# Patient Record
Sex: Male | Born: 1965 | Race: White
Health system: Northeastern US, Academic
[De-identification: ages and names within clinical notes are randomized; demographics above are authoritative.]

## PROBLEM LIST (undated history)

## (undated) DIAGNOSIS — J45909 Unspecified asthma, uncomplicated: Secondary | ICD-10-CM

## (undated) DIAGNOSIS — F32A Depression, unspecified: Secondary | ICD-10-CM

## (undated) DIAGNOSIS — Z9989 Dependence on other enabling machines and devices: Secondary | ICD-10-CM

## (undated) DIAGNOSIS — G4730 Sleep apnea, unspecified: Secondary | ICD-10-CM

## (undated) DIAGNOSIS — Z8711 Personal history of peptic ulcer disease: Secondary | ICD-10-CM

## (undated) DIAGNOSIS — F319 Bipolar disorder, unspecified: Secondary | ICD-10-CM

## (undated) DIAGNOSIS — G5600 Carpal tunnel syndrome, unspecified upper limb: Secondary | ICD-10-CM

## (undated) DIAGNOSIS — R519 Headache, unspecified: Secondary | ICD-10-CM

## (undated) DIAGNOSIS — I10 Essential (primary) hypertension: Secondary | ICD-10-CM

## (undated) HISTORY — DX: Depression, unspecified: F32.A

## (undated) HISTORY — DX: Personal history of peptic ulcer disease: Z87.11

## (undated) HISTORY — DX: Headache, unspecified: R51.9

## (undated) HISTORY — DX: Carpal tunnel syndrome, unspecified upper limb: G56.00

---

## 2004-10-27 ENCOUNTER — Encounter: Payer: Self-pay | Admitting: Cardiology

## 2006-12-31 ENCOUNTER — Encounter: Payer: Self-pay | Admitting: Cardiovascular Disease

## 2007-11-28 DIAGNOSIS — J45909 Unspecified asthma, uncomplicated: Secondary | ICD-10-CM | POA: Insufficient documentation

## 2007-11-28 DIAGNOSIS — E039 Hypothyroidism, unspecified: Secondary | ICD-10-CM | POA: Insufficient documentation

## 2007-11-28 DIAGNOSIS — F309 Manic episode, unspecified: Secondary | ICD-10-CM | POA: Insufficient documentation

## 2008-07-11 ENCOUNTER — Encounter: Payer: Self-pay | Admitting: Cardiology

## 2008-12-08 DIAGNOSIS — G8929 Other chronic pain: Secondary | ICD-10-CM | POA: Insufficient documentation

## 2009-02-11 DIAGNOSIS — F17200 Nicotine dependence, unspecified, uncomplicated: Secondary | ICD-10-CM | POA: Insufficient documentation

## 2009-02-11 DIAGNOSIS — K219 Gastro-esophageal reflux disease without esophagitis: Secondary | ICD-10-CM | POA: Insufficient documentation

## 2009-03-13 DIAGNOSIS — M542 Cervicalgia: Secondary | ICD-10-CM | POA: Insufficient documentation

## 2009-03-13 DIAGNOSIS — R0602 Shortness of breath: Secondary | ICD-10-CM | POA: Insufficient documentation

## 2009-04-02 DIAGNOSIS — M25559 Pain in unspecified hip: Secondary | ICD-10-CM | POA: Insufficient documentation

## 2009-08-06 DIAGNOSIS — M549 Dorsalgia, unspecified: Secondary | ICD-10-CM | POA: Insufficient documentation

## 2009-09-03 ENCOUNTER — Ambulatory Visit
Admit: 2009-09-03 | Discharge: 2009-09-03 | Disposition: A | Discharge status: Home / Self Care | Payer: Self-pay | Source: Ambulatory Visit | Attending: Primary Care | Admitting: Primary Care

## 2009-09-18 ENCOUNTER — Ambulatory Visit
Admit: 2009-09-18 | Discharge: 2009-09-18 | Disposition: A | Discharge status: Home / Self Care | Payer: Self-pay | Source: Ambulatory Visit | Attending: Primary Care | Admitting: Primary Care

## 2009-09-22 ENCOUNTER — Ambulatory Visit: Payer: Self-pay | Admitting: Orthopedic Surgery

## 2009-09-22 ENCOUNTER — Encounter: Payer: Self-pay | Admitting: Cardiology

## 2009-10-02 ENCOUNTER — Ambulatory Visit: Payer: Self-pay | Admitting: Primary Care

## 2009-10-09 ENCOUNTER — Ambulatory Visit
Admit: 2009-10-09 | Disposition: A | Discharge status: Home / Self Care | Payer: Self-pay | Source: Ambulatory Visit | Attending: Orthopedic Surgery | Admitting: Orthopedic Surgery

## 2009-10-13 ENCOUNTER — Ambulatory Visit: Payer: Self-pay | Admitting: Primary Care

## 2009-10-13 LAB — SURGICAL PATHOLOGY

## 2009-10-20 ENCOUNTER — Ambulatory Visit: Payer: Self-pay | Admitting: Orthopedic Surgery

## 2009-10-22 NOTE — Progress Notes (Signed)
 HISTORY OF PRESENTING ILLNESS: Mr. Timothy French returns for a planned postop  visit.  He is now 2 weeks status post exploration left iliac crest bone  graft harvest site with excision of heterotopic ossification.  Overall, he  reports it is too early to note any significant improvement.  He has been  having a great deal of pain.  He is using hydrocodone 10/325 up to 8 to 10  tablets daily.  He reports quite a bit of bleeding initially upon discharge  from the hospital requiring frequent dressing changes.  The drainage has  since slowed down over the past 2 days.  He denies any fevers, chills, or  sweats.  The drainage was bloody, and he denies any purulent or foul  smelling  material.    PHYSICAL EXAMINATION: His incision is well approximated.  There is a small  amount of erythema but no drainage and no suggestion of infection or  cellulitis.    PLAN: Given the erythema, I have placed the patient on a short course of  Keflex.  He was given a refill of his hydrocodone 10/325 #240.  He will  follow up with Dr. Isabell Jarvis as planned in 1 month. No additional x-rays will  be required at that visit.                                                                   Dictated by:                                                             Lowella Grip,                                                             NP  Electronically Signed and Finalized by  Lowella Grip, NP 10/22/2009 12:50                                                                                                                       ___________________________________________                                                             Lowella Grip,  NP  DD:   10/20/2009  DT:   10/20/2009  6:11 P  RU/EAV#[id number]  [id number]    cc:   Madolyn Frieze, MD

## 2009-11-04 ENCOUNTER — Ambulatory Visit: Payer: Self-pay | Admitting: Primary Care

## 2009-11-04 ENCOUNTER — Encounter: Payer: Self-pay | Admitting: Gastroenterology

## 2009-11-05 LAB — GRAM STAIN: Gram Stain: 0

## 2009-11-05 NOTE — Progress Notes (Addendum)
Reason For Visit   Timothy French is a 43 year old who presents to the office for follow-up for:   meds.  RN Intake Assessment   --PAIN: Patient  acknowledges pain in the last week.       --If yes, 0-10 pain rating: 7        --If pain rating > 3; location:hips       --Duration of pain: 1 month        --Aggravating factors: stairs, laying on it        --Relieving factors: denies.  HPI   Iliac Crest Pain  --Still having pain over left ilica crest.  Pain present for more than 1   year but new pain present for approx 1 month since surgery  --reports pain located at incisional site  --Incision site prev red with yellowish discharge.  Treated with keflex -   erythema improved but not resolved.   Yellow discharge improved.  Still has   a few keflex left  --No associated fevers  --Pain character -numb.     --Reports numbness in lower back - radiate to iliac crest region  --Overall, pain somewhat improved  --using vicodin - 2-3tabs/day\  --reports that vicodin prescribed by surgery was recently 'lost'---     Medications reviewed  .  Allergies   Bee sting  Latex-asked/denied  No Known Drug Allergy.  Current Meds   Divalproex Sodium 500 MG Tablet Delayed Release;TAKE 2 TABLETS IN AM, 3   TABLETS IN EVENING--pt. gets through [facility]; Rx  Mirtazapine 30 MG Tablet;TAKE 1 TABLET AT BEDTIME--gets through [employer]; Rx  Risperidone 2 MG Tablet;TAKE 1 TABLET DAILY--gets through [facility]; Rx  SEROquel 300 MG Tablet;Take 2 tablets at bedtime, MDD=2 gets through [employer]; Rx  NexIUM 20 MG Capsule Delayed Release;TAKE 1 CAPSULE ONCE DAILY.; Rx  Ventolin HFA 108 (90 Base) MCG/ACT Aerosol Solution;INHALE 2 PUFFS EVERY 4   HOURS AS NEEDED FOR COUGH AND WHEEZE.; Rx  Levothyroxine Sodium 75 MCG Tablet;TAKE 1 TABLET DAILY.; Rx  Lisinopril 10 MG Tablet;TAKE 1 TABLET DAILY.; Rx  Meloxicam 7.5 MG Tablet;take 1 tablet by mouth twice a day if needed DO NOT   TAKE WITH NAPROXEN, IBUPROFEN OR ALEVE.; Rx  Cyclobenzaprine HCl  10 MG Tablet;TAKE 1 TABLET QHS AS NEEDED for muscle   spasms; Rx  Hydrocodone-Acetaminophen 10-325 MG Tablet;TAKE 1 TABLET TWICE DAILY PRN   pain; Rx pain  Hydrocodone-Acetaminophen 10-325 MG Tablet;1 - 2 tablets every 6 hours as   needed for pain MDD 8/ day; Rx  1-Medication Reconciliation;done; Rx.  Active Problems   Asthma (493.90)  Backache (724.5)  Benign Essential Hypertension (401.1)  Bipolar Disorder (296.00)  Cervical Vertebral Fusion  Cervicalgia (723.1)  Chronic Pain (338.29)  Difficulty Breathing (Dyspnea) (786.09)  Esophageal Reflux (530.81)  Hypothyroidism (244.9)  Joint Pain, Localized In The Hip (719.45)  Nicotine Dependence (305.1).  Vital Signs   Recorded by Greer,Megan on 04 Nov 2009 09:54 AM  BP:128/77,   HR: 101 b/min,   Weight: 177 lb.  Physical Exam   Well appearing.  no distress  Illiac crest - Incision clear and intact, healing well.  Small amount of   sebaceous, yellow material expressed from near incision site.  No warmth.  Assessment/Plan   1) Cellulitis/Wound Infection - Improving on abx  --cont abx  --culture from wound sent     2) Iliac Crest pain - hypertrophic bony formation s/p excision.  --Restart NSAIDS, meloxicam  --  f/u with ortho  --wean opioids     3) Pain Meds - Pain meds were in car that was recently towed.  Unable to   get pain meds out of car.  We agreed that we will wean off opioids by   January  --SW consulted to help contact impound lot  --If unable to obtain meds, will need to file a police report     4) Back pain - lumbar muscular strain  --Restart NSAIDs  --PT.  Signature   Electronically signed by: Madolyn Frieze  M.D.; 11/04/2009 4:45 PM EST.

## 2009-11-08 LAB — AEROBIC CULTURE

## 2009-11-12 ENCOUNTER — Ambulatory Visit: Payer: Self-pay | Admitting: Primary Care

## 2009-11-18 ENCOUNTER — Ambulatory Visit: Payer: Self-pay | Admitting: Primary Care

## 2009-11-23 ENCOUNTER — Ambulatory Visit: Payer: Self-pay | Admitting: Orthopedic Surgery

## 2010-03-18 ENCOUNTER — Ambulatory Visit: Payer: Self-pay | Admitting: Primary Care

## 2010-03-18 DIAGNOSIS — G5600 Carpal tunnel syndrome, unspecified upper limb: Secondary | ICD-10-CM | POA: Insufficient documentation

## 2010-03-18 LAB — COMPREHENSIVE METABOLIC PANEL
ALT: 20 U/L (ref 0–50)
AST: 20 U/L (ref 0–50)
Albumin: 4.5 g/dL (ref 3.5–5.2)
Alk Phos: 58 U/L (ref 40–130)
Anion Gap: 12 (ref 7–16)
Bilirubin,Total: 0.2 mg/dL (ref 0.0–1.2)
CO2: 25 mmol/L (ref 20–28)
Calcium: 8.9 mg/dL — ABNORMAL LOW (ref 9.0–10.3)
Chloride: 103 mmol/L (ref 96–108)
Creatinine: 1.28 mg/dL — ABNORMAL HIGH (ref 0.67–1.17)
GFR,Black: >59 *
GFR,Caucasian: >59 *
Glucose: 78 mg/dL (ref 74–106)
Lab: 20 mg/dL (ref 6–20)
Potassium: 4.2 mmol/L (ref 3.3–5.1)
Sodium: 140 mmol/L (ref 133–145)
Total Protein: 7.5 g/dL (ref 6.3–7.7)

## 2010-03-18 LAB — TSH: TSH: 2.59 u[IU]/mL (ref 0.27–4.20)

## 2010-03-18 LAB — T4, FREE: Free T4: 1.5 ng/dL (ref 0.9–1.7)

## 2010-03-18 LAB — VALPROIC ACID LEVEL, TOTAL: Valproic Acid: 84 ug/mL (ref 50–100)

## 2010-03-18 NOTE — Progress Notes (Addendum)
Reason For Visit   Timothy French is a 44 year old who presents to the office with complaints   of: Carpel Tunnel bothering him on both hands has beeen dropping things.  HPI   1) Wrist Pain  --Both hands going numb  --radiates up to elbow  --pain -- described as tingling and numbness  --4th, 5ht, and thumb are going number  --Decreased strength in both hands  --Had carpal tunnel in rt hand -- pt has had 2 surgery /releases  --Wore braces in past without help     2) ETOH  --no ETOH   --No other drugs  --spent past 6 months in jail for DWI     3) Pain near hip  --still has occ pain near incision  --pain with direct pressure     medications reviewed      .  Current Meds   Divalproex Sodium 500 MG Tablet Delayed Release;TAKE 2 TABLETS IN AM, 3   TABLETS IN EVENING--pt. gets through [facility]; Rx  Mirtazapine 30 MG Tablet;TAKE 1 TABLET AT BEDTIME--gets through [employer]; Rx  Risperidone 2 MG Tablet;TAKE 1 TABLET DAILY--gets through [facility]; Rx  NexIUM 20 MG Capsule Delayed Release;TAKE 1 CAPSULE ONCE DAILY.; Rx  Levothyroxine Sodium 75 MCG Tablet;TAKE 1 TABLET DAILY.; Rx  Meloxicam 7.5 MG Tablet;take 1 tablet by mouth twice a day if needed DO NOT   TAKE WITH NAPROXEN, IBUPROFEN OR ALEVE.; Rx  1-Medication Reconciliation;done; Rx  Sulfamethoxazole-TMP DS 800-160 MG Tablet;TAKE 1 TABLET TWICE DAILY.; Rx  Hydrocodone-Acetaminophen 10-325 MG Tablet;1 - 2 tablets every 6 hours as   needed for pain MDD 8/ day; Rx  Cyclobenzaprine HCl 10 MG Tablet;TAKE 1 TABLET QHS AS NEEDED for muscle   spasms; Rx  Hydrocodone-Acetaminophen 10-325 MG Tablet;TAKE 1 TABLET TWICE DAILY PRN   pain; Rx pain  Ventolin HFA 108 (90 Base) MCG/ACT Aerosol Solution;INHALE 2 PUFFS EVERY 4   HOURS AS NEEDED FOR COUGH AND WHEEZE.; Rx  SEROquel 300 MG Tablet;Take 2 tablets at bedtime, MDD=2 gets through [employer]; Rx  Lisinopril 10 MG Tablet;TAKE 1 TABLET DAILY.; Rx.  Active Problems   Asthma (493.90)  Backache (724.5)  Benign  Essential Hypertension (401.1)  Bipolar Disorder (296.00)  Cervical Vertebral Fusion  Cervicalgia (723.1)  Chronic Pain (338.29)  Difficulty Breathing (Dyspnea) (786.09)  Esophageal Reflux (530.81)  Hypothyroidism (244.9)  Joint Pain, Localized In The Hip (719.45)  Nicotine Dependence (305.1).  Vital Signs   Recorded by Mosie Epstein on 18 Mar 2010 08:17 AM  BP:136/72,   HR: 88 b/min,   Weight: 213 lb.  Physical Exam   Well appearing.  No distress  EXT -- no deformities.  5/5 strenght bilaterally in hands.  No wasting of   thenar or hypothenar emminence.  tinnels causes tingling in central palm   but not in fingers.   phalen negative.  nl ROM.  Assessment/Plan   1) Wrist pain -- concerning for possible peripheral neuropathy, although   not entirely expalined by median nerv compression  --check EMG  --Check thyroid studies  --cont levothyroxine  --cont neurontin  --check valproic acid level     2) Hip pain -- improving  --NSAID prn     3) ETOH -- discusses ETOH use.     4) Endo -- check tsh.  cont levothyroxine.  Signature   Electronically signed by: Madolyn Frieze  M.D.; 03/18/2010 8:43 AM EST.  Electronically signed by: Madolyn Frieze  M.D.; 03/18/2010 9:36  AM EST.

## 2010-04-08 ENCOUNTER — Ambulatory Visit: Payer: Self-pay | Admitting: Primary Care

## 2010-04-08 NOTE — Progress Notes (Signed)
Reason For Visit   Timothy French is a 44 year old who presents to the office for follow-up.  HPI   Wrist Pain  --PResent for months  --located in wrists and hands bilaterally  --Pain described as tingling and numbness   --radiates up to elbow along forearm  --Sx associated wtih some desreased strenght in wrists  --no injuries  --pmhx notable for 2 surgeical CT releases  --Wearing wrist braces at night  --no neck pain or radiation of pain from neck      medications reviewed      .  Allergies   Bee sting  Latex-asked/denied  No Known Drug Allergy.  Current Meds   Divalproex Sodium 500 MG Tablet Delayed Release;TAKE 2 TABLETS IN AM, 3   TABLETS IN EVENING--pt. gets through [facility]; Rx  Risperidone 2 MG Tablet;TAKE 1 TABLET DAILY--gets through [facility]; Rx  Levothyroxine Sodium 75 MCG Tablet;TAKE 1 TABLET DAILY.; Rx  Cyclobenzaprine HCl 10 MG Tablet;TAKE 1 TABLET QHS AS NEEDED for muscle   spasms; Rx  Lisinopril 10 MG Tablet;TAKE 1 TABLET DAILY.; Rx  Meloxicam 7.5 MG Tablet;take 1 tablet by mouth twice a day if needed DO NOT   TAKE WITH NAPROXEN, IBUPROFEN OR ALEVE.; Rx  1-Medication Reconciliation;done; Rx  Ventolin HFA 108 (90 Base) MCG/ACT Aerosol Solution;INHALE 2 PUFFS EVERY 4   HOURS AS NEEDED FOR COUGH AND WHEEZE.; Rx  SEROquel 300 MG Tablet;Take 2 tablets at bedtime, MDD=2 gets through [employer]; Rx  Mirtazapine 30 MG Tablet;TAKE 1 TABLET AT BEDTIME--gets through [employer]; Rx.  Active Problems   Asthma (493.90)  Backache (724.5)  Benign Essential Hypertension (401.1)  Bipolar Disorder (296.00)  Carpal Tunnel Syndrome (354.0)  Cervical Vertebral Fusion  Cervicalgia (723.1)  Chronic Pain (338.29)  Difficulty Breathing (Dyspnea) (786.09)  Esophageal Reflux (530.81)  Hypothyroidism (244.9)  Joint Pain, Localized In The Hip (719.45)  Nicotine Dependence (305.1).  Vital Signs   Recorded by Lillard Anes on 08 Apr 2010 01:03 PM  BP:132/81,   HR: 101 b/min,   Weight: 213 lb.  Assessment/Plan      Wrist pain --  Most c/w peripheral neuropathy.    --check nerve conduction studies  --reviewed thyroid studies  --cont levothyroxine  --cont neurontin        .  Signature   Electronically signed by: Madolyn Frieze  M.D.; 04/08/2010 8:16 PM EST.

## 2010-04-12 ENCOUNTER — Encounter: Payer: Self-pay | Admitting: Neurology

## 2010-04-22 ENCOUNTER — Ambulatory Visit: Payer: Self-pay | Admitting: Primary Care

## 2010-04-29 ENCOUNTER — Encounter: Payer: Self-pay | Admitting: Neurology

## 2010-05-06 ENCOUNTER — Ambulatory Visit: Payer: Self-pay | Admitting: Primary Care

## 2010-08-09 ENCOUNTER — Other Ambulatory Visit: Payer: Self-pay | Admitting: Primary Care

## 2010-08-09 ENCOUNTER — Ambulatory Visit: Payer: Self-pay | Admitting: Primary Care

## 2010-08-09 LAB — CBC AND DIFFERENTIAL
Baso # K/uL: 0 THOU/uL (ref 0.0–0.1)
Basophil %: 0.4 % (ref 0.2–1.2)
Eos # K/uL: 0.2 THOU/uL (ref 0.0–0.5)
Eosinophil %: 1.7 % (ref 0.8–7.0)
Hematocrit: 46 % (ref 40–51)
Hemoglobin: 15.5 g/dL (ref 13.7–17.5)
Lymph # K/uL: 3.5 THOU/uL (ref 1.3–3.6)
Lymphocyte %: 37.2 % (ref 21.8–53.1)
MCV: 92 fL (ref 79–92)
Mono # K/uL: 0.9 THOU/uL — ABNORMAL HIGH (ref 0.3–0.8)
Monocyte %: 9.2 % (ref 5.3–12.2)
Neut # K/uL: 4.8 THOU/uL (ref 1.8–5.4)
Platelets: 298 THOU/uL (ref 150–330)
RBC: 5 MIL/uL (ref 4.6–6.1)
RDW: 13.5 % (ref 11.6–14.4)
Seg Neut %: 51.5 % (ref 34.0–67.9)
WBC: 9.4 THOU/uL — ABNORMAL HIGH (ref 4.2–9.1)

## 2010-08-09 LAB — COMPREHENSIVE METABOLIC PANEL
ALT: 18 U/L (ref 0–50)
AST: 26 U/L (ref 0–50)
Albumin: 4.4 g/dL (ref 3.5–5.2)
Alk Phos: 60 U/L (ref 40–130)
Anion Gap: 8 (ref 7–16)
Bilirubin,Total: 0.5 mg/dL (ref 0.0–1.2)
CO2: 28 mmol/L (ref 20–28)
Calcium: 9.7 mg/dL (ref 9.0–10.3)
Chloride: 103 mmol/L (ref 96–108)
Creatinine: 1.08 mg/dL (ref 0.67–1.17)
GFR,Black: >59 *
GFR,Caucasian: >59 *
Glucose: 70 mg/dL — ABNORMAL LOW (ref 74–106)
Lab: 8 mg/dL (ref 6–20)
Potassium: 4.5 mmol/L (ref 3.3–5.1)
Sodium: 139 mmol/L (ref 133–145)
Total Protein: 7.4 g/dL (ref 6.3–7.7)

## 2010-08-09 LAB — LIPASE: Lipase: 24 U/L (ref 13–60)

## 2010-08-09 NOTE — Progress Notes (Signed)
Reason For Visit   Timothy French is a 44 year old who presents to the office with complaints   of: vomiting, Diarrhea x 1 day.  HPI   1. GI- started having severe, nausea, vomiting and watery diarrhea in the   middle of the night. Has thrown up more than 10 times. Denies hematemesis   or bilious vomiting. Diarrhea has been watery but did have a few blood   streaks this morning after having to wipe multiple times. Has also had   associated RUQ pain but no hx of gallbladder disease. No report of pain   radiating to back or shoulder. Has been able to hold down minimal amounts   of clear liquids. Denies sick contacts or travel/camping hx. Has not eaten   at new restaurants and no new medication. Denies drug or alcohol use.     Medications reviewed.  Current Meds   Divalproex Sodium 500 MG Tablet Delayed Release;TAKE 2 TABLETS IN AM, 3   TABLETS IN EVENING--pt. gets through [facility]; Rx  Mirtazapine 30 MG Tablet;TAKE 1 TABLET AT BEDTIME--gets through [employer]; Rx  Risperidone 2 MG Tablet;TAKE 1 TABLET DAILY--gets through [facility]; Rx  Ventolin HFA 108 (90 Base) MCG/ACT Aerosol Solution;INHALE 2 PUFFS EVERY 4   HOURS AS NEEDED FOR COUGH AND WHEEZE.; Rx  Lisinopril 10 MG Tablet;TAKE 1 TABLET DAILY.; Rx  Levothyroxine Sodium 75 MCG Tablet;TAKE 1 TABLET DAILY.; Rx  Gabapentin 300 MG Capsule;TAKE 3 CAPSULEs in AM and 3 at BEDTIME; Rx  1-Medication Reconciliation;done; Rx  Cyclobenzaprine HCl 10 MG Tablet;TAKE 1 TABLET QHS AS NEEDED for muscle   spasms; Rx  Meloxicam 7.5 MG Tablet;take 1 tablet by mouth twice a day if needed DO NOT   TAKE WITH NAPROXEN, IBUPROFEN OR ALEVE.; Rx  SEROquel 300 MG Tablet;Take 2 tablets at bedtime, MDD=2 gets through [employer]; Rx.  Active Problems   Asthma (493.90)  Backache (724.5)  Benign Essential Hypertension (401.1)  Bipolar Disorder (296.00)  Carpal Tunnel Syndrome (354.0)  Cervical Vertebral Fusion  Cervicalgia (723.1)  Chronic Pain (338.29)  Difficulty Breathing  (Dyspnea) (786.09)  Esophageal Reflux (530.81)  Hypothyroidism (244.9)  Joint Pain, Localized In The Hip (719.45)  Nicotine Dependence (305.1).  Vital Signs   Recorded by [facility] on 09 Aug 2010 02:33 PM  BP:101/68,   HR: 104 b/min,   Temp: 98.2 F,   Height: 68 in, Weight: 193 lb, BMI: 29.3 kg/m2.  Physical Exam   GENERAL APPEARANCE: Appears tired but NAD  HEENT: PERRL, anicteric sclera, oropharynx moist  LUNGS: Clear to auscultation  HEART: Normal S1,S2 without murmur  ABDOMEN: NABS, soft, mild RUQ TTP but no G/R  .  Assessment/Plan   1. GI- probable viral gastroenteritis but concern with associated RUQ pain.   Cholelithiasis/cholelithiasis or PUD seems less likely but acute hepatitis   in differential. Will start with checking CBC, CMP and RUQ [place]. Will trial   Zofran for nausea. Pt aware he needs to go to ED if worsening pain or   inability to hold down fluids tonight. F/u with Dr. Jonny Ruiz in 1-2 days.  Signature   Electronically signed by: Marygrace Drought  MD Attend.; 08/09/2010 5:42 PM   EST; Chartered loss adjuster.

## 2010-08-11 ENCOUNTER — Emergency Department
Admit: 2010-08-11 | Disposition: A | Discharge status: Home / Self Care | Payer: Self-pay | Source: Ambulatory Visit | Attending: Emergency Medicine | Admitting: Emergency Medicine

## 2010-08-11 ENCOUNTER — Encounter: Payer: Self-pay | Admitting: Emergency Medicine

## 2010-08-11 HISTORY — DX: Unspecified asthma, uncomplicated: J45.909

## 2010-08-11 HISTORY — DX: Essential (primary) hypertension: I10

## 2010-08-11 HISTORY — DX: Bipolar disorder, unspecified: F31.9

## 2010-08-11 LAB — CBC AND DIFFERENTIAL
Baso # K/uL: 0 THOU/uL (ref 0.0–0.1)
Basophil %: 0.5 % (ref 0.2–1.2)
Eos # K/uL: 0.1 THOU/uL (ref 0.0–0.5)
Eosinophil %: 1.2 % (ref 0.8–7.0)
Hematocrit: 41 % (ref 40–51)
Hemoglobin: 14.2 g/dL (ref 13.7–17.5)
Lymph # K/uL: 2.6 THOU/uL (ref 1.3–3.6)
Lymphocyte %: 40.5 % (ref 21.8–53.1)
MCV: 91 fL (ref 79–92)
Mono # K/uL: 0.6 THOU/uL (ref 0.3–0.8)
Monocyte %: 8.5 % (ref 5.3–12.2)
Neut # K/uL: 3.2 THOU/uL (ref 1.8–5.4)
Platelets: 231 THOU/uL (ref 150–330)
RBC: 4.5 MIL/uL — ABNORMAL LOW (ref 4.6–6.1)
RDW: 13.5 % (ref 11.6–14.4)
Seg Neut %: 49 % (ref 34.0–67.9)
WBC: 6.5 THOU/uL (ref 4.2–9.1)

## 2010-08-11 LAB — COMPREHENSIVE METABOLIC PANEL
ALT: 12 U/L (ref 0–50)
AST: 15 U/L (ref 0–50)
Albumin: 4.1 g/dL (ref 3.5–5.2)
Alk Phos: 57 U/L (ref 40–130)
Anion Gap: 10 (ref 7–16)
Bilirubin,Total: 0.3 mg/dL (ref 0.0–1.2)
CO2: 25 mmol/L (ref 20–28)
Calcium: 9.4 mg/dL (ref 9.0–10.3)
Chloride: 102 mmol/L (ref 96–108)
Creatinine: 0.96 mg/dL (ref 0.67–1.17)
GFR,Black: >59 *
GFR,Caucasian: >59 *
Globulin: 2.7 g/dL (ref 2.7–4.3)
Glucose: 94 mg/dL (ref 74–106)
Lab: 8 mg/dL (ref 6–20)
Potassium: 4 mmol/L (ref 3.3–5.1)
Sodium: 137 mmol/L (ref 133–145)
Total Protein: 6.8 g/dL (ref 6.3–7.7)

## 2010-08-11 LAB — TYPE AND SCREEN
ABO RH Blood Type: O POS
Antibody Screen: NEGATIVE

## 2010-08-11 LAB — LIPASE: Lipase: 24 U/L (ref 13–60)

## 2010-08-11 LAB — AMYLASE: Amylase: 50 U/L (ref 28–100)

## 2010-08-11 MED ORDER — HYDROCODONE-ACETAMINOPHEN 5-500 MG PO TABS
1.0000 | ORAL_TABLET | Freq: Four times a day (QID) | ORAL | Status: AC | PRN
Start: 2010-08-11 — End: 2010-08-18

## 2010-08-11 MED ORDER — SODIUM CHLORIDE 0.9 % IV SOLN WRAPPED *I*
125.0000 mL/h | Status: DC
Start: 2010-08-11 — End: 2010-08-12

## 2010-08-11 MED ORDER — SODIUM CHLORIDE 0.9 % IV BOLUS *I*
1000.0000 mL | Status: DC
Start: 2010-08-11 — End: 2010-08-12
  Administered 2010-08-11: 1000 mL via INTRAVENOUS

## 2010-08-11 MED ORDER — ONDANSETRON 4 MG PO TBDP *I*
4.0000 mg | ORAL_TABLET | Freq: Three times a day (TID) | ORAL | Status: AC | PRN
Start: 2010-08-11 — End: 2010-08-18

## 2010-08-11 NOTE — ED Provider Notes (Signed)
History   Chief Complaint   Patient presents with   . GI Problem       HPI Comments: Vomiting diarrhea since Monday now with blood Bright red blood rectally Has recurrent vomiting When vomiting settled he tried a cheese burger which caused right upper quadrant pain and vomiting    Patient is a 44 y.o. male presenting with abdominal pain. The history is provided by the patient.   Abdominal Pain  The primary symptoms of the illness include abdominal pain, nausea, vomiting and diarrhea. The primary symptoms of the illness do not include fever, fatigue, shortness of breath, hematemesis, hematochezia or dysuria. Episode onset: days. The onset of the illness was gradual. The problem has been gradually worsening.   Onset: days. The pain came on gradually. The abdominal pain has been gradually worsening since its onset. The abdominal pain is located in the RUQ. The abdominal pain does not radiate. The severity of the abdominal pain is 3/10. Nothing relieves the abdominal pain. The abdominal pain is exacerbated by eating.   Associated With: fatty food. Additional symptoms associated with the illness include anorexia. Symptoms associated with the illness do not include chills, diaphoresis, heartburn, constipation, urgency, hematuria, frequency or back pain. Significant associated medical issues do not include PUD, GERD, inflammatory bowel disease, diabetes, sickle cell disease, gallstones, liver disease, substance abuse, diverticulitis, HIV or cardiac disease.       Past Medical History   Diagnosis Date   . Bipolar affective    . Asthma    . Hypertension        Past Surgical History   Procedure Date   . Back surgery      cervical fusion       Family History   Problem Relation Age of Onset   . Other Paternal Grandfather      mental illness   . Hypertension Other    . Mental illness Other    . Other Other      thyroid        reports that he has been smoking.  He does not have any smokeless tobacco history on file.  He reports  that he does not currently drink alcohol.    Review of Systems   Review of Systems   Constitutional: Negative for fever, chills, diaphoresis, activity change, appetite change, fatigue and unexpected weight change.   HENT: Negative for ear pain, nosebleeds, sore throat, facial swelling, rhinorrhea, mouth sores, trouble swallowing, neck pain, neck stiffness, voice change and ear discharge.    Eyes: Negative for discharge and visual disturbance.   Respiratory: Negative for cough, choking, chest tightness, shortness of breath and wheezing.    Cardiovascular: Negative for chest pain, palpitations and leg swelling.   Gastrointestinal: Positive for nausea, vomiting, abdominal pain, diarrhea and anorexia. Negative for heartburn, constipation, hematochezia, abdominal distention and hematemesis.   Genitourinary: Negative for dysuria, urgency, frequency, hematuria, flank pain and difficulty urinating.   Musculoskeletal: Negative for back pain.   Skin: Negative.    Neurological: Negative for dizziness, seizures, syncope, speech difficulty, weakness, light-headedness and headaches.   Hematological: Negative.    Psychiatric/Behavioral: Negative.        Physical Exam   BP 110/61  Pulse 72  Temp(Src) 36.5 C (97.7 F) (Temporal)  Resp 18  Wt 88.451 kg (195 lb)  SpO2 96%    Physical Exam   Nursing note and vitals reviewed.  Constitutional: He is oriented to person, place, and time. He appears well-developed and well-nourished. No  distress.        Seems comfortable but says severe pain with palpation gall bladder area   HENT:   Head: Normocephalic and atraumatic.   Right Ear: External ear and ear canal normal.   Left Ear: External ear and ear canal normal.   Nose: Nose normal.   Mouth/Throat: Oropharynx is clear and moist.   Eyes: Conjunctivae and EOM are normal. Pupils are equal, round, and reactive to light. Right eye exhibits no discharge. Left eye exhibits no discharge. No scleral icterus.   Neck: Normal range of motion.  Neck supple. No tracheal deviation present. No thyromegaly present.   Cardiovascular: Normal rate, regular rhythm, normal heart sounds and intact distal pulses.    No murmur heard.  Pulmonary/Chest: Effort normal and breath sounds normal. No stridor. No respiratory distress. He exhibits no tenderness.   Abdominal: Soft. Bowel sounds are normal. He exhibits no distension. No tenderness. He has no rebound and no guarding.   Genitourinary: Penis normal.   Musculoskeletal: Normal range of motion. He exhibits no edema.   Lymphadenopathy:     He has no cervical adenopathy.   Neurological: He is alert and oriented to person, place, and time.   Skin: Skin is warm and dry. He is not diaphoretic.   Psychiatric: He has a normal mood and affect. His behavior is normal. Judgment and thought content normal.       Medical Decision Making   MDM  Number of Diagnoses or Management Options  Diagnosis management comments: Patient seen by me today, 08/11/2010 at the time of arrival 6:56 PM    Assessment:  44 y.o., male comes to the ED with abdominal painRUQ  Differential Diagnosis includes biliary colic v colitis  Plan: labs imaging stool cultures       Amount and/or Complexity of Data Reviewed  Clinical lab tests: ordered and reviewed  Tests in the radiology section of CPT: ordered and reviewed  Discussion of test results with the performing providers: yes  Decide to obtain previous medical records or to obtain history from someone other than the patient: yes  Obtain history from someone other than the patient: yes  Review and summarize past medical records: yes  Discuss the patient with other providers: yes  Independent visualization of images, tracings, or specimens: yes      Labs Reviewed   CBC AND DIFFERENTIAL - Abnormal; Notable for the following:    . RBC 4.5 (*)     All other components within normal limits    Narrative:     Transfusion done anywhere within the last 3 months: No   COMPREHENSIVE METABOLIC PANEL    Narrative:      Transfusion done anywhere within the last 3 months: No   AMYLASE    Narrative:     Transfusion done anywhere within the last 3 months: No   LIPASE    Narrative:     Transfusion done anywhere within the last 3 months: No   TYPE AND SCREEN    Narrative:     Transfusion done anywhere within the last 3 months: No   HOLD GREEN WITH GEL   HOLD BLUE   AEROBIC CULTURE     no diarrhea or vomiting here Plan OP management with Vicodin and Zofran ODT    Jeremias Broyhill Madilyn Hook, MD      Almetta Lovely, MD  08/11/10 2307

## 2010-08-11 NOTE — ED Notes (Signed)
Patient is resting comfortably. 

## 2010-08-11 NOTE — Discharge Instructions (Signed)
Tylenol 650 mg  Or Vicodin four times daily if needed  no alcohol no driving with this medication  Zofran oral dissolving tablet     Push fluids  Follow with your MD   Return as needed

## 2010-08-11 NOTE — ED Notes (Signed)
Pt has had nausea and vomiting for 3 days, started having blood stool with dark red blood yesterday and vomiting blood today. Seen by pmd on mon and started on zofran for n/v, also being worked up for ruq pain u/s scheduled for fri. Lightheaded and also has a headache

## 2010-08-13 ENCOUNTER — Ambulatory Visit
Admit: 2010-08-13 | Discharge: 2010-08-13 | Disposition: A | Discharge status: Home / Self Care | Payer: Self-pay | Source: Ambulatory Visit | Attending: Primary Care | Admitting: Primary Care

## 2010-08-20 ENCOUNTER — Ambulatory Visit: Payer: Self-pay | Admitting: Primary Care

## 2010-08-20 DIAGNOSIS — I10 Essential (primary) hypertension: Secondary | ICD-10-CM | POA: Insufficient documentation

## 2010-08-20 NOTE — Progress Notes (Signed)
 Reason For Visit   Timothy French is a 44 year old who presents to the office for follow-up for:   Ultrasound review, and presription refills.  HPI   ABD PAIN  --Vomiting blood 2 weeks ago  --ABd [place] recently performed in ED  --Still having RUQ tenderness  --Nauseous originally, but now improved  --RUQ pain -- intensity 7/10  --Abd pain radiate to flank  --Describes as sharp pain, comes and goes, intermittent  --Has not noticed whether foods make it worse  --Has had diarrhea now for 2 weeks  --Some blood in stool  --No vomiting  --No fevers  --Now starting to cough  --scratchy throat  --no urinary symptoms  --significant cough  --no SOB  --cough nonproductive  --pain worse with cough     Medications reviewed.  Allergies   Bee sting  Latex-asked/denied  No Known Drug Allergy.  Current Meds   Divalproex Sodium 500 MG Tablet Delayed Release;TAKE 2 TABLETS IN AM, 3   TABLETS IN EVENING--pt. gets through [facility]; Rx  Mirtazapine 30 MG Tablet;TAKE 1 TABLET AT BEDTIME--gets through [employer]; Rx  Risperidone 2 MG Tablet;TAKE 1 TABLET DAILY--gets through [facility]; Rx  SEROquel 300 MG Tablet;Take 2 tablets at bedtime, MDD=2 gets through [employer]; Rx  Ventolin HFA 108 (90 Base) MCG/ACT Aerosol Solution;INHALE 2 PUFFS EVERY 4   HOURS AS NEEDED FOR COUGH AND WHEEZE.; Rx  Lisinopril 10 MG Tablet;TAKE 1 TABLET DAILY.; Rx  1-Medication Reconciliation;done; Rx  Levothyroxine Sodium 75 MCG Tablet;TAKE 1 TABLET DAILY.; Rx  Gabapentin 300 MG Capsule;TAKE 3 CAPSULEs in AM and 3 at BEDTIME; Rx  Ondansetron HCl 4 MG Tablet;TAKE 1 TABLET EVERY 8 HOURS AS NEEDED FOR   NAUSEA AND VOMITING.; Rx  Daily Vites Tablet;take 1 tablet by mouth once daily; RPT  Omeprazole 20 MG Tablet Delayed Release;take 1 tablet by mouth once daily;   RPT  Oxycodone-Acetaminophen 5-325 MG Tablet;take 1 to 2 tablets by mouth every   4 to 6 hours if needed for pain max; RPT.  Active Problems   Asthma (493.90)  Backache (724.5)  Benign  Essential Hypertension (401.1)  Bipolar Disorder (296.00)  Carpal Tunnel Syndrome (354.0)  Cervical Vertebral Fusion  Cervicalgia (723.1)  Chronic Pain (338.29)  Difficulty Breathing (Dyspnea) (786.09)  Esophageal Reflux (530.81)  Hypothyroidism (244.9)  Joint Pain, Localized In The Hip (719.45)  Nicotine Dependence (305.1).  Vital Signs   Recorded by Artist Pais on 20 Aug 2010 03:15 PM  BP:131/86,   HR: 96 b/min,   Weight: 188 lb.  Physical Exam   Well appeairng  Chest wall -- tender to pressure on left  COR -- RRR, no m  RESP -- CTA  ABD--BS present, soft, NT, ND, no masses  .  Assessment/Plan   1) Chest Wall Pain -- Secodnary to cough and viral illness  --Rec NSAIDs -- meloxicam prescribed  --Short course of vicodin for pain     2) Abd pain -- related to chest wall tenderness.  Review abd [place] and ED labs   with pt.  no evidence of hepatitis or biliary etiology     .  Signature   Electronically signed by: Madolyn Frieze  M.D.; 08/20/2010 5:45 PM EST.

## 2010-09-03 ENCOUNTER — Ambulatory Visit: Payer: Self-pay | Admitting: Primary Care

## 2010-09-29 ENCOUNTER — Ambulatory Visit: Payer: Self-pay | Admitting: Primary Care

## 2010-09-29 NOTE — Progress Notes (Signed)
 Reason For Visit   Timothy French is a 44 year old who presents to the office with complaints   of: sore throat, cough, congestion, denies fevers.  HPI   1) Sore-Throat  --ST present for several days  --felt sweaty last night, feverish for several days  --Rare cough  --some nasal congestion and rhinorrhea -- but not too bad  --reports sig cervical lymphadenopathy     2) mental health  --planning on going into house  --on MH meds through recent inpatient stay  --need refills on medication; will see psych in approc 1 month  --no SI     3) HTN  --no CP, no SOB  --not on lisinopril     medications reviewed.  Current Meds   Ondansetron 4 MG Tablet Dispersible;take 1 tablet by mouth every 8 hours if   needed for nausea PLACE ON TOP OF TONGUE TO DISSOLVE; RPT  Meloxicam 7.5 MG Tablet;take 1 tablet by mouth twice a day if needed DO NOT   TAKE WITH NAPROXEN, IBUPROFEN OR ALEVE.; Rx  Lisinopril 10 MG Tablet;TAKE 1 TABLET DAILY.; Rx  Levothyroxine Sodium 75 MCG Tablet;TAKE 1 TABLET DAILY.; Rx  Omeprazole 20 MG Tablet Delayed Release;take 1 tablet by mouth once daily;   RPT  Daily Vites Tablet;take 1 tablet by mouth once daily; RPT  Ondansetron HCl 4 MG Tablet;TAKE 1 TABLET EVERY 8 HOURS AS NEEDED FOR   NAUSEA AND VOMITING.; Rx  Risperidone 2 MG Tablet;TAKE 1 TABLET DAILY--gets through [facility]; Rx  Divalproex Sodium 500 MG Tablet Delayed Release;TAKE 2 TABLETS IN AM, 3   TABLETS IN EVENING--pt. gets through [facility]ages; Rx  Mirtazapine 30 MG Tablet;TAKE 1 TABLET AT BEDTIME--gets through evelyn   Brandon; Rx  Ventolin HFA 108 (90 Base) MCG/ACT Aerosol Solution;INHALE 2 PUFFS EVERY 4   HOURS AS NEEDED FOR COUGH AND WHEEZE.; Rx  1-Medication Reconciliation;done; Rx  SEROquel 300 MG Tablet;Take 2 tablets at bedtime, MDD=2 gets through [employer]; Rx  Gabapentin 300 MG Capsule;TAKE 3 CAPSULEs in AM and 3 at BEDTIME; Rx.  Active Problems   ACP Staging Stage 1 Hypertension: 140-159 / 90-99 (401.9)  Asthma  (493.90)  Backache (724.5)  Benign Essential Hypertension (401.1)  Bipolar Disorder (296.00)  Carpal Tunnel Syndrome (354.0)  Cervical Vertebral Fusion  Cervicalgia (723.1)  Chronic Pain (338.29)  Difficulty Breathing (Dyspnea) (786.09)  Esophageal Reflux (530.81)  Hypothyroidism (244.9)  Joint Pain, Localized In The Hip (719.45)  Nicotine Dependence (305.1).  POCT   RAPID STREP RESULT:  September 29, 2010   9:38AM  Rapid Antigen: negative. (positive, negative, invalid)  Reference range: Negative.  Vital Signs   Recorded by Lillard Anes on 29 Sep 2010 08:49 AM  BP:112/76,   HR: 101 b/min,   Weight: 184 lb.  Physical Exam   No distress, uncomfortable appearing  HEENT --Neck supple, ant cervical LAD noted.  Post pharynx injected with   small amnt of white exudate.   TM with effusions.  Nasal mucosa boggy with   clear drainage.  COR -- RRR, no m  RESP -- Coarse BS, no focality.     .  Assessment/Plan   1) Sore-Throat  --Will treat with amox x 10 days based on tonsillar exudated, fever, LAD,   and lack of sig cough  --throat lozenges     2) mental health -- stable  --refilled medications   --rec f/u and re-establishing care with psych     3)  HYPERTENSION  --According to JNC 7  guidelines target BP: less than 140/90 patient   currently is : at goal  Plan to reach goal includes:  Lifestyle modifications  ;discussed DASH eating plan      --Medication management: no changes made       --Self-management tool provided  NO  --Referral for Care Management: yes  --seen by emily  --Follow up in 1 months.  Signature   Electronically signed by: Madolyn Frieze  M.D.; 09/29/2010 9:52 AM EST.

## 2010-10-04 ENCOUNTER — Ambulatory Visit
Admit: 2010-10-04 | Discharge: 2010-10-04 | Disposition: A | Discharge status: Home / Self Care | Payer: Self-pay | Source: Ambulatory Visit | Attending: Psychiatry | Admitting: Psychiatry

## 2010-10-04 LAB — CBC AND DIFFERENTIAL
Baso # K/uL: 0 THOU/uL (ref 0.0–0.1)
Basophil %: 0.3 % (ref 0.2–1.2)
Eos # K/uL: 0 THOU/uL (ref 0.0–0.5)
Eosinophil %: 0.5 % — ABNORMAL LOW (ref 0.8–7.0)
Hematocrit: 43 % (ref 40–51)
Hemoglobin: 14.5 g/dL (ref 13.7–17.5)
Lymph # K/uL: 2.7 THOU/uL (ref 1.3–3.6)
Lymphocyte %: 41.1 % (ref 21.8–53.1)
MCV: 93 fL — ABNORMAL HIGH (ref 79–92)
Mono # K/uL: 0.7 THOU/uL (ref 0.3–0.8)
Monocyte %: 10.2 % (ref 5.3–12.2)
Neut # K/uL: 3.2 THOU/uL (ref 1.8–5.4)
Platelets: 247 THOU/uL (ref 150–330)
RBC: 4.6 MIL/uL (ref 4.6–6.1)
RDW: 14.4 % (ref 11.6–14.4)
Seg Neut %: 47.9 % (ref 34.0–67.9)
WBC: 6.7 THOU/uL (ref 4.2–9.1)

## 2010-10-04 LAB — ALT: ALT: 15 U/L (ref 0–50)

## 2010-10-04 LAB — VALPROIC ACID LEVEL, TOTAL: Valproic Acid: 63 ug/mL (ref 50–100)

## 2010-10-04 LAB — ALKALINE PHOSPHATASE: Alk Phos: 63 U/L (ref 40–130)

## 2010-10-04 LAB — AST: AST: 27 U/L (ref 0–50)

## 2010-10-07 ENCOUNTER — Ambulatory Visit: Payer: Self-pay | Admitting: Primary Care

## 2010-10-29 ENCOUNTER — Ambulatory Visit: Payer: Self-pay | Admitting: Primary Care

## 2010-11-25 ENCOUNTER — Ambulatory Visit: Payer: Self-pay | Admitting: Primary Care

## 2010-11-28 NOTE — Miscellaneous (Unsigned)
 Continuity of Care Record  Created: todo  From: [name], ROBERT  From:   From: [employer], EHR v10.2.7.53  To: Timothy French  Purpose: Patient Use;       Problems  Diagnosis: Asthma (493.90)   Diagnosis: Backache (724.5)   Diagnosis: Bipolar Disorder (296.00)   Diagnosis: Carpal Tunnel Syndrome (354.0)   Problem: Cervical Vertebral Fusion  Diagnosis: Cervicalgia (723.1)   Diagnosis: Chronic Pain (338.29)   Diagnosis: Difficulty Breathing (Dyspnea) (786.09)   Diagnosis: Esophageal Reflux (530.81)   Diagnosis: Hypertension (401.9)   Diagnosis: Hypothyroidism (244.9)   Diagnosis: Joint Pain, Localized In The Hip (719.45)   Diagnosis: Nicotine Dependence (305.1)     Social History  Tobacco Use (V15.82)     Alerts  Allergy - Bee sting   Allergy - Latex-asked/denied   Allergy - No Known Drug Allergy     Medications  1-Medication Reconciliation; done ; Rx   Baclofen 20 MG Tablet; take 1 tablet by mouth four times a day ; RPT   Chantix 1 MG Tablet; take 1 tablet by mouth twice a day maximum daily dose   of 2 ; RPT   Cyclobenzaprine HCl 10 MG Tablet; take 1 tablet by mouth at bedtime if   needed ; Rx   Daily Vites Tablet; take 1 tablet by mouth once daily ; RPT   Divalproex Sodium 500 MG Tablet Delayed Release; TAKE 2 TABLETS IN AM, 3   TABLETS IN EVENING--pt. gets through [facility] ; Rx   Hydrocodone-Acetaminophen 5-325 MG Tablet; TAKE 1 TO 2 TABLETS EVERY 4 TO 6   HOURS AS NEEDED FOR PAIN. ; Rx   Levothyroxine Sodium 75 MCG Tablet; TAKE 1 TABLET DAILY. ; Rx   Mirtazapine 30 MG Tablet; TAKE 1 TABLET AT BEDTIME--gets through [employer] ; Rx   Naproxen 500 MG Tablet; TAKE 1 TABLET TWICE DAILY AS NEEDED. ; Rx   Omeprazole 20 MG Tablet Delayed Release; take 1 tablet by mouth once daily   ; RPT   QUEtiapine Fumarate 300 MG Tablet; Take 2 tablets at bedtime, MDD=2 gets   through [employer] ; Rx   Risperidone 2 MG Tablet; TAKE 1 TABLET DAILY--gets through [facility] Mages ;    Rx   Ventolin HFA 108 (90 Base) MCG/ACT Aerosol Solution; INHALE 2 PUFFS EVERY 4   HOURS AS NEEDED FOR COUGH AND WHEEZE. ; Rx     Immunizations  Td   Influenza   Influenza   Influenza   Tdap (Adacel)

## 2010-12-17 ENCOUNTER — Ambulatory Visit: Payer: Self-pay | Admitting: Primary Care

## 2010-12-17 ENCOUNTER — Encounter: Payer: Self-pay | Admitting: Primary Care

## 2010-12-17 NOTE — Progress Notes (Signed)
Reason For Visit   ANTONNIO MCDAVID is a 45 year old who presents to the office with complaints   of: muscle strain.  RN Intake Assessment   --PAIN: Patient  acknowledges pain in the last week.       --If yes, 0-10 pain rating: 8        --If pain rating > 3; location: lower back        --Duration of pain: since yesterday        --Aggravating factors: sitting        --Relieving factors: laying on side,  HPI   Low Back Pain  -Yesterday was pulling a freezer off of a truck and pulled back  -Left lower area  -Today dog pulled him down 3 steps, landed on his tailbone, and worsened it  -Radiates down to mid hamstring on left  -No GI/GU control issues  -Character is 'numb' in the radiation area but the tailbone hurts 'a lot'   8/10  -Hot shower helped some  -Naprosyn hasn't helped     meds reviewed  .  Allergies   Bee sting  Latex-asked/denied  No Known Drug Allergy.  Current Meds   Levothyroxine Sodium 75 MCG Tablet;TAKE 1 TABLET DAILY.; Rx  Baclofen 20 MG Tablet;take 1 tablet by mouth four times a day; RPT  Chantix 1 MG Tablet;take 1 tablet by mouth twice a day maximum daily dose   of 2; RPT  Gabapentin 300 MG Capsule;TAKE 3 CAPSULEs in AM and 3 at BEDTIME; Rx  Mirtazapine 30 MG Tablet;TAKE 1 TABLET AT BEDTIME--gets through [employer]; Rx  SEROquel 300 MG Tablet;Take 2 tablets at bedtime, MDD=2 gets through [employer]; Rx  Risperidone 2 MG Tablet;TAKE 1 TABLET DAILY--gets through [facility]ages; Rx  1-Medication Reconciliation;done; Rx  Ventolin HFA 108 (90 Base) MCG/ACT Aerosol Solution;INHALE 2 PUFFS EVERY 4   HOURS AS NEEDED FOR COUGH AND WHEEZE.; Rx  Divalproex Sodium 500 MG Tablet Delayed Release;TAKE 2 TABLETS IN AM, 3   TABLETS IN EVENING--pt. gets through [facility] Mages; Rx  Ondansetron 4 MG Tablet Dispersible;take 1 tablet by mouth every 8 hours if   needed for nausea PLACE ON TOP OF TONGUE TO DISSOLVE; RPT  Daily Vites Tablet;take 1 tablet by mouth once daily; RPT  Omeprazole 20 MG Tablet Delayed  Release;take 1 tablet by mouth once daily;   RPT.  Active Problems   ACP Staging Stage 1 Hypertension: 140-159 / 90-99 (401.9)  Asthma (493.90)  Backache (724.5)  Bipolar Disorder (296.00)  Carpal Tunnel Syndrome (354.0)  Cervical Vertebral Fusion  Cervicalgia (723.1)  Chronic Pain (338.29)  Difficulty Breathing (Dyspnea) (786.09)  Esophageal Reflux (530.81)  Hypothyroidism (244.9)  Joint Pain, Localized In The Hip (719.45)  Nicotine Dependence (305.1).  Vital Signs   Recorded by Lillard Anes on 17 Dec 2010 02:06 PM  BP:139/83,   HR: 95 b/min,   Weight: 197 lb.  Physical Exam   AA walks slowly and gingerly because of back pain  Back: tender over LS muscles on left, no bruising  SLR pos for back pain bilat  KJ 2+ bilat, normal strength and sensation of both legs  Too painful to do much more exam.  Patient Profile   Health care proxy in chart.  Native language English.  No physical   disability was observed.  No visual impairment.  No hearing loss.  Counseling about end-of-life issues  MOLST  Assessment/Plan   Back Pain   -Muscular strain by history and  exam  -No signs fracture or radiculopathy  -Counseled and gave sheet for low back exercises  -Short course narcotic pain medications, cyclobenzaprine at night time  -Has FU scheduled in 2 weeks  -Note for work.  Signature   Electronically signed by: Hector Brunswick  MD Attend.; 12/17/2010 2:24 PM   EST.

## 2011-01-04 ENCOUNTER — Ambulatory Visit: Payer: Self-pay | Admitting: Primary Care

## 2011-03-15 ENCOUNTER — Ambulatory Visit: Payer: Self-pay | Admitting: Primary Care

## 2011-03-15 ENCOUNTER — Encounter: Payer: Self-pay | Admitting: Primary Care

## 2011-03-15 NOTE — Progress Notes (Signed)
 Reason For Visit   Timothy French is a 45 year old who presents to the office for follow-up for:   medications.  HPI   1) Back Pain  --Injured back while moving couch  --Pani present for approx 1 week  --Located in lumbar region  --radiates to hip where he previously had bone graft taken  --trying to heat pads, helps transiently  --still walking normal, gait OK  --worse with sitting or walking too long     2) mental health  --Missed appt with psych  --need refills on medication; will see psych in approx 1 month -- approx May  --no SI     3) HTN  --no CP, no SOB  --not any HTN, prev on lisinopril     medications reviewed.  Allergies   Bee sting  Latex-asked/denied  No Known Drug Allergy.  Current Meds   1-Medication Reconciliation;done; Rx  Levothyroxine Sodium 75 MCG Tablet;TAKE 1 TABLET DAILY.; Rx  Hydrocodone-Acetaminophen 5-325 MG Tablet;TAKE 1 TO 2 TABLETS EVERY 4 TO 6   HOURS AS NEEDED FOR PAIN.; Rx  Divalproex Sodium 500 MG Tablet Delayed Release;TAKE 2 TABLETS IN AM, 3   TABLETS IN EVENING--pt. gets through Jenelle Mages; Rx  Risperidone 2 MG Tablet;TAKE 1 TABLET DAILY--gets through [facility]ages; Rx  Cyclobenzaprine HCl 10 MG Tablet;take 1 tablet by mouth at bedtime if   needed; Rx  Mirtazapine 30 MG Tablet;TAKE 1 TABLET AT BEDTIME--gets through [employer]; Rx  SEROquel 300 MG Tablet;Take 2 tablets at bedtime, MDD=2 gets through [employer]; Rx  Chantix 1 MG Tablet;take 1 tablet by mouth twice a day maximum daily dose   of 2; RPT  Baclofen 20 MG Tablet;take 1 tablet by mouth four times a day; RPT  Omeprazole 20 MG Tablet Delayed Release;take 1 tablet by mouth once daily;   RPT  Daily Vites Tablet;take 1 tablet by mouth once daily; RPT  Ventolin HFA 108 (90 Base) MCG/ACT Aerosol Solution;INHALE 2 PUFFS EVERY 4   HOURS AS NEEDED FOR COUGH AND WHEEZE.; Rx.  Active Problems   ACP Staging Stage 1 Hypertension: 140-159 / 90-99 (401.9)  Asthma (493.90)  Backache (724.5)  Bipolar Disorder (296.00)  Carpal  Tunnel Syndrome (354.0)  Cervical Vertebral Fusion  Cervicalgia (723.1)  Chronic Pain (338.29)  Difficulty Breathing (Dyspnea) (786.09)  Esophageal Reflux (530.81)  Hypothyroidism (244.9)  Joint Pain, Localized In The Hip (719.45)  Nicotine Dependence (305.1).  POCT   RAPID STREP RESULT:  September 29, 2010   9:38AM  Rapid Antigen: negative. (positive, negative, invalid)  Reference range: Negative.  Vital Signs   Recorded by [facility] on 15 Mar 2011 09:17 AM  BP:149/78,   HR: 85 b/min,   Height: 67 in, Weight: 191 lb, BMI: 29.9 kg/m2.  Physical Exam   No distress, uncomfortable appearing  COR -- RRR, no m  RESP -- CTA  BACK -- TTP along paraspinal musculature bialterally.  no bony tenderness.     2+DTRs     .  Assessment/Plan       1) Back Pain -- Consistent with lumbar muscular strain.  no red flags  --rec NSAID  --short course of vicodin given, discussed not for long-term use     2) mental health -- bipolar, currently untreated   --restart psych meds  --refer back to psyhc     3) HTN -- repeat BP at goal  --cont current life style modifications  .  Signature   Electronically signed by: Molly Maduro  Edrick Kins  M.D.; 03/15/2011 11:01 AM EST.

## 2011-04-20 ENCOUNTER — Ambulatory Visit: Payer: Self-pay | Admitting: Primary Care

## 2011-10-10 ENCOUNTER — Other Ambulatory Visit: Payer: Self-pay | Admitting: Primary Care

## 2012-02-02 ENCOUNTER — Other Ambulatory Visit: Payer: Self-pay | Admitting: Primary Care

## 2012-08-14 ENCOUNTER — Ambulatory Visit: Payer: Self-pay | Admitting: Primary Care

## 2012-08-16 ENCOUNTER — Encounter: Payer: Self-pay | Admitting: Primary Care

## 2012-08-16 ENCOUNTER — Ambulatory Visit: Payer: Self-pay | Admitting: Primary Care

## 2012-08-16 VITALS — BP 148/88 | HR 93 | Ht 66.0 in | Wt 171.0 lb

## 2012-08-16 DIAGNOSIS — F319 Bipolar disorder, unspecified: Secondary | ICD-10-CM

## 2012-08-16 DIAGNOSIS — E039 Hypothyroidism, unspecified: Secondary | ICD-10-CM

## 2012-08-16 DIAGNOSIS — I10 Essential (primary) hypertension: Secondary | ICD-10-CM

## 2012-08-16 DIAGNOSIS — M545 Low back pain, unspecified: Secondary | ICD-10-CM

## 2012-08-16 DIAGNOSIS — J45909 Unspecified asthma, uncomplicated: Secondary | ICD-10-CM

## 2012-08-16 MED ORDER — QUETIAPINE FUMARATE 300 MG PO TABS *I*
ORAL_TABLET | ORAL | Status: DC
Start: 2012-08-16 — End: 2012-10-01

## 2012-08-16 MED ORDER — ALBUTEROL SULFATE HFA 108 (90 BASE) MCG/ACT IN AERS *I*
INHALATION_SPRAY | RESPIRATORY_TRACT | Status: DC
Start: 2012-08-16 — End: 2012-11-06

## 2012-08-16 MED ORDER — MIRTAZAPINE 30 MG PO TABS *I*
ORAL_TABLET | ORAL | Status: DC
Start: 2012-08-16 — End: 2012-12-06

## 2012-08-16 MED ORDER — TRAMADOL HCL 50 MG PO TABS *I*
50.0000 mg | ORAL_TABLET | Freq: Four times a day (QID) | ORAL | Status: DC | PRN
Start: 2012-08-16 — End: 2012-08-16

## 2012-08-16 MED ORDER — RISPERIDONE 2 MG PO TABS *I*
2.0000 mg | ORAL_TABLET | Freq: Every day | ORAL | Status: DC
Start: 2012-08-16 — End: 2012-11-06

## 2012-08-16 MED ORDER — DIVALPROEX SODIUM 500 MG PO TBEC *I*
DELAYED_RELEASE_TABLET | ORAL | Status: DC
Start: 2012-08-16 — End: 2013-01-02

## 2012-08-16 MED ORDER — HYDROCODONE-ACETAMINOPHEN 5-325 MG PO TABS *I*
1.0000 | ORAL_TABLET | Freq: Four times a day (QID) | ORAL | Status: AC | PRN
Start: 2012-08-16 — End: 2012-08-21

## 2012-08-16 MED ORDER — CYCLOBENZAPRINE HCL 10 MG PO TABS *I*
10.0000 mg | ORAL_TABLET | Freq: Three times a day (TID) | ORAL | Status: DC | PRN
Start: 2012-08-16 — End: 2012-10-04

## 2012-08-16 MED ORDER — LEVOTHYROXINE SODIUM 75 MCG PO TABS *I*
ORAL_TABLET | ORAL | Status: DC
Start: 2012-08-16 — End: 2012-12-06

## 2012-08-16 NOTE — Addendum Note (Signed)
Addended by: Madolyn Frieze on: 08/16/2012 04:29 PM     Modules accepted: Orders

## 2012-08-16 NOTE — Progress Notes (Signed)
~  [facility]~    SUBJECTIVE     1)Psych / Bipolar Disorder  --previously on depakote (500 2 in AMl; 3 in PM), Seroquel 600mg  qPM, remeron 30 every day, rispderdal 2 mg  --on same medication for past 10 years  --on treatment for bipolar, no mania, but has mood swings  --needs medications  --out off medications  --reports that mood has been unstable  --trying to get into mental health    2) Thyroid  --loosing weight  --out of thyroid medication    3) Asthma  --needs albuterol inhaler  --still smoking  --occ cough    4) Back  --twisted back recently, carrying TV  --pain located in rt lumbar region  --pain 8/10  --radiates into buttocks  --tried ibuprofen      Pain Intensity:   Pain    08/16/12 1303   PainSc:   8   PainLoc: Back         Medications Reviewed    OBJECTIVE     BP 148/88  Pulse 93  Ht 1.676 m (5\' 6" )  Wt 77.565 kg (171 lb)  BMI 27.61 kg/m2  General Appearance: No distress  COR: RRR, No Murmurs   Resp:  Respirations unlabored. Clear to auscultation bilaterally  Back:   TTP in rt lumbar region.   Gait normal.  ROM slightly decreased.   2+DTRs.      ASSESSMENT / DIAGNOSIS     1. Bipolar affective disorder  risperiDONE (RISPERDAL) 2 MG tablet, quetiapine (SEROQUEL) 300 MG tablet, divalproex (DEPAKOTE) 500 MG delayed release tablet   2. Asthma  albuterol (VENTOLIN HFA) 108 (90 BASE) MCG/ACT inhaler   3. Hypothyroidism  levothyroxine (SYNTHROID, LEVOTHROID) 75 MCG tablet, TSH   4. LBP (low back pain)  cyclobenzaprine (FLEXERIL) 10 MG tablet, HYDROcodone-acetaminophen (NORCO) 5-325 MG per tablet, DISCONTINUED: traMADol (ULTRAM) 50 MG tablet   5. HTN (hypertension)  Lipid panel, Comprehensive metabolic panel       ORDERS AND PLAN     Bipolar affective disorder  - risperiDONE (RISPERDAL) 2 MG tablet; Take 1 tablet (2 mg total) by mouth daily  - quetiapine (SEROQUEL) 300 MG tablet; Take 2 tablets at bedtime  - divalproex (DEPAKOTE) 500 MG delayed release tablet; TAKE 2 TABLETS IN AM, 3 TABLETS IN  EVENING-    Asthma  - albuterol (VENTOLIN HFA) 108 (90 BASE) MCG/ACT inhaler; INHALE 2 PUFFS EVERY 4 HOURS AS NEEDED FOR COUGH AND WHEEZE.    Hypothyroidism  - levothyroxine (SYNTHROID, LEVOTHROID) 75 MCG tablet; TAKE 1 TABLET DAILY.  - TSH; Future    LBP (low back pain)  - cyclobenzaprine (FLEXERIL) 10 MG tablet; Take 1 tablet (10 mg total) by mouth 3 times daily as needed for Muscle spasms  - Discontinue: traMADol (ULTRAM) 50 MG tablet; Take 1 tablet (50 mg total) by mouth every 6 hours as needed for Pain   MDD 200 mg  - HYDROcodone-acetaminophen (NORCO) 5-325 MG per tablet; Take 1 tablet by mouth every 6 hours as needed for Pain   MDD 4 tablets    HTN (hypertension)  - Lipid panel; Future  - Comprehensive metabolic panel; Future        --Patient instructed to call if symptoms are not improving or worsening  --Follow-up arranged    Signed: Madolyn Frieze, MD on 08/16/2012 at 3:44 PM  [facility]

## 2012-09-10 ENCOUNTER — Encounter: Payer: Self-pay | Admitting: Gastroenterology

## 2012-09-13 ENCOUNTER — Ambulatory Visit: Payer: Self-pay | Admitting: Primary Care

## 2012-09-20 ENCOUNTER — Telehealth: Payer: Self-pay | Admitting: Primary Care

## 2012-09-20 ENCOUNTER — Ambulatory Visit: Payer: Self-pay | Admitting: Primary Care

## 2012-09-20 NOTE — Telephone Encounter (Signed)
fortuna pt  Per the pt, he missed his appt today because he over slept. States that he really needs to be seen. Offered the pt an appt today at 3 pm-pt refused. Next avail is not until 12/18. The pt states that he will see anyone
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
## 2012-09-21 ENCOUNTER — Ambulatory Visit: Payer: Self-pay | Admitting: Critical Care Medicine

## 2012-10-01 ENCOUNTER — Other Ambulatory Visit: Payer: Self-pay | Admitting: Primary Care

## 2012-10-04 ENCOUNTER — Emergency Department: Admit: 2012-10-04 | Disposition: A | Discharge status: Home / Self Care | Payer: Self-pay | Source: Ambulatory Visit

## 2012-10-04 ENCOUNTER — Encounter: Payer: Self-pay | Admitting: Emergency Medicine

## 2012-10-04 MED ORDER — KETOROLAC TROMETHAMINE 30 MG/ML IJ SOLN *I*
60.0000 mg | Freq: Once | INTRAMUSCULAR | Status: AC
Start: 2012-10-04 — End: 2012-10-04

## 2012-10-04 MED ORDER — KETOROLAC TROMETHAMINE 30 MG/ML IJ SOLN *I*
INTRAMUSCULAR | Status: AC
Start: 2012-10-04 — End: 2012-10-04
  Administered 2012-10-04: 60 mg via INTRAMUSCULAR
  Filled 2012-10-04: qty 2

## 2012-10-04 MED ORDER — CYCLOBENZAPRINE HCL 10 MG PO TABS *I*
ORAL_TABLET | ORAL | Status: AC
Start: 2012-10-04 — End: 2012-10-04
  Administered 2012-10-04: 10 mg via ORAL
  Filled 2012-10-04: qty 1

## 2012-10-04 MED ORDER — CYCLOBENZAPRINE HCL 10 MG PO TABS *I*
10.0000 mg | ORAL_TABLET | Freq: Three times a day (TID) | ORAL | Status: AC | PRN
Start: 2012-10-04 — End: 2012-10-09

## 2012-10-04 MED ORDER — CYCLOBENZAPRINE HCL 10 MG PO TABS *I*
10.0000 mg | ORAL_TABLET | Freq: Once | ORAL | Status: AC
Start: 2012-10-04 — End: 2012-10-04

## 2012-10-04 MED ORDER — IBUPROFEN 600 MG PO TABS *I*
600.0000 mg | ORAL_TABLET | Freq: Four times a day (QID) | ORAL | Status: AC | PRN
Start: 2012-10-04 — End: 2012-10-09

## 2012-10-04 NOTE — Discharge Instructions (Signed)
Rest, ice to affected area every few hours for next 2 days

## 2012-10-04 NOTE — ED Notes (Addendum)
Pt dressed and used bathroom with no assistance from staff

## 2012-10-04 NOTE — ED Notes (Signed)
Pt walked to rme B from triage . States he had to carry a refrigerator upstairs today and lost his footing, now pain to right lower back. Pt also shares he had a slipped disc approximately 10 years ago from a work related injury. Pt able to stand twist at the waist, and walk. States he would like something for pain. Will continue to monitor
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
## 2012-10-05 ENCOUNTER — Encounter: Payer: Self-pay | Admitting: Primary Care

## 2012-10-05 ENCOUNTER — Ambulatory Visit: Payer: Self-pay | Admitting: Primary Care

## 2012-10-05 ENCOUNTER — Telehealth: Payer: Self-pay | Admitting: Primary Care

## 2012-10-05 VITALS — BP 128/76 | HR 83 | Ht 66.0 in | Wt 172.0 lb

## 2012-10-05 DIAGNOSIS — F319 Bipolar disorder, unspecified: Secondary | ICD-10-CM

## 2012-10-05 DIAGNOSIS — E039 Hypothyroidism, unspecified: Secondary | ICD-10-CM

## 2012-10-05 LAB — COMPREHENSIVE METABOLIC PANEL
ALT: 10 U/L (ref 0–50)
AST: 15 U/L (ref 0–50)
Albumin: 4.5 g/dL (ref 3.5–5.2)
Alk Phos: 56 U/L (ref 40–130)
Anion Gap: 8 (ref 7–16)
Bilirubin,Total: 0.4 mg/dL (ref 0.0–1.2)
CO2: 26 mmol/L (ref 20–28)
Calcium: 9.5 mg/dL (ref 8.6–10.2)
Chloride: 108 mmol/L (ref 96–108)
Creatinine: 1.11 mg/dL (ref 0.67–1.17)
GFR,Black: 91 *
GFR,Caucasian: 79 *
Glucose: 92 mg/dL (ref 60–99)
Lab: 15 mg/dL (ref 6–20)
Potassium: 4.7 mmol/L (ref 3.3–5.1)
Sodium: 142 mmol/L (ref 133–145)
Total Protein: 7.5 g/dL (ref 6.3–7.7)

## 2012-10-05 LAB — LIPID PANEL
Chol/HDL Ratio: 2.5
Cholesterol: 123 mg/dL
HDL: 49 mg/dL
LDL Calculated: 60 mg/dL
Non HDL Cholesterol: 74 mg/dL
Triglycerides: 69 mg/dL

## 2012-10-05 LAB — TSH: TSH: 1.01 u[IU]/mL (ref 0.27–4.20)

## 2012-10-05 MED ORDER — HYDROCODONE-ACETAMINOPHEN 5-325 MG PO TABS *I*
2.0000 | ORAL_TABLET | Freq: Four times a day (QID) | ORAL | Status: DC | PRN
Start: 2012-10-05 — End: 2012-12-06

## 2012-10-05 MED ORDER — QUETIAPINE FUMARATE 300 MG PO TABS *I*
600.0000 mg | ORAL_TABLET | Freq: Every evening | ORAL | Status: DC
Start: 2012-10-05 — End: 2012-11-06

## 2012-10-05 NOTE — Telephone Encounter (Signed)
Per the pharm, hydrocodone-acetaminophen needs a pa. Please call mcd at [phone]. Id# [id number].  Rejection message: 85-N2: claim not process. Review agency message MEVS codes for more information or contact provider services at [phone]. See tp information for commonly received MEVS codes and instructions. Additional message: MEVS: not MA eligible, MEVS: prior approval indicated denied/rejected by [place], DVS reason: DVS process not invoked.  303 000 ND 130 001 call [facility] at [phone].  75UD units per day or days supply criteria failure.  Per idx, the patients insurance is MCD id# [id number].    Pt would like this done today

## 2012-10-05 NOTE — Progress Notes (Addendum)
~  [facility]~    SUBJECTIVE     1) Back / Hip Pain  --carrying refrigerator up stairs  --dropped fridge  --pt feel backwards on stairs, hit back  --Seen at [facility] ED  --Given ibuprofen and flexeril  --Pain located in rt lower back  --no radiation  --pain 9/10  --sharp pain, worse with movement, sitting too long  --no relief with flexeril and NSAIDs  --no xrays done at ED      Pain Intensity:   Pain    10/05/12 1357   PainSc:   9   PainLoc: Back         Medications Reviewed    OBJECTIVE     BP 128/76  Pulse 83  Ht 1.676 m (5\' 6" )  Wt 78.019 kg (172 lb)  BMI 27.77 kg/m2  General Appearance: No distress  Back:   No bony tenderness.  No deformities or bruising.  TTP in rt lumbar paraspinal musculature.   Neuro:    A+O x 3. PEARL. 5/5 strength bilaterally.  2+DTRs.  Nl gait.     ASSESSMENT / DIAGNOSIS     1. LBP (low back pain)  HYDROcodone-acetaminophen (NORCO) 5-325 MG per tablet   2. Hypothyroidism  TSH, TSH, CANCELED: TSH   3. HTN (hypertension)  Comprehensive metabolic panel, Lipid panel, Comprehensive metabolic panel, Lipid panel, CANCELED: Lipid panel, CANCELED: Comprehensive metabolic panel   4. Bipolar 1 disorder     5. Bipolar affective disorder  quetiapine (SEROQUEL) 300 MG tablet       ORDERS AND PLAN     LBP (low back pain)  - HYDROcodone-acetaminophen (NORCO) 5-325 MG per tablet; Take 2 tablets by mouth every 6 hours as needed for Pain   MDD 6 tablets    Hypothyroidism  - TSH; Future  - TSH  - Cancel: TSH    HTN (hypertension)  - Comprehensive metabolic panel; Future  - Lipid panel; Future  - Comprehensive metabolic panel  - Lipid panel  - Cancel: Lipid panel  - Cancel: Comprehensive metabolic panel    Bipolar affective disorder  - quetiapine (SEROQUEL) 300 MG tablet; Take 2 tablets (600 mg total) by mouth nightly    --Patient instructed to call if symptoms are not improving or worsening  --Follow-up arranged    Signed: Madolyn Frieze, MD on 10/05/2012 at 2:39 PM  [facility]        I  reviewed iSTOP -- RJF  Others' Prescriptions  Patient Name: Timothy French Birth Date: Jan 04, 1966   Address: [address] [place], [place] [address] Sex: Male     Rx Written Rx Dispensed Drug Strength Quantity Days Supply Prescriber Name   09/10/2012 09/10/2012 Hydrocodone 5/325mg  15 2 Elane Fritz

## 2012-10-05 NOTE — Telephone Encounter (Signed)
Pt calling stating he is not going to come all the way down here and he would like Dr Jonny Ruiz to call him.  Phone # [phone]

## 2012-10-05 NOTE — Telephone Encounter (Signed)
Patient called to ask that we let the pharmacy know why he needs the script. Tried to explain to him that the insurance need a prior authorization he asked me how long it will take. Told him it could take a few days he got upset and said "fuck this this is fucking bullshit, I need my medicine I can't deal with this I'll go get the fucking script and take it to [facility] fuck this, and walked away from the phone so I hung up the phone.Marland KitchenMarland KitchenMarland Kitchen

## 2012-10-05 NOTE — Telephone Encounter (Signed)
Called patient.  He was able to fill all of his scripts at [facility].  I spoke to him about profanity.  He states that he was not directly swearing at anybody and that he was very upset at the pharmacy.   He reports that he was swearing while talking with his wife at the pharmacy and that his phone was on.    He [employer].  I told him that we would not tolerate any swearing with staff in the future.

## 2012-10-08 ENCOUNTER — Encounter: Payer: Self-pay | Admitting: Primary Care

## 2012-11-06 ENCOUNTER — Ambulatory Visit: Payer: Self-pay | Admitting: Primary Care

## 2012-11-06 ENCOUNTER — Encounter: Payer: Self-pay | Admitting: Primary Care

## 2012-11-06 VITALS — BP 133/76 | HR 88 | Ht 66.0 in | Wt 173.0 lb

## 2012-11-06 DIAGNOSIS — M545 Low back pain, unspecified: Secondary | ICD-10-CM

## 2012-11-06 DIAGNOSIS — J45909 Unspecified asthma, uncomplicated: Secondary | ICD-10-CM

## 2012-11-06 DIAGNOSIS — F319 Bipolar disorder, unspecified: Secondary | ICD-10-CM

## 2012-11-06 MED ORDER — QUETIAPINE FUMARATE 300 MG PO TABS *I*
600.0000 mg | ORAL_TABLET | Freq: Every evening | ORAL | Status: DC
Start: 2012-11-06 — End: 2013-03-07

## 2012-11-06 MED ORDER — RISPERIDONE 2 MG PO TABS *I*
2.0000 mg | ORAL_TABLET | Freq: Every day | ORAL | Status: DC
Start: 2012-11-06 — End: 2013-03-07

## 2012-11-06 MED ORDER — MELOXICAM 15 MG PO TABS *I*
15.0000 mg | ORAL_TABLET | Freq: Every day | ORAL | Status: DC
Start: 2012-11-06 — End: 2013-03-07

## 2012-11-06 MED ORDER — ALBUTEROL SULFATE HFA 108 (90 BASE) MCG/ACT IN AERS *I*
INHALATION_SPRAY | RESPIRATORY_TRACT | Status: DC
Start: 2012-11-06 — End: 2014-05-14

## 2012-11-06 NOTE — Progress Notes (Signed)
~  [facility]~    SUBJECTIVE     1)  HTN  --no CP    2) Asthma  --still smoking 1.5 ppd  --needs refills on albuterol    3)LBP  --continues to have rt sided LBP  --radiates down rt leg    4) Psych  --has appt on 1/15   --needs refills on seroquel and ripseridone      Pain Intensity:   Pain    11/06/12 1110   PainSc:   7   PainLoc: Back         Medications Reviewed    OBJECTIVE     BP 133/76  Pulse 88  Ht 1.676 m (5\' 6" )  Wt 78.472 kg (173 lb)  BMI 27.94 kg/m2  General Appearance: No distress  Neuro/Back:    Tenderness elicited over SI joints.  Mild paraspinal muscular tenderness.  5/5 strength bilaterally.  2+DTRs.  Nl gait.     ASSESSMENT / DIAGNOSIS     1. Bipolar affective disorder  risperiDONE (RISPERDAL) 2 MG tablet, quetiapine (SEROQUEL) 300 MG tablet   2. Asthma  albuterol (VENTOLIN HFA) 108 (90 BASE) MCG/ACT inhaler   3. LBP (low back pain)  meloxicam (MOBIC) 15 MG tablet, SACROILIAC JOINTS MIN 3 VIEWS       ORDERS AND PLAN     Bipolar affective disorder  - risperiDONE (RISPERDAL) 2 MG tablet; Take 1 tablet (2 mg total) by mouth daily  - quetiapine (SEROQUEL) 300 MG tablet; Take 2 tablets (600 mg total) by mouth nightly    Asthma  - albuterol (VENTOLIN HFA) 108 (90 BASE) MCG/ACT inhaler; INHALE 2 PUFFS EVERY 4 HOURS AS NEEDED FOR COUGH AND WHEEZE.    LBP (low back pain)  - meloxicam (MOBIC) 15 MG tablet; Take 1 tablet (15 mg total) by mouth daily  - SACROILIAC JOINTS MIN 3 VIEWS; Future    Other Orders  - AMB REFERRAL TO ORTHOPEDIC SURGERY      y        --Patient instructed to call if symptoms are not improving or worsening  --Follow-up arranged    Signed: Madolyn Frieze, MD on 11/06/2012 at 5:29 PM  [facility]

## 2012-11-07 ENCOUNTER — Ambulatory Visit
Admit: 2012-11-07 | Discharge: 2012-11-07 | Disposition: A | Discharge status: Home / Self Care | Payer: Self-pay | Source: Ambulatory Visit | Attending: Primary Care | Admitting: Primary Care

## 2012-11-08 ENCOUNTER — Encounter: Payer: Self-pay | Admitting: Primary Care

## 2012-11-16 ENCOUNTER — Telehealth: Payer: Self-pay | Admitting: Orthopedic Surgery

## 2012-11-16 NOTE — Telephone Encounter (Signed)
Attempted to contact pt to book appt,. Phone disconnected

## 2012-12-06 ENCOUNTER — Encounter: Payer: Self-pay | Admitting: Primary Care

## 2012-12-06 ENCOUNTER — Ambulatory Visit: Payer: Self-pay | Admitting: Primary Care

## 2012-12-06 VITALS — BP 134/78 | HR 87 | Ht 66.14 in | Wt 175.0 lb

## 2012-12-06 DIAGNOSIS — K219 Gastro-esophageal reflux disease without esophagitis: Secondary | ICD-10-CM

## 2012-12-06 DIAGNOSIS — E039 Hypothyroidism, unspecified: Secondary | ICD-10-CM

## 2012-12-06 DIAGNOSIS — F319 Bipolar disorder, unspecified: Secondary | ICD-10-CM

## 2012-12-06 DIAGNOSIS — M545 Low back pain, unspecified: Secondary | ICD-10-CM

## 2012-12-06 MED ORDER — OMEPRAZOLE 20 MG PO CPDR *I*
20.0000 mg | DELAYED_RELEASE_CAPSULE | Freq: Every day | ORAL | Status: DC
Start: 2012-12-06 — End: 2014-02-17

## 2012-12-06 MED ORDER — CYCLOBENZAPRINE HCL 10 MG PO TABS *I*
10.0000 mg | ORAL_TABLET | Freq: Three times a day (TID) | ORAL | Status: DC | PRN
Start: 2012-12-06 — End: 2013-09-12

## 2012-12-06 MED ORDER — MIRTAZAPINE 30 MG PO TABS *I*
ORAL_TABLET | ORAL | Status: DC
Start: 2012-12-06 — End: 2013-03-04

## 2012-12-06 MED ORDER — HYDROCODONE-ACETAMINOPHEN 5-325 MG PO TABS *I*
2.0000 | ORAL_TABLET | Freq: Four times a day (QID) | ORAL | Status: DC | PRN
Start: 2012-12-06 — End: 2012-12-20

## 2012-12-06 MED ORDER — LEVOTHYROXINE SODIUM 75 MCG PO TABS *I*
ORAL_TABLET | ORAL | Status: DC
Start: 2012-12-06 — End: 2013-03-07

## 2012-12-10 NOTE — Progress Notes (Signed)
~  [facility]~    SUBJECTIVE     1) HTN   --no CP, no SOB  --adherent to medications     3)LBP   --continues to have rt sided LBP   --Present for several months  --worse recently   --radiates down rt leg   --aching in character  --no change in strength in legs  --partially relieved with pain medications, oxycodone    4) Psych   --needs refills on seroquel and risperidone  --seen by therapist      Pain Intensity:   Pain    12/06/12 1438   PainSc:   7   PainLoc: Back         Medications Reviewed    OBJECTIVE     BP 134/78  Pulse 87  Ht 1.68 m (5' 6.14")  Wt 79.379 kg (175 lb)  BMI 28.12 kg/m2  General Appearance: No distress  COR: RRR, No Murmurs   Resp:  Respirations unlabored. Clear to auscultation bilaterally       ASSESSMENT / DIAGNOSIS     1. Bipolar affective disorder  mirtazapine (REMERON) 30 MG tablet, levothyroxine (SYNTHROID, LEVOTHROID) 75 MCG tablet   2. Hypothyroidism  levothyroxine (SYNTHROID, LEVOTHROID) 75 MCG tablet   3. GERD (gastroesophageal reflux disease)  omeprazole (PRILOSEC) 20 MG capsule   4. LBP (low back pain)  HYDROcodone-acetaminophen (NORCO) 5-325 MG per tablet, cyclobenzaprine (FLEXERIL) 10 MG tablet, AMB REFERRAL TO PHYS / OCC THERAPY       ORDERS AND PLAN     Bipolar affective disorder  - mirtazapine (REMERON) 30 MG tablet; TAKE 1 TABLET AT BEDTIME  - levothyroxine (SYNTHROID, LEVOTHROID) 75 MCG tablet; TAKE 1 TABLET DAILY.    Hypothyroidism  - levothyroxine (SYNTHROID, LEVOTHROID) 75 MCG tablet; TAKE 1 TABLET DAILY.    GERD (gastroesophageal reflux disease)  - omeprazole (PRILOSEC) 20 MG capsule; Take 1 capsule (20 mg total) by mouth daily (before breakfast)    LBP (low back pain)  - HYDROcodone-acetaminophen (NORCO) 5-325 MG per tablet; Take 2 tablets by mouth every 6 hours as needed for Pain   MDD 3 tablets  - cyclobenzaprine (FLEXERIL) 10 MG tablet; Take 1 tablet (10 mg total) by mouth 3 times daily as needed for Muscle spasms  - AMB REFERRAL TO PHYS / OCC  THERAPY        --Patient instructed to call if symptoms are not improving or worsening  --Follow-up arranged    Signed: Madolyn Frieze, MD on 12/10/2012 at 9:24 AM  [facility]

## 2012-12-18 ENCOUNTER — Emergency Department: Admit: 2012-12-18 | Discharge status: Against Medical Advice | Payer: Self-pay | Source: Ambulatory Visit

## 2012-12-18 NOTE — ED Notes (Signed)
Not in waiting room

## 2012-12-18 NOTE — ED Notes (Signed)
Called multiple times, not in waiting room.

## 2012-12-18 NOTE — ED Notes (Signed)
drsg placed at triage.

## 2012-12-20 ENCOUNTER — Ambulatory Visit: Payer: Self-pay | Admitting: Primary Care

## 2012-12-20 ENCOUNTER — Encounter: Payer: Self-pay | Admitting: Primary Care

## 2012-12-20 VITALS — BP 140/80 | HR 72 | Ht 66.0 in | Wt 175.0 lb

## 2012-12-20 DIAGNOSIS — M545 Low back pain, unspecified: Secondary | ICD-10-CM

## 2012-12-20 DIAGNOSIS — IMO0002 Reserved for concepts with insufficient information to code with codable children: Secondary | ICD-10-CM

## 2012-12-20 MED ORDER — HYDROCODONE-ACETAMINOPHEN 5-325 MG PO TABS *I*
2.0000 | ORAL_TABLET | ORAL | Status: DC | PRN
Start: 2012-12-20 — End: 2013-03-07

## 2012-12-20 NOTE — Progress Notes (Signed)
~  [facility]~    SUBJECTIVE     1)  Hand Laceration  --Slipped on ice, lacerated left hand  --12/18/12  --Had sig bleeding -present to [facility] ED and then went to [facility] for persistent bleeding  --Seen at [facility]  --Xray done-- no obvious foreign body in the RF. Possible tiny chip fracture of head of proximal phalanx at PIP   --Hand surg consulted and suture placed  --Started on antibiotics x 7 days (Augmentin, Clindamycin, etc).  --Continues to have pain, but improving - 8/10, located on left 4th finger, no radiation, worse with bending  --no fevers  --no drainage  --ROM slightly limited by swelling and pain    Pain Intensity:   Pain    12/20/12 1358   PainSc:   8   PainLoc: Hand       Medications Reviewed    OBJECTIVE     BP 140/80  Pulse 72  Ht 1.676 m (5\' 6" )  Wt 79.379 kg (175 lb)  BMI 28.26 kg/m2  General Appearance: No distress  EXT:  1 cm laceration on lateral 4 left digit.  No drainage. ROM intact, but limited due to pain and swelling.  No extending erythema.  No tenderness over proximal tendon shealth     ASSESSMENT / PLAN     Laceration -- Healing well so far.   No evidence of infection.     --Cont Augmentin and clindamycin  --HYDROcodone-acetaminophen (NORCO) 5-325 MG per tablet; Take 2 tablets by mouth every 4-6 hours as needed for Pain   MDD 8 tablets  --Rec f/u with hand surgery    --Patient instructed to call if symptoms are not improving or worsening  --Follow-up arranged    Signed: Madolyn Frieze, MD on 12/20/2012 at 2:33 PM  [facility]

## 2012-12-26 ENCOUNTER — Ambulatory Visit: Payer: Self-pay | Admitting: Critical Care Medicine

## 2012-12-26 ENCOUNTER — Telehealth: Payer: Self-pay | Admitting: Primary Care

## 2012-12-26 ENCOUNTER — Ambulatory Visit: Payer: Self-pay

## 2012-12-26 VITALS — BP 143/93 | HR 87 | Ht 66.0 in | Wt 172.0 lb

## 2012-12-26 DIAGNOSIS — Z4802 Encounter for removal of sutures: Secondary | ICD-10-CM

## 2012-12-26 NOTE — Telephone Encounter (Signed)
Patient cut hand on 28th was supposed to get the stitches out 7-8 days later. [name]'s next acute is Tuesday - can he wait that long? He said his hand is still swollen so he's not sure if we can get them out either.Timothy KitchenMarland KitchenMarland French

## 2012-12-26 NOTE — Progress Notes (Addendum)
[facility]  [facility]    OFFICE VISIT NOTE  SUBJECTIVE     CC: swelling around suture on finger    - injury on 1/28, fell on stairs at home with subsequent finger lac from metal object  - was seen by hand surgeon at [facility] ED with suture placed to stop bleeding  - may have had arterial bleeding when this occurred, but bleeding stopped in ED after suturing  - saw hand surgeon on 1/31, he reports he told to f/u with primary for stitch removal this week and that he did not need to seen hand surgeon again  - has some swelling and redness around stitches now but no change in pain, no change in function of finger  - is on augmentin and clinda for 10 day course, reports taking as directed  - taking oxycodone about twice a day  - no additional injury to finger  - UTD on tetanus vaccine, last 2012    Medications reviewed at time of the visit    FAMILY HISTORY     Family History   Problem Relation Age of Onset   . Other Paternal Grandfather      mental illness   . Hypertension Other    . Mental illness Other    . Other Other      thyroid       Family history was reviewed at today's visit and marked as reviewed: no    OBJECTIVE     BP 143/93  Pulse 87  Ht 1.676 m (5\' 6" )  Wt 78.019 kg (172 lb)  BMI 27.77 kg/m2  General Appearance: No distress, ambulatory and alert  Resp:  Respirations unlabored  Hand: L ring finger with single visible prolene stitch proximal to PIP, well healing laceration, trace erythema at site of stitch, no fluctuance, mild tenderness over site, no bony tenderness, trace swelling of digit, cap refill normal at fingertip, pulses 2+ radial and ulnar, flexion of finger nearly full, sensation to touch decreased over distal ring finger    ASSESSMENT AND PLAN     1) Suture removal:  - 1 stitch removed  - site appears to be healing well overall, does not appear infected today  - may have some nerve injury in finger but function appears intact, anticipate sensation may  improve  - continue abx as directed  -  f/u as needed      Signed: Esperanza Heir, MD on 12/26/2012 at 2:32 PM    [facility]      Attending Attestation      At the time of the visit, I discussed the patient with the provider above. The patient is here for the following problems.    1. Visit for suture removal        I agree with the resident's findings and plan of care as documented above.  Questions answered.  Follow-up arranged.    Signed: Madolyn Frieze, MD on 01/06/2013 at 6:06 PM    Madolyn Frieze, MD

## 2012-12-26 NOTE — Telephone Encounter (Signed)
PT WAS SEEN IN [facility] ED AND SENT HOME   WENT TO [facility] D/T INCREASED BLEEDING   they sutured him up and he saw hand md last fri   he said sutures not ready to come out    told him they did not need to see him again   told him to have his pcp remove stitches    Area red and swollen  Hard to see the stitch d/t the swelling  no drainage  On antibiotics  Gave appt here 5pm (fortuna on) on illness   also advised he call hand md to make sure they don't want to see him instead  Pt agreed to this plan

## 2012-12-26 NOTE — Patient Instructions (Addendum)
Apply vaseline or antibiotic cream a couple times a day while healing.      Return to clinic if it becomes more red, more swollen, had any bleeding or discharge or has worse pain in the hand.

## 2013-01-02 ENCOUNTER — Other Ambulatory Visit: Payer: Self-pay | Admitting: Primary Care

## 2013-01-02 ENCOUNTER — Telehealth: Payer: Self-pay | Admitting: Primary Care

## 2013-01-02 NOTE — Telephone Encounter (Signed)
Pharmacy requesting a PA for   Risperidone 2 mg tab   30 qty / 30 day   Duplicate therapy     [phone]  [facility]   ID [id number]  GRP [id number]

## 2013-01-02 NOTE — Telephone Encounter (Signed)
Patient had [employer] for part D coverage,   Is Risperidone new ? Do you want to obtain PA?

## 2013-01-03 NOTE — Telephone Encounter (Signed)
He should call his psychiatrist who is managing.

## 2013-01-14 ENCOUNTER — Telehealth: Payer: Self-pay | Admitting: Primary Care

## 2013-01-14 NOTE — Telephone Encounter (Signed)
PER THE PHARM, RISPERIDONE 2 MG NEEDS A PA. PLEASE CALL [facility] AT [phone]. ID# [id number]. REJECTION REASON: DUPLICATE THERAPY PA REQUIRED. PER [employer]X THE PATIENTS INSURANCE IS [facility] ID# [id number]. PHONE NUMBER: [phone]

## 2013-01-15 ENCOUNTER — Ambulatory Visit: Payer: Self-pay | Admitting: Orthopedic Surgery

## 2013-01-15 NOTE — Telephone Encounter (Signed)
Duplicate request this was already address that psychiatrist is managing this medication.  Sent response back to pharmacy.

## 2013-03-04 ENCOUNTER — Other Ambulatory Visit: Payer: Self-pay | Admitting: Primary Care

## 2013-03-07 ENCOUNTER — Telehealth: Payer: Self-pay | Admitting: Primary Care

## 2013-03-07 ENCOUNTER — Encounter: Payer: Self-pay | Admitting: Primary Care

## 2013-03-07 ENCOUNTER — Ambulatory Visit: Payer: Self-pay | Admitting: Primary Care

## 2013-03-07 VITALS — BP 136/69 | HR 80 | Ht 66.0 in | Wt 174.0 lb

## 2013-03-07 DIAGNOSIS — F319 Bipolar disorder, unspecified: Secondary | ICD-10-CM

## 2013-03-07 DIAGNOSIS — M545 Low back pain, unspecified: Secondary | ICD-10-CM

## 2013-03-07 DIAGNOSIS — E039 Hypothyroidism, unspecified: Secondary | ICD-10-CM

## 2013-03-07 MED ORDER — BACLOFEN 10 MG PO TABS *I*
10.0000 mg | ORAL_TABLET | Freq: Three times a day (TID) | ORAL | Status: DC
Start: 2013-03-07 — End: 2014-05-14

## 2013-03-07 MED ORDER — LEVOTHYROXINE SODIUM 75 MCG PO TABS *I*
ORAL_TABLET | ORAL | Status: DC
Start: 2013-03-07 — End: 2013-09-12

## 2013-03-07 MED ORDER — QUETIAPINE FUMARATE 300 MG PO TABS *I*
600.0000 mg | ORAL_TABLET | Freq: Every evening | ORAL | Status: DC
Start: 2013-03-07 — End: 2013-09-12

## 2013-03-07 MED ORDER — MIRTAZAPINE 30 MG PO TABS *I*
30.0000 mg | ORAL_TABLET | Freq: Every evening | ORAL | Status: DC
Start: 2013-03-07 — End: 2013-11-27

## 2013-03-07 MED ORDER — RISPERIDONE 2 MG PO TABS *I*
2.0000 mg | ORAL_TABLET | Freq: Every day | ORAL | Status: DC
Start: 2013-03-07 — End: 2013-09-12

## 2013-03-07 MED ORDER — HYDROCODONE-ACETAMINOPHEN 5-325 MG PO TABS *I*
2.0000 | ORAL_TABLET | ORAL | Status: DC | PRN
Start: 2013-03-07 — End: 2014-05-14

## 2013-03-07 MED ORDER — MELOXICAM 7.5 MG PO TABS *I*
7.5000 mg | ORAL_TABLET | Freq: Every day | ORAL | Status: DC
Start: 2013-03-07 — End: 2014-05-14

## 2013-03-07 MED ORDER — DIVALPROEX SODIUM 500 MG PO TBEC *I*
500.0000 mg | DELAYED_RELEASE_TABLET | Freq: Two times a day (BID) | ORAL | Status: DC
Start: 2013-03-07 — End: 2013-09-12

## 2013-03-07 NOTE — Telephone Encounter (Signed)
Can we please request a PA.  Thank you

## 2013-03-07 NOTE — Telephone Encounter (Signed)
Patient needs a prior authorization for the quetiapine fumarate 300mg  tablets. Qty/Supply - 60/30. Take 2 tablets by mouth every morning.     [employer] - [id number] - [phone]    Duplicate Therapy PA

## 2013-03-07 NOTE — Progress Notes (Signed)
~[facility]~    SUBJECTIVE       1)LBP   --Located in rt sided LBP   --Present for several months  --No change in past several months  --Still radiates down rt leg   --Aching in character, occ sharp  --No weakness  --some numbness  --sometimes changes positions helps  --tries warm soaks  --worse in cold  --out of flexeril  --not in physical therapy yet  --pain 7/10 in intesnity    2) HTN / Astjma  --no CP  --using albuterol occ for SOB    3) Hypothyroidism  --on levothyroxine     4) Psych  --needs refills  -has psych appt/intake at end of month    Current Outpatient Prescriptions on File Prior to Visit   Medication Sig Dispense Refill   . omeprazole (PRILOSEC) 20 MG capsule Take 1 capsule (20 mg total) by mouth daily (before breakfast)  30 capsule  5   . albuterol (VENTOLIN HFA) 108 (90 BASE) MCG/ACT inhaler INHALE 2 PUFFS EVERY 4 HOURS AS NEEDED FOR COUGH AND WHEEZE.  1 Inhaler  5   . cyclobenzaprine (FLEXERIL) 10 MG tablet Take 1 tablet (10 mg total) by mouth 3 times daily as needed for Muscle spasms  60 tablet  3     No current facility-administered medications on file prior to visit.         Pain Intensity:   Pain    03/07/13 1324   PainSc:   7         Medications Reviewed    OBJECTIVE     BP 136/69  Pulse 80  Ht 1.676 m (5\' 6" )  Wt 78.926 kg (174 lb)  BMI 28.1 kg/m2  General Appearance: No distress  COR: RRR, No Murmurs   Resp:  Respirations unlabored. Clear to auscultation bilaterally  Back - Mild tenderness in lumbar paraspinal musculature bilaterally.  Mild SI joint tenderness.  5/5 strength.  Gait nl.        ASSESSMENT / DIAGNOSIS     1. Bipolar affective disorder  mirtazapine (REMERON) 30 MG tablet    mirtazapine (REMERON) 30 MG tablet    divalproex (DEPAKOTE) 500 MG delayed release tablet    risperiDONE (RISPERDAL) 2 MG tablet    levothyroxine (SYNTHROID, LEVOTHROID) 75 MCG tablet    quetiapine (SEROQUEL) 300 MG tablet   2. Hypothyroidism  TSH    TSH    levothyroxine (SYNTHROID, LEVOTHROID)  75 MCG tablet   3. LBP (low back pain)  HYDROcodone-acetaminophen (NORCO) 5-325 MG per tablet    HYDROcodone-acetaminophen (NORCO) 5-325 MG per tablet    baclofen (LIORESAL) 10 MG tablet    meloxicam (MOBIC) 7.5 MG tablet    AMB REFERRAL TO PHYS / OCC THERAPY    AMB REFERRAL TO ORTHOPEDIC SURGERY       ORDERS AND PLAN     Bipolar affective disorder  - mirtazapine (REMERON) 30 MG tablet; Take 1 tablet (30 mg total) by mouth nightly   at bedtime.  - divalproex (DEPAKOTE) 500 MG delayed release tablet; Take 1 tablet (500 mg total) by mouth 2 times daily  - risperiDONE (RISPERDAL) 2 MG tablet; Take 1 tablet (2 mg total) by mouth daily  - levothyroxine (SYNTHROID, LEVOTHROID) 75 MCG tablet; TAKE 1 TABLET DAILY.  - quetiapine (SEROQUEL) 300 MG tablet; Take 2 tablets (600 mg total) by mouth nightly    Hypothyroidism  - TSH; Future  - levothyroxine (SYNTHROID, LEVOTHROID) 75 MCG tablet; TAKE 1 TABLET  DAILY.    LBP (low back pain) -- Rec NSAIDs, PT, ROM exercises.  Very limited short term norco.   - HYDROcodone-acetaminophen (NORCO) 5-325 MG per tablet; Take 2 tablets by mouth every 4-6 hours as needed for Pain   MDD 8 tablets  - baclofen (LIORESAL) 10 MG tablet; Take 1 tablet (10 mg total) by mouth 3 times daily  - meloxicam (MOBIC) 7.5 MG tablet; Take 1 tablet (7.5 mg total) by mouth daily  - AMB REFERRAL TO PHYS / OCC THERAPY  - AMB REFERRAL TO ORTHOPEDIC SURGERY        --Patient instructed to call if symptoms are not improving or worsening  --Follow-up arranged    Signed: Madolyn Frieze, MD on 03/07/2013 at 2:07 PM  [facility]

## 2013-03-08 NOTE — Telephone Encounter (Signed)
PA form requested through [place] health care medicaid.   Form started and put in your box for completion of medical questions.

## 2013-03-27 NOTE — Telephone Encounter (Signed)
Finally got response from [place] health care reg: prior auth for Quetiapine fumarate.  Letter put in your box with explanation of denial.

## 2013-04-11 ENCOUNTER — Ambulatory Visit: Payer: Self-pay | Admitting: Physical Medicine and Rehabilitation

## 2013-05-17 ENCOUNTER — Ambulatory Visit: Payer: Self-pay | Admitting: Physical Medicine and Rehabilitation

## 2013-07-01 ENCOUNTER — Telehealth: Payer: Self-pay | Admitting: Primary Care

## 2013-07-01 NOTE — Telephone Encounter (Signed)
Per pharmacy pt needs PA for Risperidone, pt has [employer], [place] [phone], [place] [id number]

## 2013-07-02 NOTE — Telephone Encounter (Signed)
Called patients ins. This medication is being rejected b/c he just got Seroquel 300mg  filled 06/28/13.  Is patient suppose to be on both Seroquel and risperidone. (risperidone was last filled 04/22/13 pt contacting pharmacy for refill.

## 2013-07-03 NOTE — Telephone Encounter (Signed)
Yes.  He was on both.   He is supposed to be seeing psych.  We prescribed a bridge script until seen by psych.

## 2013-07-12 NOTE — Telephone Encounter (Signed)
According to ins co this medication is covered but not if prescribed with Seroquel.  Faxed over prior auth form explaining pt's need to be on both as well as other meds he is on for bipolar disorder.

## 2013-07-15 NOTE — Telephone Encounter (Signed)
PA DENIED for risperidone because: coverage for the requested medication is not provided for the condition for which the medication was prescribed. Placed denial in your box with appeal information.

## 2013-07-16 NOTE — Telephone Encounter (Signed)
Denial with appeal directions have been placed in your box.

## 2013-07-26 ENCOUNTER — Ambulatory Visit: Payer: Self-pay | Admitting: Primary Care

## 2013-08-02 ENCOUNTER — Ambulatory Visit: Payer: Self-pay | Admitting: Primary Care

## 2013-09-11 ENCOUNTER — Telehealth: Payer: Self-pay | Admitting: Primary Care

## 2013-09-11 DIAGNOSIS — F319 Bipolar disorder, unspecified: Secondary | ICD-10-CM

## 2013-09-11 DIAGNOSIS — E039 Hypothyroidism, unspecified: Secondary | ICD-10-CM

## 2013-09-11 DIAGNOSIS — M545 Low back pain, unspecified: Secondary | ICD-10-CM

## 2013-09-11 NOTE — Telephone Encounter (Signed)
PT CALLING REQUESTING REFILLS FOR levothyroxine (SYNTHROID, LEVOTHROID) 75 MCG tablet, divalproex (DEPAKOTE) 500 MG delayed release tablet,quetiapine (SEROQUEL) 300 MG tablet, risperiDONE (RISPERDAL) 2 MG tablet mirtazapine (REMERON) 30 MG tablet AND cyclobenzaprine (FLEXERIL) 10 MG tablet.

## 2013-09-12 ENCOUNTER — Telehealth: Payer: Self-pay | Admitting: Primary Care

## 2013-09-12 MED ORDER — CYCLOBENZAPRINE HCL 10 MG PO TABS *I*
10.0000 mg | ORAL_TABLET | Freq: Three times a day (TID) | ORAL | Status: DC | PRN
Start: 2013-09-12 — End: 2014-03-18

## 2013-09-12 MED ORDER — DIVALPROEX SODIUM 500 MG PO TBEC *I*
500.0000 mg | DELAYED_RELEASE_TABLET | Freq: Two times a day (BID) | ORAL | Status: DC
Start: 2013-09-12 — End: 2014-02-17

## 2013-09-12 MED ORDER — QUETIAPINE FUMARATE 300 MG PO TABS *I*
600.0000 mg | ORAL_TABLET | Freq: Every evening | ORAL | Status: DC
Start: 2013-09-12 — End: 2014-02-17

## 2013-09-12 MED ORDER — LEVOTHYROXINE SODIUM 75 MCG PO TABS *I*
ORAL_TABLET | ORAL | Status: DC
Start: 2013-09-12 — End: 2014-02-17

## 2013-09-12 MED ORDER — RISPERIDONE 2 MG PO TABS *I*
2.0000 mg | ORAL_TABLET | Freq: Every day | ORAL | Status: DC
Start: 2013-09-12 — End: 2014-02-17

## 2013-09-12 NOTE — Telephone Encounter (Signed)
Rx called in to pharmacy.  RJF

## 2013-09-12 NOTE — Telephone Encounter (Signed)
PA needed for Risperidone 2 mg tab.  [facility] ID [id number]  Pa Required call [phone]

## 2013-09-12 NOTE — Telephone Encounter (Signed)
PT AWARE, THANK YOU.

## 2013-09-13 NOTE — Telephone Encounter (Signed)
This PA was denied back and August. You wrote in message that you had only prescribed a bridge script and that patient was suppose to see psych.   Are you continuing to prescribe? Pt was denied in past b/c he is also on Seroquel.

## 2013-09-14 NOTE — Telephone Encounter (Signed)
We do not need to apply for a PA

## 2013-11-27 ENCOUNTER — Other Ambulatory Visit: Payer: Self-pay | Admitting: Primary Care

## 2013-12-21 ENCOUNTER — Other Ambulatory Visit: Payer: Self-pay | Admitting: Primary Care

## 2013-12-21 ENCOUNTER — Telehealth: Payer: Self-pay | Admitting: Primary Care

## 2013-12-21 NOTE — Telephone Encounter (Signed)
Per pharmacy pt needs PA for Quetiapine Fumarate 300mg  tab, [facility] [employer], ID # [id number]

## 2013-12-24 NOTE — Telephone Encounter (Signed)
Per Dr.[name] this medication was prescribed as a bridge script he needs to find psychiatrist. Pharmacy notified.  See PA Request from Oct 2014.

## 2014-01-01 ENCOUNTER — Telehealth: Payer: Self-pay | Admitting: Primary Care

## 2014-01-01 NOTE — Telephone Encounter (Signed)
Per pharmacy pa is required for quetiapine fumarate 300 mg tab. No reason stated. Id [id number]

## 2014-01-01 NOTE — Telephone Encounter (Signed)
Duplicate request

## 2014-02-17 ENCOUNTER — Telehealth: Payer: Self-pay | Admitting: Primary Care

## 2014-02-17 DIAGNOSIS — F319 Bipolar disorder, unspecified: Secondary | ICD-10-CM

## 2014-02-17 DIAGNOSIS — K219 Gastro-esophageal reflux disease without esophagitis: Secondary | ICD-10-CM

## 2014-02-17 DIAGNOSIS — E039 Hypothyroidism, unspecified: Secondary | ICD-10-CM

## 2014-02-17 MED ORDER — DIVALPROEX SODIUM 500 MG PO TBEC *I*
500.0000 mg | DELAYED_RELEASE_TABLET | Freq: Two times a day (BID) | ORAL | Status: DC
Start: 2014-02-17 — End: 2014-05-14

## 2014-02-17 MED ORDER — LEVOTHYROXINE SODIUM 75 MCG PO TABS *I*
ORAL_TABLET | ORAL | Status: DC
Start: 2014-02-17 — End: 2014-05-14

## 2014-02-17 MED ORDER — QUETIAPINE FUMARATE 300 MG PO TABS *I*
600.0000 mg | ORAL_TABLET | Freq: Every evening | ORAL | Status: DC
Start: 2014-02-17 — End: 2014-05-14

## 2014-02-17 MED ORDER — RISPERIDONE 2 MG PO TABS *I*
2.0000 mg | ORAL_TABLET | Freq: Every day | ORAL | Status: DC
Start: 2014-02-17 — End: 2014-05-14

## 2014-02-17 MED ORDER — MIRTAZAPINE 30 MG PO TABS *I*
30.0000 mg | ORAL_TABLET | Freq: Every evening | ORAL | Status: DC
Start: 2014-02-17 — End: 2014-05-14

## 2014-02-17 MED ORDER — OMEPRAZOLE 20 MG PO CPDR *I*
20.0000 mg | DELAYED_RELEASE_CAPSULE | Freq: Every day | ORAL | Status: DC
Start: 2014-02-17 — End: 2014-12-25

## 2014-02-17 NOTE — Telephone Encounter (Signed)
Pt calling requesting refills for divalproex (DEPAKOTE) 500 MG delayed release tablet, levothyroxine (SYNTHROID, LEVOTHROID) 75 MCG tablet, risperiDONE (RISPERDAL) 2 MG tablet, mirtazapine (REMERON) 30 MG tablet and cyclobenzaprine (FLEXERIL) 10 MG tablet please.

## 2014-02-17 NOTE — Telephone Encounter (Signed)
rx called in to pharmacy  RJF

## 2014-02-18 ENCOUNTER — Telehealth: Payer: Self-pay | Admitting: Primary Care

## 2014-02-18 NOTE — Telephone Encounter (Signed)
refaxed PA from 07/2013 and changed dates.

## 2014-02-18 NOTE — Telephone Encounter (Signed)
Per pharmacy pa is required for risperidone 2mg  tab. Id [id number] [phone]

## 2014-03-10 NOTE — Telephone Encounter (Signed)
Gaspar Bidding from [facility] is calling to advise that the medication was approved through 02/20/2015  PA [id number]

## 2014-03-18 ENCOUNTER — Ambulatory Visit: Payer: Self-pay | Admitting: Primary Care

## 2014-03-18 ENCOUNTER — Other Ambulatory Visit: Payer: Self-pay | Admitting: Primary Care

## 2014-03-18 ENCOUNTER — Telehealth: Payer: Self-pay | Admitting: Primary Care

## 2014-03-18 NOTE — Telephone Encounter (Signed)
There should be enough refills on this last script to cover him.  He should come in for a f/u appt after her returns.  Also need to establish care back with psychiatry.  R

## 2014-03-18 NOTE — Telephone Encounter (Signed)
Has cancelled tomorrow's appointment as he is leaving tonight for a one month vacation.  He still wants his meds before he leaves.  Needs refills for depakote, risperadol, remeron, flexerall.   [facility] [address]

## 2014-03-18 NOTE — Telephone Encounter (Signed)
Gave the patient the message. He will call back if no refills at pharmacy

## 2014-03-19 ENCOUNTER — Ambulatory Visit: Payer: Self-pay | Admitting: Primary Care

## 2014-05-01 ENCOUNTER — Emergency Department
Admission: EM | Admit: 2014-05-01 | Disposition: A | Discharge status: Home / Self Care | Payer: Self-pay | Source: Ambulatory Visit

## 2014-05-01 ENCOUNTER — Encounter: Payer: Self-pay | Admitting: Emergency Medicine

## 2014-05-01 MED ORDER — AMOXICILLIN-POT CLAVULANATE 875-125 MG PO TABS *I*
ORAL_TABLET | ORAL | Status: AC
Start: 2014-05-01 — End: 2014-05-01
  Filled 2014-05-01: qty 1

## 2014-05-01 MED ORDER — AMOXICILLIN-POT CLAVULANATE 875-125 MG PO TABS *I*
1.0000 | ORAL_TABLET | Freq: Once | ORAL | Status: DC
Start: 2014-05-01 — End: 2014-05-01

## 2014-05-01 MED ORDER — AMOXICILLIN-POT CLAVULANATE 875-125 MG PO TABS *I*
1.0000 | ORAL_TABLET | Freq: Two times a day (BID) | ORAL | Status: AC
Start: 2014-05-01 — End: 2014-05-06
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
## 2014-05-01 NOTE — ED Notes (Signed)
Pt discharged back to jail, pt remains in [employer] custody, pt handcuffed and unable to sign discharge instructions, pt given 1 antibiotic prior to discharge along with 1 prescription, pt's abrasion cleaned and dressing applied, pt dressed and remains in handcuffs, ambulates to door with officer, officer given 1 prescription and discharge instructions and paperwork

## 2014-05-01 NOTE — Discharge Instructions (Signed)
Follow up with medical provider if you develop redness, increase swelling or drainage    Take antibiotics as directed    Ibuprofen for pain

## 2014-05-14 ENCOUNTER — Encounter: Payer: Self-pay | Admitting: Primary Care

## 2014-05-14 ENCOUNTER — Ambulatory Visit: Payer: Self-pay | Admitting: Primary Care

## 2014-05-14 VITALS — BP 118/76 | HR 76 | Wt 190.0 lb

## 2014-05-14 DIAGNOSIS — F319 Bipolar disorder, unspecified: Secondary | ICD-10-CM

## 2014-05-14 DIAGNOSIS — J45909 Unspecified asthma, uncomplicated: Secondary | ICD-10-CM

## 2014-05-14 DIAGNOSIS — I10 Essential (primary) hypertension: Secondary | ICD-10-CM

## 2014-05-14 DIAGNOSIS — E039 Hypothyroidism, unspecified: Secondary | ICD-10-CM

## 2014-05-14 LAB — COMPREHENSIVE METABOLIC PANEL
ALT: 29 U/L (ref 0–50)
AST: 67 U/L — ABNORMAL HIGH (ref 0–50)
Albumin: 4.6 g/dL (ref 3.5–5.2)
Alk Phos: 76 U/L (ref 40–130)
Anion Gap: 11 (ref 7–16)
Bilirubin,Total: 0.3 mg/dL (ref 0.0–1.2)
CO2: 29 mmol/L — ABNORMAL HIGH (ref 20–28)
Calcium: 9.3 mg/dL (ref 8.6–10.2)
Chloride: 100 mmol/L (ref 96–108)
Creatinine: 1 mg/dL (ref 0.67–1.17)
GFR,Black: 103 *
GFR,Caucasian: 89 *
Glucose: 87 mg/dL (ref 60–99)
Lab: 15 mg/dL (ref 6–20)
Potassium: 4.8 mmol/L (ref 3.3–5.1)
Sodium: 140 mmol/L (ref 133–145)
Total Protein: 7.9 g/dL — ABNORMAL HIGH (ref 6.3–7.7)

## 2014-05-14 LAB — LIPID PANEL
Chol/HDL Ratio: 3.5
Cholesterol: 188 mg/dL
HDL: 54 mg/dL
LDL Calculated: 108 mg/dL
Non HDL Cholesterol: 134 mg/dL
Triglycerides: 131 mg/dL

## 2014-05-14 LAB — CBC AND DIFFERENTIAL
Baso # K/uL: 0 10*3/uL (ref 0.0–0.1)
Basophil %: 0.5 % (ref 0.2–1.2)
Eos # K/uL: 0.1 10*3/uL (ref 0.0–0.5)
Eosinophil %: 1.8 % (ref 0.8–7.0)
Hematocrit: 44 % (ref 40–51)
Hemoglobin: 14.7 g/dL (ref 13.7–17.5)
Lymph # K/uL: 3.2 10*3/uL (ref 1.3–3.6)
Lymphocyte %: 43.8 % (ref 21.8–53.1)
MCH: 32 pg/cell (ref 26–32)
MCHC: 34 g/dL (ref 32–37)
MCV: 95 fL — ABNORMAL HIGH (ref 79–92)
Mono # K/uL: 0.7 10*3/uL (ref 0.3–0.8)
Monocyte %: 10 % (ref 5.3–12.2)
Neut # K/uL: 3.3 10*3/uL (ref 1.8–5.4)
Platelets: 266 10*3/uL (ref 150–330)
RBC: 4.6 MIL/uL (ref 4.6–6.1)
RDW: 13.6 % (ref 11.6–14.4)
Seg Neut %: 43.9 % (ref 34.0–67.9)
WBC: 7.4 10*3/uL (ref 4.2–9.1)

## 2014-05-14 LAB — TSH: TSH: 1.35 u[IU]/mL (ref 0.27–4.20)

## 2014-05-14 MED ORDER — MIRTAZAPINE 30 MG PO TABS *I*
30.0000 mg | ORAL_TABLET | Freq: Every evening | ORAL | Status: DC
Start: 2014-05-14 — End: 2014-08-19

## 2014-05-14 MED ORDER — LEVOTHYROXINE SODIUM 75 MCG PO TABS *I*
ORAL_TABLET | ORAL | Status: DC
Start: 2014-05-14 — End: 2014-08-19

## 2014-05-14 MED ORDER — QUETIAPINE FUMARATE 300 MG PO TABS *I*
600.0000 mg | ORAL_TABLET | Freq: Every evening | ORAL | Status: DC
Start: 2014-05-14 — End: 2014-08-19

## 2014-05-14 MED ORDER — ALBUTEROL SULFATE HFA 108 (90 BASE) MCG/ACT IN AERS *I*
INHALATION_SPRAY | RESPIRATORY_TRACT | Status: DC
Start: 2014-05-14 — End: 2014-08-19

## 2014-05-14 MED ORDER — DIVALPROEX SODIUM 500 MG PO TBEC *I*
500.0000 mg | DELAYED_RELEASE_TABLET | Freq: Two times a day (BID) | ORAL | Status: DC
Start: 2014-05-14 — End: 2014-08-19

## 2014-05-14 MED ORDER — RISPERIDONE 2 MG PO TABS *I*
2.0000 mg | ORAL_TABLET | Freq: Every day | ORAL | Status: DC
Start: 2014-05-14 — End: 2014-08-19

## 2014-05-14 NOTE — Progress Notes (Signed)
~  [facility]~    SUBJECTIVE     1) Psych  --Taking seroquel, depakote, risperdal, remeron  --on Levothyroxine  --engaging in [facility]  --Therapsits -- Marvene Staffamara Leach -- [facility]    2)  Asthma  --on albuterol    3) Thyroid  --on levothyroxine    4) ETOH  --last drink 2 months ago  --incarcerated for DWI  --going to [facility]    Pain Intensity:   Pain    05/14/14 1500   PainSc:   7   PainLoc: Back         Medications Reviewed    OBJECTIVE     BP 118/76    Pulse 76    Wt 86.183 kg (190 lb)   General Appearance: No distress  Eyes:  PEARL, EOM intact   Conjunctiva not injected  Ears, Nose, Mouth:  Oral mucosa moist, no apparent lesions, Nasal mucosa pink   COR: RRR, No Murmurs   Resp:  Respirations unlabored. Clear to auscultation bilaterally       ASSESSMENT / DIAGNOSIS     1. Hypothyroidism  levothyroxine (SYNTHROID, LEVOTHROID) 75 MCG tablet    Hemoglobin A1c    Lipid add Rfx to Drt LDL if Trig >400    TSH    Hemoglobin A1c    Lipid add Rfx to Drt LDL if Trig >400    TSH   2. Bipolar affective disorder  levothyroxine (SYNTHROID, LEVOTHROID) 75 MCG tablet    divalproex (DEPAKOTE) 500 MG delayed release tablet    mirtazapine (REMERON) 30 MG tablet    quetiapine (SEROQUEL) 300 MG tablet    risperiDONE (RISPERDAL) 2 MG tablet    Comprehensive metabolic panel    CBC and differential    Comprehensive metabolic panel    CBC and differential   3. Asthma  albuterol HFA (VENTOLIN HFA) 108 (90 BASE) MCG/ACT inhaler   4. Essential hypertension     5. Bipolar affective         ORDERS AND PLAN     Hypothyroidism  - levothyroxine (SYNTHROID, LEVOTHROID) 75 MCG tablet; TAKE 1 TABLET DAILY.  - Hemoglobin A1c; Future  - Lipid add Rfx to Drt LDL if Trig >400; Future  - TSH; Future  - Hemoglobin A1c  - Lipid add Rfx to Drt LDL if Trig >400  - TSH    Bipolar affective disorder  - levothyroxine (SYNTHROID, LEVOTHROID) 75 MCG tablet; TAKE 1 TABLET DAILY.  - divalproex (DEPAKOTE) 500 MG  delayed release tablet; Take 1 tablet (500 mg total) by mouth 2 times daily  - mirtazapine (REMERON) 30 MG tablet; Take 1 tablet (30 mg total) by mouth nightly   at bedtime.  - quetiapine (SEROQUEL) 300 MG tablet; Take 2 tablets (600 mg total) by mouth nightly  - risperiDONE (RISPERDAL) 2 MG tablet; Take 1 tablet (2 mg total) by mouth daily  - Comprehensive metabolic panel; Future  - CBC and differential; Future  - Comprehensive metabolic panel  - CBC and differential    Asthma  - albuterol HFA (VENTOLIN HFA) 108 (90 BASE) MCG/ACT inhaler; INHALE 2 PUFFS EVERY 4 HOURS AS NEEDED FOR COUGH AND WHEEZE.    Essential hypertension -- Well controlled.      --Patient instructed to call if symptoms are not improving or worsening  --Follow-up arranged    Signed: Madolyn FriezeOBERT J Khyre Germond, MD on 05/14/2014 at 4:09 PM  [facility]

## 2014-05-15 LAB — HEMOGLOBIN A1C: Hemoglobin A1C: 5.4 % (ref 4.0–6.0)

## 2014-05-16 ENCOUNTER — Encounter: Payer: Self-pay | Admitting: Primary Care

## 2014-05-19 ENCOUNTER — Encounter: Payer: Self-pay | Admitting: Primary Care

## 2014-06-12 ENCOUNTER — Ambulatory Visit: Payer: Self-pay | Admitting: Primary Care

## 2014-06-13 ENCOUNTER — Encounter: Payer: Self-pay | Admitting: Primary Care

## 2014-06-19 ENCOUNTER — Encounter: Payer: Self-pay | Admitting: Primary Care

## 2014-06-19 ENCOUNTER — Ambulatory Visit: Payer: Self-pay | Admitting: Primary Care

## 2014-06-19 VITALS — BP 128/84 | HR 88 | Wt 194.0 lb

## 2014-06-19 DIAGNOSIS — M533 Sacrococcygeal disorders, not elsewhere classified: Secondary | ICD-10-CM

## 2014-06-19 DIAGNOSIS — M545 Low back pain, unspecified: Secondary | ICD-10-CM

## 2014-06-19 MED ORDER — OXYCODONE-ACETAMINOPHEN 5-325 MG PO TABS *I*
1.0000 | ORAL_TABLET | ORAL | Status: DC | PRN
Start: 2014-06-19 — End: 2014-12-19

## 2014-06-19 NOTE — Progress Notes (Signed)
~  [facility]~    SUBJECTIVE     1) Back Injury  --fell off ladder  --hit left flank on paint can  --tried ibuprofen, flexeril  --pain 9/10  --worse with pressure or certain movements  --no blood in urine  --no radiation  --no head injury  --no LOC        Pain Intensity:   Pain    06/19/14 1538   PainSc:   9         Medications Reviewed    OBJECTIVE     BP 128/84 mmHg   Pulse 88   Wt 87.998 kg (194 lb)  General Appearance: No distress  Back:    Mild tenderness over iliac crest and left paraspinal musculature.   5/5 strenght      ASSESSMENT / DIAGNOSIS     1. Sacral pain  SACRUM & COCCYX MIN 2 VIEWS    SPINE LUMBAR 2 OR 3 VIEWS LIMITED    oxyCODONE-acetaminophen (PERCOCET) 5-325 MG per tablet   2. Lumbar back pain  SACRUM & COCCYX MIN 2 VIEWS    SPINE LUMBAR 2 OR 3 VIEWS LIMITED    oxyCODONE-acetaminophen (PERCOCET) 5-325 MG per tablet       ORDERS AND PLAN     Sacral pain  - SACRUM & COCCYX MIN 2 VIEWS; Future  - SPINE LUMBAR 2 OR 3 VIEWS LIMITED; Future  - oxyCODONE-acetaminophen (PERCOCET) 5-325 MG per tablet; Take 1 tablet by mouth every 4-6 hours as needed for Pain   Max daily dose: 4 tablets    Lumbar back pain  - SACRUM & COCCYX MIN 2 VIEWS; Future  - SPINE LUMBAR 2 OR 3 VIEWS LIMITED; Future  - oxyCODONE-acetaminophen (PERCOCET) 5-325 MG per tablet; Take 1 tablet by mouth every 4-6 hours as needed for Pain   Max daily dose: 4 tablets      --Patient instructed to call if symptoms are not improving or worsening  --Follow-up arranged    Signed: Madolyn FriezeOBERT J Emmons Toth, MD on 06/20/2014 at 2:19 PM  [facility]

## 2014-07-14 ENCOUNTER — Telehealth: Payer: Self-pay | Admitting: Primary Care

## 2014-07-14 NOTE — Telephone Encounter (Signed)
Per Dr.[name] this medication was prescribed as a bridge script he needs to find psychiatrist. Pharmacy notified.  See PA Request from Oct 2014.        PA denied in the past. .  Timothy French has this patient not gotten in to see anyone are you continuing to refill for him?  PA In media tab dated 6/6 was last denial.

## 2014-07-14 NOTE — Telephone Encounter (Signed)
[facility] on [address] faxed over a request for PA for quetiapine (SEROQUEL) 300 MG tablet  Rejection message reads: PLAN LIMIT EXCEEDED. REDUCE QUANTITY TO ALLOWED QUANTITY AND DAYS SUPPLY    Primary insurance [employer]  CIN # [id number]    Pharmacist comments: "insurance is requiring prior auth for use of both risperidone and quetiapine. Please contact prior auth dept at [phone]

## 2014-07-15 ENCOUNTER — Ambulatory Visit
Admit: 2014-07-15 | Discharge: 2014-07-15 | Disposition: A | Discharge status: Home / Self Care | Payer: Self-pay | Source: Ambulatory Visit | Attending: Primary Care | Admitting: Primary Care

## 2014-07-18 ENCOUNTER — Encounter: Payer: Self-pay | Admitting: Primary Care

## 2014-07-23 NOTE — Telephone Encounter (Signed)
Rec stopping risperidone and cont seroquel.   Rec f/u appt.

## 2014-07-23 NOTE — Telephone Encounter (Signed)
Please advise 

## 2014-07-24 NOTE — Telephone Encounter (Signed)
Seroquel is the one that needs PA do we want to move forward with the PA?

## 2014-07-24 NOTE — Telephone Encounter (Signed)
Unable to let the patient know phone is disconnected. Faxed denial to pharmacy.

## 2014-07-24 NOTE — Telephone Encounter (Signed)
He can just cont the rispirdone and stop the seroquel.   Please have him followup so that we can determine what is going on.   R

## 2014-08-08 ENCOUNTER — Emergency Department: Admit: 2014-08-08 | Disposition: A | Discharge status: Home / Self Care | Payer: Self-pay | Source: Ambulatory Visit

## 2014-08-08 ENCOUNTER — Encounter: Payer: Self-pay | Admitting: Emergency Medicine

## 2014-08-08 LAB — HM HIV SCREENING OFFERED

## 2014-08-08 MED ORDER — CEPHALEXIN 250 MG PO CAPS *I*
500.0000 mg | ORAL_CAPSULE | Freq: Once | ORAL | Status: AC
Start: 2014-08-08 — End: 2014-08-08
  Administered 2014-08-08: 500 mg via ORAL
  Filled 2014-08-08: qty 2

## 2014-08-08 MED ORDER — CEPHALEXIN 500 MG PO CAPS *I*
500.0000 mg | ORAL_CAPSULE | Freq: Four times a day (QID) | ORAL | Status: AC
Start: 2014-08-08 — End: 2014-08-13

## 2014-08-08 NOTE — ED Provider Notes (Signed)
History     Chief Complaint   Patient presents with    Laceration     Fell into knife yesterday while working on a roof, laceration to left upper arm. Has t-shirt tied around wound, states it has not stopped bleeding. Last tetanus unknown.        HPI Comments: 48 year old male presents with laceration to left upper arm, occurred last night while at work, was on a roof when he slipped was holding utility knife in right hand and accidentally cut left upper arm, occurred around 7:30 pm. Area has been bleeding since, was going to come to ER this morning but had to go to family court prior to coming. No weakness or numbness in arm. Tetanus is up to date. R hand dominant.       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  He has been smoking about 1.00 pack per day. He does not have any smokeless tobacco history on file. He reports that he does not drink alcohol. His drug and sexual activity histories are not on file.    Living Situation     Questions Responses    Patient lives with Alone    Homeless     Caregiver for other family member     External Services     Employment Employed    Domestic Violence Risk           Problem List     Patient Active Problem List   Diagnosis Code    Asthma 493.90    Hypothyroidism 244.9    Bipolar Disorder 296.00    Cervical Vertebral Fusion     Chronic Pain 338.29    Esophageal Reflux 530.81    Nicotine Dependence 305.1    Cervicalgia 723.1     Difficulty Breathing (Dyspnea) 786.05    Joint Pain, Localized In The Hip 719.45    Backache 724.5    Carpal Tunnel Syndrome 354.0    ACP Staging Stage 1 Hypertension: 140-159 / 90-99 401.9    Hypertension 401.9    Asthma 493.90    Bipolar affective 296.80       Review of Systems   Review of Systems   Constitutional: Negative for fever and fatigue.   Musculoskeletal: Positive for myalgias. Negative for joint swelling and arthralgias.   Skin: Positive for wound.   Neurological: Negative for weakness and numbness.       Physical Exam     ED Triage Vitals   BP Heart Rate Heart Rate(via Pulse Ox) Resp Temp Temp Source SpO2 O2 Device O2 Flow Rate  08/08/14 1156 08/08/14 1156 -- 08/08/14 1156 08/08/14 1156 08/08/14 1156 08/08/14 1156 08/08/14 1156 --   150/90 mmHg 104  18 37.2 C (99 F) TEMPORAL 97 % None (Room air)       Weight           08/08/14 1156           90.719 kg (200 lb)               Physical Exam   Constitutional: He is oriented to person, place, and time. He appears well-developed and well-nourished. No distress.   Musculoskeletal:        Left shoulder: Normal.        Left elbow: Normal.        Left wrist: Normal.        Left hand: Normal.   Left arm with FROM with strong radial pulse in wrist and intact sensation throughout arm   Neurological: He is alert and oriented to person, place, and time.   Skin: He is not diaphoretic.        Psychiatric: He has a normal mood and affect.       Medical Decision Making        Initial Evaluation:  ED First Provider Contact     Date/Time Event User Comments    08/08/14 1158 ED Provider First Contact Golden Hurter Initial Face to Face Provider Contact          Patient seen by me as above    Assessment:  48 y.o., male comes to the ED with laceration of left arm    Differential Diagnosis includes Laceration of left arm, due to size of wound and continued bleeding does require closure, due to delay in presentation higher risk of infection will require antibiotic  prophylaxis                  Plan: Wound sutured, Discharge home with wound care instructions, keflex 500 mg QID for 5 days, follow up for suture removal in 10-12 days, return precautions discussed    Golden Hurter, PA    Supervising physician Johny Drilling was immediately available          Golden Hurter, [place]  08/08/14 1220

## 2014-08-08 NOTE — ED Notes (Signed)
Pt a&o, ambulatory at time of d/c. Discharge instructions given to pt who verbalizes understanding.

## 2014-08-08 NOTE — ED Notes (Signed)
Fell into knife yesterday while working on a roof, laceration to left upper arm. Has t-shirt tied around wound, states it has not stopped bleeding. Last tetanus unknown.

## 2014-08-08 NOTE — ED Procedure Documentation (Signed)
Procedures   Laceration repair  Date/Time: 08/08/2014 12:20 PM  Performed by: Golden Hurter  Authorized by: Golden Hurter  Consent: Verbal consent obtained.  Risks and benefits: risks, benefits and alternatives were discussed  Consent given by: patient  Patient understanding: patient states understanding of the procedure being performed  Patient consent: the patient's understanding of the procedure matches consent given  Procedure consent: procedure consent matches procedure scheduled  Patient identity confirmed: verbally with patient and arm band  Body area: upper extremity  Location details: left upper arm  Laceration length: 3 cm  Foreign bodies: no foreign bodies  Tendon involvement: none  Nerve involvement: none  Vascular damage: no  Anesthesia: local infiltration  Local anesthetic: lidocaine 1% without epinephrine  Anesthetic total: 2 ml  Patient sedated: no  Preparation: Patient was prepped and draped in the usual sterile fashion.  Irrigation solution: saline  Irrigation method: syringe  Amount of cleaning: standard  Debridement: none  Degree of undermining: none  Skin closure: 5-0 Prolene  Number of sutures: 4  Technique: simple  Dressing: 4x4 sterile gauze and antibiotic ointment  Patient tolerance: Patient tolerated the procedure well with no immediate complications        Golden Hurter, PA

## 2014-08-08 NOTE — ED Notes (Signed)
Pt was seen, evaluated and treated by ed provider.

## 2014-08-08 NOTE — Discharge Instructions (Signed)
Keep area bandaged, change twice daily, apply topical antibiotic ointment and new bandage  If you have signs of infection, pus, redness, swelling or fever return to the ER

## 2014-08-19 ENCOUNTER — Other Ambulatory Visit: Payer: Self-pay | Admitting: Primary Care

## 2014-08-19 ENCOUNTER — Telehealth: Payer: Self-pay | Admitting: Primary Care

## 2014-08-19 ENCOUNTER — Ambulatory Visit: Payer: Self-pay | Admitting: Primary Care

## 2014-08-19 DIAGNOSIS — J45909 Unspecified asthma, uncomplicated: Secondary | ICD-10-CM

## 2014-08-19 DIAGNOSIS — E039 Hypothyroidism, unspecified: Secondary | ICD-10-CM

## 2014-08-19 DIAGNOSIS — F319 Bipolar disorder, unspecified: Secondary | ICD-10-CM

## 2014-08-19 MED ORDER — DIVALPROEX SODIUM 500 MG PO TBEC *I*
500.0000 mg | DELAYED_RELEASE_TABLET | Freq: Two times a day (BID) | ORAL | Status: DC
Start: 2014-08-19 — End: 2014-11-27

## 2014-08-19 MED ORDER — ALBUTEROL SULFATE HFA 108 (90 BASE) MCG/ACT IN AERS *I*
INHALATION_SPRAY | RESPIRATORY_TRACT | Status: DC
Start: 2014-08-19 — End: 2014-12-19

## 2014-08-19 MED ORDER — QUETIAPINE FUMARATE 300 MG PO TABS *I*
600.0000 mg | ORAL_TABLET | Freq: Every evening | ORAL | Status: DC
Start: 2014-08-19 — End: 2014-08-25

## 2014-08-19 MED ORDER — RISPERIDONE 2 MG PO TABS *I*
2.0000 mg | ORAL_TABLET | Freq: Every day | ORAL | Status: DC
Start: 2014-08-19 — End: 2014-11-28

## 2014-08-19 MED ORDER — LEVOTHYROXINE SODIUM 75 MCG PO TABS *I*
ORAL_TABLET | ORAL | Status: DC
Start: 2014-08-19 — End: 2014-11-27

## 2014-08-19 MED ORDER — MIRTAZAPINE 30 MG PO TABS *I*
30.0000 mg | ORAL_TABLET | Freq: Every evening | ORAL | Status: DC
Start: 2014-08-19 — End: 2014-11-27

## 2014-08-19 NOTE — Telephone Encounter (Signed)
Pharm faxes requested PA for     Quetiapine Fumarate 300 mg tab  Qty: 60 / 30 day supply    Reject messages: Prescription solutions (WUJ#[id number](BIN#[id number])  Pa required, Call [phone] NF-5 DS Submit PA # [id number] PA Type 8    Request in folder

## 2014-08-19 NOTE — Telephone Encounter (Signed)
Pt's truck broke down in [place] and could not make his appt today  He asks for a few month refills  Writer told him he needs to r/s appt

## 2014-08-22 NOTE — Telephone Encounter (Signed)
Ok I will let pharmacy know. Is there a way to put in chart or med list so that we don't refill it again?

## 2014-08-22 NOTE — Telephone Encounter (Signed)
No need to get approved.  He needs to go through psychiatry

## 2014-08-22 NOTE — Telephone Encounter (Signed)
We have not been able to get this approved in the past - due to patient also being on risperidone and this was initially a bridge script.   But looks like you prescribed again.Timothy French. Please advise. [employer] has denied multiple times.

## 2014-11-26 ENCOUNTER — Other Ambulatory Visit: Payer: Self-pay | Admitting: Primary Care

## 2014-11-26 DIAGNOSIS — E039 Hypothyroidism, unspecified: Secondary | ICD-10-CM

## 2014-11-26 DIAGNOSIS — F319 Bipolar disorder, unspecified: Secondary | ICD-10-CM

## 2014-11-26 NOTE — Telephone Encounter (Signed)
Pt requesting refills of   Depakote  Seroquel  Remeron  Levothyroxine  Risperdal    Pt is scheduled to see PCP 12/19/14 @ 2:10pm      This report was requested by: Zonia Kief  Reference #: [id number]   Others' Prescriptions  Patient Name: Timothy French Birth Date: [birth year]   Address: [address] [place], [place] [address] Sex: Male     Rx Written Rx Dispensed Drug Quantity Days Supply Prescriber Name     06/19/2014 06/19/2014 oxycodone-acetaminophen 5-325 3 12 Renaldo Reel MD     * - Drugs marked with an asterisk are compound drugs. If the compound drug is made up of more than one controlled substance, then each controlled substance will be a separate row in the table.

## 2014-11-27 MED ORDER — LEVOTHYROXINE SODIUM 75 MCG PO TABS *I*
ORAL_TABLET | ORAL | Status: DC
Start: 2014-11-27 — End: 2015-10-29

## 2014-11-27 MED ORDER — DIVALPROEX SODIUM 500 MG PO TBEC *I*
500.0000 mg | DELAYED_RELEASE_TABLET | Freq: Two times a day (BID) | ORAL | Status: DC
Start: 2014-11-27 — End: 2015-12-29

## 2014-11-27 MED ORDER — MIRTAZAPINE 30 MG PO TABS *I*
30.0000 mg | ORAL_TABLET | Freq: Every evening | ORAL | Status: DC
Start: 2014-11-27 — End: 2014-12-19

## 2014-11-27 NOTE — Telephone Encounter (Signed)
I put in refills on the ones that are prescribed by Rob, looks like there were some issues with the antipsychotics risperdal and seroquel, so these should be addressed by psych.

## 2014-11-28 MED ORDER — RISPERIDONE 2 MG PO TABS *I*
2.0000 mg | ORAL_TABLET | Freq: Every day | ORAL | Status: DC
Start: 2014-11-28 — End: 2016-06-08

## 2014-11-28 NOTE — Telephone Encounter (Signed)
Left msg. When pt calls back please ask if he has a psychiatrist. If not ask if Dr Gilford Rile has always prescribed his psych meds.

## 2014-11-28 NOTE — Telephone Encounter (Signed)
Left msg stating that he didn't need to arrive to the 1.20.16 appt and to just keep the original appt on the 29th.     Stated that the script was called into the pharmacy and if he had any questions to contact the office

## 2014-11-28 NOTE — Telephone Encounter (Signed)
Please arrange f/u  r

## 2014-11-28 NOTE — Telephone Encounter (Signed)
Called Timothy French to schedule an appt per Dr. Jonny RuizFortuna  - scheduled an appt on 1.20.16 @ 7:50 am in an acute slot.     Has an appt scheduled also for 1.29.16 with Dr.[name]  For FUV meds     He would like to know if you need to see him before prescribing him the requested medications - tried to offer the 3:20 pm slot today [place] and would be her after the 3:20 time closer to 4 pm      Please advise.

## 2014-11-28 NOTE — Telephone Encounter (Signed)
I called in the rx.  He should keep the reg appt this month.  i would cancel the acute

## 2014-11-28 NOTE — Telephone Encounter (Deleted)
Left msg. When pt calls back please ask if he has a psychiatrist. If not ask if Dr Gilford Rile has always prescribed his psych meds.

## 2014-11-28 NOTE — Telephone Encounter (Signed)
Calling back and he has stated since he only has medicaid insurance at this time - the psych facility will not accept that type of insurance. He will be looking for a psychatrist once his insurance kicks in "per pt by next month - Feburary"    Calling because he hasn't received risperiDONE (RISPERDAL) 2 MG tablet  & seroquel at the pharmacy listed on file?

## 2014-12-10 ENCOUNTER — Ambulatory Visit: Payer: Self-pay | Admitting: Primary Care

## 2014-12-19 ENCOUNTER — Ambulatory Visit: Payer: Self-pay | Admitting: Primary Care

## 2014-12-19 ENCOUNTER — Encounter: Payer: Self-pay | Admitting: Primary Care

## 2014-12-19 VITALS — BP 130/76 | HR 100 | Ht 66.0 in | Wt 206.0 lb

## 2014-12-19 DIAGNOSIS — I10 Essential (primary) hypertension: Secondary | ICD-10-CM

## 2014-12-19 DIAGNOSIS — R635 Abnormal weight gain: Secondary | ICD-10-CM

## 2014-12-19 DIAGNOSIS — R7303 Prediabetes: Secondary | ICD-10-CM

## 2014-12-19 DIAGNOSIS — J45909 Unspecified asthma, uncomplicated: Secondary | ICD-10-CM

## 2014-12-19 DIAGNOSIS — E039 Hypothyroidism, unspecified: Secondary | ICD-10-CM

## 2014-12-19 DIAGNOSIS — F319 Bipolar disorder, unspecified: Secondary | ICD-10-CM

## 2014-12-19 DIAGNOSIS — M545 Low back pain, unspecified: Secondary | ICD-10-CM

## 2014-12-19 MED ORDER — ALBUTEROL SULFATE HFA 108 (90 BASE) MCG/ACT IN AERS *I*
INHALATION_SPRAY | RESPIRATORY_TRACT | Status: DC
Start: 2014-12-19 — End: 2016-04-11

## 2014-12-19 MED ORDER — MIRTAZAPINE 15 MG PO TABS *I*
15.0000 mg | ORAL_TABLET | Freq: Every evening | ORAL | Status: DC
Start: 2014-12-19 — End: 2016-05-11

## 2014-12-19 MED ORDER — VENLAFAXINE HCL 37.5 MG PO CP24 *I*
37.5000 mg | ORAL_CAPSULE | Freq: Every day | ORAL | Status: DC
Start: 2014-12-19 — End: 2016-06-28

## 2014-12-19 NOTE — Progress Notes (Signed)
~[facility]~    SUBJECTIVE     1) Back Injury  --Continues to have back pain  --Still located in lower back region  --no radiation in general, but occ radiation to buttocks and left leg  --no change in strength in legs  --no fevers  --had xrays done in the past  --treated with flexeril in past    2)  Asthma  --needs refills on albuterol  --reports that he is a bit more wheezy than baseline    3) Thyroid  --on levothyroxine  --reports adherences    4) ETOH  --None -- last drink Mar 24 2014    5) Mental health  --on remeron, depakote, seroquel,   --reports that he is doing better      Pain Intensity:   Pain    12/19/14 1414   PainSc:   8   PainLoc: Back         Medications Reviewed    OBJECTIVE     BP 130/76 mmHg   Pulse 100   Ht 1.676 m (5\' 6" )   Wt 93.441 kg (206 lb)   BMI 33.27 kg/m2  General Appearance: No distress  RRR, no m  CTA  Back:  Mild tenderness in the right paraspinal musculature.  5 out of 5 strength bilaterally.  No bony tenderness       ASSESSMENT / DIAGNOSIS     1. Hypothyroidism  TSH   2. Bipolar affective disorder  mirtazapine (REMERON) 15 MG tablet    venlafaxine (EFFEXOR-XR) 37.5 MG 24 hr capsule   3. Essential hypertension  Comprehensive metabolic panel   4. Asthma, unspecified asthma severity, uncomplicated  albuterol HFA (VENTOLIN HFA) 108 (90 BASE) MCG/ACT inhaler   5. Weight gain  Comprehensive metabolic panel    Hemoglobin A1c    Lipid add Rfx to Drt LDL if Trig >400    TSH   6. Prediabetes  Hemoglobin A1c   7. Lumbar back pain  AMB REFERRAL TO SPINE SURGERY    AMB REFERRAL TO PHYS / OCC THERAPY       ORDERS AND PLAN     Hypothyroidism  - TSH; Future    Bipolar affective disorder  - mirtazapine (REMERON) 15 MG tablet; Take 1 tablet (15 mg total) by mouth nightly   at bedtime.  - venlafaxine (EFFEXOR-XR) 37.5 MG 24 hr capsule; Take 1 capsule (37.5 mg total) by mouth daily   Swallow whole. Do not crush or chew.    Essential hypertension  - Comprehensive metabolic panel;  Future    Asthma, unspecified asthma severity, uncomplicated  - albuterol HFA (VENTOLIN HFA) 108 (90 BASE) MCG/ACT inhaler; INHALE 2 PUFFS EVERY 4 HOURS AS NEEDED FOR COUGH AND WHEEZE.    Weight gain  - Comprehensive metabolic panel; Future  - Hemoglobin A1c; Future  - Lipid add Rfx to Drt LDL if Trig >400; Future  - TSH; Future    Prediabetes -- discussed walking.  Discussed lifestyle modifications; recommend checking labs.  - Hemoglobin A1c; Future    Lumbar back pain -- Remains most consistent with muscular axial back pain.    There is perhaps an intermittent radicular element.   At this point, I continue to recommend conservative management with physical therapy, NSAIDs, and Flexeril.   Will refer to spine Center   - AMB REFERRAL TO SPINE SURGERY  - AMB REFERRAL TO PHYS / OCC THERAPY    Other Orders  - quetiapine (SEROQUEL) 300 MG tablet; Take 600 mg  by mouth every evening      --Patient instructed to call if symptoms are not improving or worsening  --Follow-up arranged    Signed: Madolyn Frieze, MD on 12/19/2014 at 2:46 PM  [facility]

## 2014-12-23 ENCOUNTER — Telehealth: Payer: Self-pay | Admitting: Primary Care

## 2014-12-23 NOTE — Telephone Encounter (Signed)
Please redo referral for spine surgery, unable to send to correct dept because it was also listed as pain treatment. Leave dept blank and I will route to the correct dept. Thank you.

## 2014-12-24 NOTE — Telephone Encounter (Signed)
The order is for to spine center for non-operative care.   I followed the erecord process.

## 2014-12-25 ENCOUNTER — Telehealth: Payer: Self-pay | Admitting: Primary Care

## 2014-12-25 DIAGNOSIS — K219 Gastro-esophageal reflux disease without esophagitis: Secondary | ICD-10-CM

## 2014-12-25 MED ORDER — OMEPRAZOLE 20 MG PO CPDR *I*
20.0000 mg | DELAYED_RELEASE_CAPSULE | Freq: Every day | ORAL | Status: DC
Start: 2014-12-25 — End: 2015-12-29

## 2014-12-25 MED ORDER — CYCLOBENZAPRINE HCL 10 MG PO TABS *I*
10.0000 mg | ORAL_TABLET | Freq: Three times a day (TID) | ORAL | Status: DC | PRN
Start: 2014-12-25 — End: 2018-03-01

## 2014-12-25 NOTE — Telephone Encounter (Signed)
Called and made aware that scripts are at his pharmacy

## 2014-12-25 NOTE — Telephone Encounter (Signed)
Confidential Drug Utilization Report  Search Terms: Timothy French, Nov 19, 1966 Search Date: 12/25/2014 09:45:36 AM Searching on behalf of: [id number] - [name]   The Drug Utilization Report below displays all of the controlled substance prescriptions, if any, that your patient has filled in the last six months. The information displayed on this report is compiled from pharmacy submissions to the Department, and accurately reflects the information as submitted by the pharmacies.  This report was requested by: Georgena SpurlingEvelyn Novellin   Reference #: [id number]   There are no results for the search terms that you entered.

## 2014-12-25 NOTE — Telephone Encounter (Signed)
Prescriptions sent

## 2014-12-25 NOTE — Telephone Encounter (Signed)
Would like to know if you would be able to send in the scripts for flexeril and heart burn medication to the pharmacy listed on file?

## 2014-12-30 ENCOUNTER — Telehealth: Payer: Self-pay | Admitting: Pain Medicine

## 2014-12-30 NOTE — Telephone Encounter (Signed)
Ok to schedule.

## 2015-01-05 ENCOUNTER — Ambulatory Visit: Payer: Self-pay | Admitting: Pain Medicine

## 2015-01-05 NOTE — Progress Notes (Signed)
NO show

## 2015-01-06 ENCOUNTER — Ambulatory Visit: Payer: Self-pay | Admitting: Primary Care

## 2015-10-20 ENCOUNTER — Encounter: Payer: Self-pay | Admitting: Primary Care

## 2015-10-26 ENCOUNTER — Encounter: Payer: Self-pay | Admitting: Primary Care

## 2015-10-29 ENCOUNTER — Other Ambulatory Visit: Payer: Self-pay | Admitting: Primary Care

## 2015-10-29 DIAGNOSIS — E039 Hypothyroidism, unspecified: Secondary | ICD-10-CM

## 2015-10-29 DIAGNOSIS — F319 Bipolar disorder, unspecified: Secondary | ICD-10-CM

## 2015-10-29 MED ORDER — LEVOTHYROXINE SODIUM 75 MCG PO TABS *I*
ORAL_TABLET | ORAL | 1 refills | Status: DC
Start: 2015-10-29 — End: 2015-11-03

## 2015-10-29 NOTE — Telephone Encounter (Signed)
Pt calling requesting refill,  Per pt, he scheduled an appt with PCP over a month ago for a med check  i was unable to verify this.  No appt was made  I was able to schedule the pt for an appt on 12/13 for a med check

## 2015-11-03 ENCOUNTER — Encounter: Payer: Self-pay | Admitting: Primary Care

## 2015-11-03 ENCOUNTER — Ambulatory Visit: Payer: Self-pay | Admitting: Primary Care

## 2015-11-03 VITALS — BP 136/84 | HR 102 | Ht 66.0 in | Wt 245.0 lb

## 2015-11-03 DIAGNOSIS — R0681 Apnea, not elsewhere classified: Secondary | ICD-10-CM

## 2015-11-03 DIAGNOSIS — I10 Essential (primary) hypertension: Secondary | ICD-10-CM

## 2015-11-03 DIAGNOSIS — F319 Bipolar disorder, unspecified: Secondary | ICD-10-CM

## 2015-11-03 DIAGNOSIS — R635 Abnormal weight gain: Secondary | ICD-10-CM

## 2015-11-03 DIAGNOSIS — Z23 Encounter for immunization: Secondary | ICD-10-CM

## 2015-11-03 DIAGNOSIS — R0683 Snoring: Secondary | ICD-10-CM

## 2015-11-03 DIAGNOSIS — R7303 Prediabetes: Secondary | ICD-10-CM

## 2015-11-03 DIAGNOSIS — E039 Hypothyroidism, unspecified: Secondary | ICD-10-CM

## 2015-11-03 LAB — COMPREHENSIVE METABOLIC PANEL
ALT: 52 U/L — ABNORMAL HIGH (ref 0–50)
AST: 34 U/L (ref 0–50)
Albumin: 4.4 g/dL (ref 3.5–5.2)
Alk Phos: 77 U/L (ref 40–130)
Anion Gap: 17 — ABNORMAL HIGH (ref 7–16)
Bilirubin,Total: <0.2 mg/dL (ref 0.0–1.2)
CO2: 25 mmol/L (ref 20–28)
Calcium: 10.2 mg/dL (ref 8.6–10.2)
Chloride: 100 mmol/L (ref 96–108)
Creatinine: 1.1 mg/dL (ref 0.67–1.17)
GFR,Black: 90 *
GFR,Caucasian: 78 *
Glucose: 76 mg/dL (ref 60–99)
Lab: 13 mg/dL (ref 6–20)
Potassium: 4.9 mmol/L (ref 3.3–5.1)
Sodium: 142 mmol/L (ref 133–145)
Total Protein: 7.8 g/dL — ABNORMAL HIGH (ref 6.3–7.7)

## 2015-11-03 LAB — LIPID PANEL
Chol/HDL Ratio: 4.9
Cholesterol: 192 mg/dL
HDL: 39 mg/dL
LDL Calculated: 106 mg/dL
Non HDL Cholesterol: 153 mg/dL
Triglycerides: 234 mg/dL — AB

## 2015-11-03 LAB — CBC AND DIFFERENTIAL
Baso # K/uL: 0.1 10*3/uL (ref 0.0–0.1)
Basophil %: 1 %
Eos # K/uL: 0.1 10*3/uL (ref 0.0–0.5)
Eosinophil %: 1.3 %
Hematocrit: 45 % (ref 40–51)
Hemoglobin: 15 g/dL (ref 13.7–17.5)
IMM Granulocytes #: 0.1 10*3/uL (ref 0.0–0.1)
IMM Granulocytes: 1 %
Lymph # K/uL: 2.7 10*3/uL (ref 1.3–3.6)
Lymphocyte %: 34.4 %
MCH: 31 pg/cell (ref 26–32)
MCHC: 34 g/dL (ref 32–37)
MCV: 93 fL — ABNORMAL HIGH (ref 79–92)
Mono # K/uL: 0.9 10*3/uL — ABNORMAL HIGH (ref 0.3–0.8)
Monocyte %: 11.9 %
Neut # K/uL: 4 10*3/uL (ref 1.8–5.4)
Nucl RBC # K/uL: 0 10*3/uL (ref 0.0–0.0)
Nucl RBC %: 0 /100 WBC (ref 0.0–0.2)
Platelets: 234 10*3/uL (ref 150–330)
RBC: 4.8 MIL/uL (ref 4.6–6.1)
RDW: 13.2 % (ref 11.6–14.4)
Seg Neut %: 50.4 %
WBC: 7.9 10*3/uL (ref 4.2–9.1)

## 2015-11-03 LAB — TSH: TSH: 2.59 u[IU]/mL (ref 0.27–4.20)

## 2015-11-03 MED ORDER — LISINOPRIL 10 MG PO TABS *I*
10.0000 mg | ORAL_TABLET | Freq: Every day | ORAL | 5 refills | Status: DC
Start: 2015-11-03 — End: 2015-12-29

## 2015-11-03 MED ORDER — LEVOTHYROXINE SODIUM 75 MCG PO TABS *I*
ORAL_TABLET | ORAL | 1 refills | Status: DC
Start: 2015-11-03 — End: 2015-12-29

## 2015-11-03 NOTE — Progress Notes (Signed)
~  [facility]~    SUBJECTIVE     Snoring  --Snoring loudly  --reports apneic episodes at night -- documented by fiance  --reports daytime fatigue    Thyroid  --on levothyroxine; off of thyroid medication for 2 months, just restarted    ETOH  --None -- last drink Mar 24 2014 - incarcerated for DWI    Asthma  --"not so good"  --needs inhalers  --smoked 1/2 ppd    HTN  --no CP, no SOB  --of of antihypertensives    Mental health  --on remeron, depakote, Seroquel  --[facility]      Pain Intensity:   Pain    11/03/15 1251   PainSc:   0 - No pain         Medications Reviewed    OBJECTIVE     Visit Vitals    BP 136/84    Pulse 102    Ht 1.676 m (5\' 6" )    Wt 111.1 kg (245 lb)    BMI 39.54 kg/m2     General Appearance: No distress  RRR, no m  CTA       ASSESSMENT / DIAGNOSIS      Blood pressure not adequately controlled based on outpatient readings.  Recommend initiation of lisinopril 10 mg.  Recommend rechecking labs, however thyroid is not likely at steady state at this point.  Given snoring and witnessed apneas, recommend sleep study.  He also has excessive daytime sleepiness with the sleepiness scale of 14 and morning headaches.    ORDERS AND PLAN     HTN (hypertension)  -     lisinopril (PRINIVIL,ZESTRIL) 10 MG tablet; Take 1 tablet (10 mg total) by mouth daily  -     Comprehensive metabolic panel; Future  -     CBC and differential; Future  -     Hemoglobin A1c; Future  -     Lipid add Rfx to Drt LDL if Trig >400; Future    Need for influenza vaccination  -     Flu Vaccine Quadrivalent greater than or equal to 3yo preservative free IM (AMBULATORY USE ONLY)    Hypothyroidism, unspecified type  -     levothyroxine (SYNTHROID, LEVOTHROID) 75 MCG tablet; TAKE 1 TABLET DAILY.  -     TSH; Future    Bipolar affective disorder, remission status unspecified  -     levothyroxine (SYNTHROID, LEVOTHROID) 75 MCG tablet; TAKE 1 TABLET DAILY.    Weight gain  -     Hemoglobin A1c; Future  -     TSH;  Future    Prediabetes  -     CBC and differential; Future  -     Hemoglobin A1c; Future  -     Lipid add Rfx to Drt LDL if Trig >400; Future  -     TSH; Future    Snoring  -     Sleep Study; Future    Apnea  -     Sleep Study; Future          --Patient instructed to call if symptoms are not improving or worsening  --Follow-up arranged    Signed: Madolyn FriezeOBERT J Charlaine Utsey, MD on 11/03/2015 at 1:17 PM  [facility]

## 2015-11-04 LAB — HEMOGLOBIN A1C: Hemoglobin A1C: 5.5 % (ref 4.0–6.0)

## 2015-11-12 ENCOUNTER — Telehealth: Payer: Self-pay | Admitting: Primary Care

## 2015-11-12 NOTE — Telephone Encounter (Signed)
Pt calling in regards to the home sleep study  I see that there's an order for the study, but it reads cancelled  Please advise

## 2015-11-18 NOTE — Telephone Encounter (Signed)
He should have an active home sleep study.  We also completed the paperwork and sent this up front for the sleep lab.

## 2015-12-01 ENCOUNTER — Ambulatory Visit: Payer: Self-pay | Admitting: Primary Care

## 2015-12-29 ENCOUNTER — Ambulatory Visit: Payer: Self-pay | Admitting: Primary Care

## 2015-12-29 ENCOUNTER — Encounter: Payer: Self-pay | Admitting: Primary Care

## 2015-12-29 VITALS — BP 126/76 | HR 108 | Ht 66.14 in | Wt 245.0 lb

## 2015-12-29 DIAGNOSIS — F319 Bipolar disorder, unspecified: Secondary | ICD-10-CM

## 2015-12-29 DIAGNOSIS — I10 Essential (primary) hypertension: Secondary | ICD-10-CM

## 2015-12-29 DIAGNOSIS — E039 Hypothyroidism, unspecified: Secondary | ICD-10-CM

## 2015-12-29 DIAGNOSIS — R0683 Snoring: Secondary | ICD-10-CM

## 2015-12-29 DIAGNOSIS — K219 Gastro-esophageal reflux disease without esophagitis: Secondary | ICD-10-CM

## 2015-12-29 MED ORDER — LISINOPRIL 10 MG PO TABS *I*
10.0000 mg | ORAL_TABLET | Freq: Every day | ORAL | 5 refills | Status: DC
Start: 2015-12-29 — End: 2016-08-10

## 2015-12-29 MED ORDER — DIVALPROEX SODIUM 500 MG PO TBEC *I*
500.0000 mg | DELAYED_RELEASE_TABLET | Freq: Two times a day (BID) | ORAL | 1 refills | Status: DC
Start: 2015-12-29 — End: 2016-06-28

## 2015-12-29 MED ORDER — LEVOTHYROXINE SODIUM 75 MCG PO TABS *I*
ORAL_TABLET | ORAL | 1 refills | Status: DC
Start: 2015-12-29 — End: 2016-08-10

## 2015-12-29 MED ORDER — OMEPRAZOLE 20 MG PO CPDR *I*
20.0000 mg | DELAYED_RELEASE_CAPSULE | Freq: Every day | ORAL | 5 refills | Status: DC
Start: 2015-12-29 — End: 2016-08-10

## 2015-12-29 NOTE — Progress Notes (Signed)
~  [facility]~    SUBJECTIVE     Snoring  --Snoring loudly  --reports apneic episodes at night -- documented by fiance  --reports daytime fatigue    Thyroid  --on levothyroxine; off of thyroid medication for 2 months, just restarted    ETOH  --None -- last drink Mar 24 2014 - incarcerated for DWI    Asthma  --"not so good"  --needs inhalers  --smoked 1/2 ppd    HTN  --no CP, no SOB  --of of antihypertensives    Mental health  --on remeron, depakote, Seroquel  --[facility]      Pain Intensity:   Pain    12/29/15 1246   PainSc:   0 - No pain         Medications Reviewed    OBJECTIVE     Visit Vitals    BP 126/76    Pulse 108    Ht 1.68 m (5' 6.14")    Wt 111.1 kg (245 lb)    BMI 39.37 kg/m2     General Appearance: No distress  RRR, no m  CTA       ASSESSMENT / DIAGNOSIS      Blood pressure not adequately controlled based on outpatient readings.  Recommend initiation of lisinopril 10 mg.  Recommend rechecking labs, however thyroid is not likely at steady state at this point.  Given snoring and witnessed apneas, recommend sleep study.  He also has excessive daytime sleepiness with the sleepiness scale of 14 and morning headaches.    ORDERS AND PLAN     There are no diagnoses linked to this encounter.      --Patient instructed to call if symptoms are not improving or worsening  --Follow-up arranged    Signed: Madolyn Frieze, MD on 12/29/2015 at 12:57 PM  [facility]

## 2015-12-29 NOTE — Progress Notes (Signed)
~  [facility]~    SUBJECTIVE     Snoring  --Still snoring loudly and having apneic episodes at night  --has not had sleep study yet    Thyroid  --prev off of levothyroxine;   --back on medication for about 3 months    HTN  --no CP, no SOB  --started on lisinopril at last appt    Mental health  --on remeron, depakote, Seroquel  --[facility]      Pain Intensity:   Pain    12/29/15 1246   PainSc:   0 - No pain         Medications Reviewed    OBJECTIVE     Visit Vitals    BP 126/76    Pulse 108    Ht 1.68 m (5' 6.14")    Wt 111.1 kg (245 lb)    BMI 39.37 kg/m2     General Appearance: No distress  RRR, no m  CTA       ASSESSMENT / DIAGNOSIS      Recommend sleep study based upon morning headaches, daytime fatigue, loud snoring, and observed apneic events at night.    Hypertension better controlled.  Recommend continuing lisinopril as below.    Hypothyroidism well controlled.  Labs reviewed      ORDERS AND PLAN     Snoring  -     Sleep Study; Future    Gastroesophageal reflux disease without esophagitis  -     omeprazole (PRILOSEC) 20 MG capsule; Take 1 capsule (20 mg total) by mouth daily (before breakfast)    HTN (hypertension)  -     lisinopril (PRINIVIL,ZESTRIL) 10 MG tablet; Take 1 tablet (10 mg total) by mouth daily    Hypothyroidism, unspecified type  -     levothyroxine (SYNTHROID, LEVOTHROID) 75 MCG tablet; TAKE 1 TABLET DAILY.    Bipolar affective disorder, remission status unspecified  -     levothyroxine (SYNTHROID, LEVOTHROID) 75 MCG tablet; TAKE 1 TABLET DAILY.  -     divalproex (DEPAKOTE) 500 MG delayed release tablet; Take 1 tablet (500 mg total) by mouth 2 times daily          --Patient instructed to call if symptoms are not improving or worsening  --Follow-up arranged    Signed: Madolyn Frieze, MD on 12/29/2015 at [facility] PM  [facility]

## 2016-01-07 ENCOUNTER — Telehealth: Payer: Self-pay | Admitting: Primary Care

## 2016-01-07 ENCOUNTER — Encounter: Payer: Self-pay | Admitting: Primary Care

## 2016-01-07 NOTE — Telephone Encounter (Signed)
Faxed note, and Epworth scale

## 2016-01-19 ENCOUNTER — Telehealth: Payer: Self-pay

## 2016-01-19 NOTE — Telephone Encounter (Signed)
[facility] SLEEP DISORDERS [facility]      DIRECT REFERRAL SLEEP INTAKE FORM    Patient Name:  Timothy French  MRN #:  [id number]  D.O.B.:  1966/02/15  Sex:    male  Height:   5 ft  6 in  Weight:  245 lbs    Date of Study:   01/22/2016  Study Type: HST-3pm    Referring MD: Jonny Ruiz        PCP:   Madolyn Frieze, MD     [phone]    Req & Orders Info:  completed     Problem List:   Patient Active Problem List   Diagnosis Code    Asthma J45.909    Hypothyroidism E03.9    Bipolar Disorder F30.9    Cervical Vertebral Fusion     Chronic Pain G89.29    Esophageal Reflux K21.9    Nicotine Dependence F17.200    Cervicalgia M54.2    Difficulty Breathing (Dyspnea) R06.02    Joint Pain, Localized In The Hip M25.559    Backache M54.9    Carpal Tunnel Syndrome G56.00    ACP Staging Stage 1 Hypertension: 140-159 / 90-99 I10    Hypertension I10    Asthma J45.909    Bipolar affective F31.9       Medications:   Current Outpatient Prescriptions on File Prior to Visit   Medication Sig Dispense Refill    omeprazole (PRILOSEC) 20 MG capsule Take 1 capsule (20 mg total) by mouth daily (before breakfast) 30 capsule 5    lisinopril (PRINIVIL,ZESTRIL) 10 MG tablet Take 1 tablet (10 mg total) by mouth daily 30 tablet 5    levothyroxine (SYNTHROID, LEVOTHROID) 75 MCG tablet TAKE 1 TABLET DAILY. 30 tablet 1    divalproex (DEPAKOTE) 500 MG delayed release tablet Take 1 tablet (500 mg total) by mouth 2 times daily 60 tablet 1    cyclobenzaprine (FLEXERIL) 10 MG tablet Take 1 tablet (10 mg total) by mouth 3 times daily as needed   for muscle spasm 60 tablet 0    quetiapine (SEROQUEL) 300 MG tablet Take 600 mg by mouth every evening  0    mirtazapine (REMERON) 15 MG tablet Take 1 tablet (15 mg total) by mouth nightly   at bedtime. 10 tablet 1    venlafaxine (EFFEXOR-XR) 37.5 MG 24 hr capsule Take 1 capsule (37.5 mg total) by mouth daily   Swallow whole. Do not crush or chew. 30 capsule 5    albuterol HFA (VENTOLIN HFA) 108 (90  BASE) MCG/ACT inhaler INHALE 2 PUFFS EVERY 4 HOURS AS NEEDED FOR COUGH AND WHEEZE. 1 Inhaler 5    risperiDONE (RISPERDAL) 2 MG tablet Take 1 tablet (2 mg total) by mouth daily 30 tablet 1     No current facility-administered medications on file prior to visit.        Allergies:   Allergies   Allergen Reactions    Bee Venom      Created by Conversion - 0;     Tramadol Rash    No Known Drug Allergy      Created by Conversion - 0;     No Known Latex Allergy      Created by Conversion - 0;          Patient Employer:     Employment Status:       Usual Sleep/RiseTimes:  10pm to 7-8:30am  Usual Work Hours:  Sleep Problem/Reason for Referral:  Mack Guise, observed apneas/snoring      Tech Comments / Special Needs:         History Call Date = arrived 3/3    By:  Fredrik Rigger III

## 2016-01-22 ENCOUNTER — Ambulatory Visit: Payer: Self-pay

## 2016-01-22 NOTE — Progress Notes (Signed)
[facility]       HOME SLEEP STUDY POST TEST QUESTIONNAIRE      Patient Name:        Timmothy Eulerdward [place] Kalish    MRN #  [id number]      1. What was your approximate bedtime? _________________________      2.  How long did it take you to fall asleep? _________________________      3. How many times did you wake up? _________________________      a. Approximatley what time? / How long were you awake?    _____________/_______________    _____________/_______________    _____________/_______________    _____________/_______________    4. At what time did you get up for the day? __________________________    5. Any problems or comments?  __________________________               Patient signature:______________________   Date:_______________            Please Complete this page  &  Return with Sleep Study Case.[name].[name].Thank You!                                                                             [facility]    HOME SLEEP TESTING EQUIPMENT AGREEMENT FORM      PATIENT NAME:  Timmothy Eulerdward [place] Kalish     MRN #:    [id number]           PHONE NUMBER:   [phone]     ADDRESS:   [address]   [place] [id number]    I have received a Home Sleep Monitor (MediByte Jr.) from the [facility] and have been instructed how to use it.    Serial Number: [id number]    I have been informed that I am to wear this monitor for one night.    I have agreed to return this monitor to [address] on  ______________ between 6:00 AM and 4:30 PM     If you have any questions during the evening or night, please call  our office at [phone].    The monitor is to be dropped off at the front desk at [facility].  Please be sure that it is packaged in the case that was given to you.    This monitor is the property of [facility].  The value of this equipment is $2,495.  If you fail to return it by the date stated above, I understand that the Hospitals legal authorities will be  notified.      Failure to return this monitor is considered theft of hospital property,      Signed:  ________________________   Date: _________       Witness:  _______________________   Date: _________

## 2016-01-22 NOTE — Progress Notes (Signed)
[facility]           [facility] HOME SLEEP TEST FORM    Patient name: Timothy French    MRN # :  [id number]    ESS:   14   NECK:  17 "      What is your usual bedtime?   10:00 p.m.  What is your usual risetime?  7-8:30 a.m.  What time did you wake up today? 8:00 a.m.  How many hours of sleep last night/day? 7 hours    Plan on taking any naps later today?   Yes HOUR NAP IN EVENING    Plan on any alcohol or caffeine intake after dinner time? No    Will you be wearing an Oral Appliance during the study?  No    Current Outpatient Prescriptions on File Prior to Visit   Medication Sig Dispense Refill    omeprazole (PRILOSEC) 20 MG capsule Take 1 capsule (20 mg total) by mouth daily (before breakfast) 30 capsule 5    lisinopril (PRINIVIL,ZESTRIL) 10 MG tablet Take 1 tablet (10 mg total) by mouth daily 30 tablet 5    levothyroxine (SYNTHROID, LEVOTHROID) 75 MCG tablet TAKE 1 TABLET DAILY. 30 tablet 1    divalproex (DEPAKOTE) 500 MG delayed release tablet Take 1 tablet (500 mg total) by mouth 2 times daily 60 tablet 1    cyclobenzaprine (FLEXERIL) 10 MG tablet Take 1 tablet (10 mg total) by mouth 3 times daily as needed   for muscle spasm 60 tablet 0    quetiapine (SEROQUEL) 300 MG tablet Take 600 mg by mouth every evening  0    mirtazapine (REMERON) 15 MG tablet Take 1 tablet (15 mg total) by mouth nightly   at bedtime. 10 tablet 1    venlafaxine (EFFEXOR-XR) 37.5 MG 24 hr capsule Take 1 capsule (37.5 mg total) by mouth daily   Swallow whole. Do not crush or chew. 30 capsule 5    albuterol HFA (VENTOLIN HFA) 108 (90 BASE) MCG/ACT inhaler INHALE 2 PUFFS EVERY 4 HOURS AS NEEDED FOR COUGH AND WHEEZE. 1 Inhaler 5    risperiDONE (RISPERDAL) 2 MG tablet Take 1 tablet (2 mg total) by mouth daily 30 tablet 1     No current facility-administered medications on file prior to visit.        Patient Active Problem List   Diagnosis Code    Asthma J45.909    Hypothyroidism E03.9    Bipolar Disorder F30.9     Cervical Vertebral Fusion     Chronic Pain G89.29    Esophageal Reflux K21.9    Nicotine Dependence F17.200    Cervicalgia M54.2    Difficulty Breathing (Dyspnea) R06.02    Joint Pain, Localized In The Hip M25.559    Backache M54.9    Carpal Tunnel Syndrome G56.00    ACP Staging Stage 1 Hypertension: 140-159 / 90-99 I10    Hypertension I10    Asthma J45.909    Bipolar affective F31.9       Patient Statement    I have received a Home Sleep Monitor (Medi[employer] Jr.) from the [facility] and have been instructed how to use it.    Timothy French    Sleep Diary Entries    1)   What was your approximate bedtime? 9:30 p.m.    2)   How long did it take you to fall asleep? 15-20 minutes  3)  How many times  do you remember waking up? none     A) Approx. Time ? / How long were you awake?        4) What time did you finally wake up for the day?  7-8:00 a.m.    5)  Any problems or Comments?    no        Timothy French

## 2016-01-27 NOTE — Procedures (Addendum)
This is an unattended home sleep study.  The flow meter malfunctioned intermittently during the recording, but the overall study quality was adequate.  The study is consistent with severe obstructive sleep apnea.  The degree of sleep disordered breathing may be underrepresented, as the patient did not apparently sleep in the supine position during the recording.        This study was electronically signed by Fredrik CoveMICHAEL E Dinora Hemm, MD on 01/28/2016 at 12:12 PM.

## 2016-01-28 ENCOUNTER — Telehealth: Payer: Self-pay | Admitting: Primary Care

## 2016-01-28 NOTE — Telephone Encounter (Signed)
Please make sure the patient has follow-up with me to address his sleep apnea.

## 2016-01-28 NOTE — Telephone Encounter (Signed)
Pt has one scheduled for 03/21/20107.    Thank you!  Timothy CampanileSandy

## 2016-01-28 NOTE — Procedures (Signed)
Dear Timothy French:    Timothy EulerEdward Lyman French had a home apnea screen on March 3.    The full results of this study are detailed elsewhere.    IMPRESSION:  1.  This study is consistent with severe obstructive sleep apnea (unattended AHI 55, minimum oxygen saturation 76%, 16% of the recording at an oxygen saturation less than 90%).  The degree of sleep disordered breathing may be underrepresented, as the patient did not apparently sleep in the supine position during the recording.  2.  The overall study quality was adequate, although the nasal flow meter malfunction during points of the recording.      Thank you for allowing us to participate in your patient's evaluation.  Please do not hesitate to contact me with questions or concerns.

## 2016-02-09 ENCOUNTER — Ambulatory Visit: Payer: Self-pay | Admitting: Primary Care

## 2016-03-08 ENCOUNTER — Ambulatory Visit: Payer: Self-pay | Admitting: Otolaryngology

## 2016-03-22 ENCOUNTER — Ambulatory Visit: Payer: Medicaid Other | Admitting: Primary Care

## 2016-04-11 ENCOUNTER — Other Ambulatory Visit: Payer: Self-pay | Admitting: Primary Care

## 2016-04-11 DIAGNOSIS — J45909 Unspecified asthma, uncomplicated: Secondary | ICD-10-CM

## 2016-04-19 ENCOUNTER — Ambulatory Visit: Payer: Self-pay | Admitting: Otolaryngology

## 2016-05-10 ENCOUNTER — Ambulatory Visit: Payer: Self-pay | Admitting: Primary Care

## 2016-05-11 ENCOUNTER — Telehealth: Payer: Self-pay | Admitting: Primary Care

## 2016-05-11 ENCOUNTER — Other Ambulatory Visit
Admission: RE | Admit: 2016-05-11 | Discharge: 2016-05-11 | Disposition: A | Discharge status: Home / Self Care | Payer: Self-pay | Source: Ambulatory Visit | Attending: Primary Care | Admitting: Primary Care

## 2016-05-11 ENCOUNTER — Ambulatory Visit: Payer: Medicaid Other | Attending: Primary Care | Admitting: Primary Care

## 2016-05-11 ENCOUNTER — Encounter: Payer: Self-pay | Admitting: Primary Care

## 2016-05-11 VITALS — BP 120/84 | HR 64 | Ht 66.0 in | Wt 237.0 lb

## 2016-05-11 DIAGNOSIS — R635 Abnormal weight gain: Secondary | ICD-10-CM

## 2016-05-11 DIAGNOSIS — E039 Hypothyroidism, unspecified: Secondary | ICD-10-CM

## 2016-05-11 DIAGNOSIS — K429 Umbilical hernia without obstruction or gangrene: Secondary | ICD-10-CM

## 2016-05-11 DIAGNOSIS — M545 Low back pain, unspecified: Secondary | ICD-10-CM

## 2016-05-11 DIAGNOSIS — N649 Disorder of breast, unspecified: Secondary | ICD-10-CM

## 2016-05-11 DIAGNOSIS — G4733 Obstructive sleep apnea (adult) (pediatric): Secondary | ICD-10-CM

## 2016-05-11 DIAGNOSIS — M542 Cervicalgia: Secondary | ICD-10-CM

## 2016-05-11 LAB — CBC AND DIFFERENTIAL
Baso # K/uL: 0.1 10*3/uL (ref 0.0–0.1)
Basophil %: 0.8 %
Eos # K/uL: 0.2 10*3/uL (ref 0.0–0.5)
Eosinophil %: 2.4 %
Hematocrit: 45 % (ref 40–51)
Hemoglobin: 14.6 g/dL (ref 13.7–17.5)
IMM Granulocytes #: 0.1 10*3/uL (ref 0.0–0.1)
IMM Granulocytes: 1 %
Lymph # K/uL: 3.2 10*3/uL (ref 1.3–3.6)
Lymphocyte %: 44.1 %
MCH: 31 pg/cell (ref 26–32)
MCHC: 32 g/dL (ref 32–37)
MCV: 96 fL — ABNORMAL HIGH (ref 79–92)
Mono # K/uL: 0.9 10*3/uL — ABNORMAL HIGH (ref 0.3–0.8)
Monocyte %: 12.2 %
Neut # K/uL: 2.9 10*3/uL (ref 1.8–5.4)
Nucl RBC # K/uL: 0 10*3/uL (ref 0.0–0.0)
Nucl RBC %: 0.3 /100 WBC — ABNORMAL HIGH (ref 0.0–0.2)
Platelets: 219 10*3/uL (ref 150–330)
RBC: 4.7 MIL/uL (ref 4.6–6.1)
RDW: 13.9 % (ref 11.6–14.4)
Seg Neut %: 39.5 %
WBC: 7.4 10*3/uL (ref 4.2–9.1)

## 2016-05-11 LAB — LIPID PANEL
Chol/HDL Ratio: 5.6
Cholesterol: 211 mg/dL — AB
HDL: 38 mg/dL
LDL Calculated: 133 mg/dL
Non HDL Cholesterol: 173 mg/dL
Triglycerides: 201 mg/dL — AB

## 2016-05-11 LAB — COMPREHENSIVE METABOLIC PANEL
ALT: 52 U/L — ABNORMAL HIGH (ref 0–50)
AST: 39 U/L (ref 0–50)
Albumin: 4.4 g/dL (ref 3.5–5.2)
Alk Phos: 65 U/L (ref 40–130)
Anion Gap: 15 (ref 7–16)
Bilirubin,Total: 0.3 mg/dL (ref 0.0–1.2)
CO2: 25 mmol/L (ref 20–28)
Calcium: 9.8 mg/dL (ref 8.6–10.2)
Chloride: 100 mmol/L (ref 96–108)
Creatinine: 1.05 mg/dL (ref 0.67–1.17)
GFR,Black: 95 *
GFR,Caucasian: 82 *
Glucose: 72 mg/dL (ref 60–99)
Lab: 10 mg/dL (ref 6–20)
Potassium: 5.1 mmol/L (ref 3.3–5.1)
Sodium: 140 mmol/L (ref 133–145)
Total Protein: 7.8 g/dL — ABNORMAL HIGH (ref 6.3–7.7)

## 2016-05-11 LAB — TSH: TSH: 2.15 u[IU]/mL (ref 0.27–4.20)

## 2016-05-11 MED ORDER — AUTO-TITRATING CPAP MACHINE *A*
0 refills | Status: DC
Start: 2016-05-11 — End: 2016-06-08

## 2016-05-11 NOTE — Telephone Encounter (Signed)
Called pt about mammogram. The male that answered stated that the pt was not at home and will call back.

## 2016-05-11 NOTE — Telephone Encounter (Signed)
Physical therapy list and order given to the pt at check out. The pt is required to call and make an appt.

## 2016-05-11 NOTE — Progress Notes (Signed)
~[facility]~    SUBJECTIVE     Snoring  --Still  snoring loudly at night  --Continues to reports apneic episodes at night  -- per fiance  --continues to have fatigue    Thyroid  --on levothyroxine; off of thyroid medication for 2 months, just restarted    ETOH  --None   --Remains sober  --last drink Mar 24 2014     HTN  --no CP, no SOB  --of of antihypertensives    Mental health  -- depakote, Seroquel  --serqouel recently increased.   --noticed sig weight gain  --[facility]    Breast pain  --Reports nodule and some tenderness in his right breast  --No Galactorrhea    Back pain  --Pain present for several years.  Worse over the past several months.  Located in his lumbar region.  No change in strength in legs.  No fevers.  No bowel or bladder incontinence or retention.  --no sign radiation    Pain Intensity:   Pain    05/11/16 1101   PainSc:   9   PainLoc: Back         Medications Reviewed    OBJECTIVE     Visit Vitals    BP 120/84    Pulse 64    Ht 1.676 m ( )    Wt 107.5 kg (237 lb)    BMI 38.25 kg/m2     General Appearance: No distress  RRR, no m  CTA       ASSESSMENT / DIAGNOSIS     Recommend initiation of CPAP.    Stressed importance reviewed risks of untreated obstructive sleep apnea.      Discussed weight gain and expiratory concerns about his significant weight gain and developing gynecomastia setting of his increased dose of Seroquel.  Rec contacting psychiastrist to review    Breast lesion -- recommended mammogram and ultrasound.  Recommend reducing dose of Seroquel     ORDERS AND PLAN     OSA (obstructive sleep apnea)  -     auto-titrating CPAP (AUTOSET) machine; Use as directed.  Pressure 5-15 mmH2O.  Include tubing and mask.   Dx -- G47.33    Neck pain  -     AMB REFERRAL TO PHYS / OCC THERAPY  -     AMB REFERRAL TO PHYS / OCC THERAPY  -     SPINE CERVICAL 3 VIEWS; Future  -     SPINE LUMBAR MIN 4 VIEWS COMPLETE; Future    Lumbar back pain  -     AMB REFERRAL TO  PHYS / OCC THERAPY  -     AMB REFERRAL TO PHYS / OCC THERAPY  -     SPINE CERVICAL 3 VIEWS; Future  -     SPINE LUMBAR MIN 4 VIEWS COMPLETE; Future    Hypothyroidism, unspecified type  -     Comprehensive metabolic panel; Future  -     CBC and differential; Future  -     Hemoglobin A1c; Future  -     TSH; Future  -     Lipid add Rfx to Drt LDL if Trig >400; Future    Weight gain  -     Comprehensive metabolic panel; Future  -     CBC and differential; Future  -     Hemoglobin A1c; Future  -     TSH; Future  -     Lipid add  Rfx to Drt LDL if Trig >400; Future    Breast lesion  -     Mammography screening bilateral; Future  -     [facility] MAMMOGRAPHY  -     Mammography ultrasound breast RIGHT; Future    Umbilical hernia  -     AMB REFERRAL TO SURGERY        --Patient instructed to call if symptoms are not improving or worsening  --Follow-up arranged    Signed: Madolyn FriezeOBERT J Min Tunnell, MD on 05/13/2016 at 1:30 PM  [facility]

## 2016-05-12 LAB — HEMOGLOBIN A1C: Hemoglobin A1C: 5.6 % (ref 4.0–6.0)

## 2016-05-13 NOTE — Telephone Encounter (Signed)
Pt called back and Clinical research associatewriter gave [facility].

## 2016-05-13 NOTE — Telephone Encounter (Signed)
[facility] states that they need new referrals for pt for mammogram and ultrasound. It needs to state bilateral diagnostic mammogram and also ultrasound needs to state bilateral too. Please clarify per [facility], paperwork states both R side lump and then paperwork states Left so they just want to clarify which side or if both sides. Please advise.

## 2016-05-17 NOTE — Telephone Encounter (Signed)
Pt called in stating that [facility] called requesting a new referral for the patient. They need clarification on what procedure should be done on the patient. Should it be a bilateral diagnostic mammogram? Please clarify with [facility] and the patient on which procedural aspects of the case

## 2016-05-19 NOTE — Telephone Encounter (Signed)
Orders placed.

## 2016-05-23 ENCOUNTER — Ambulatory Visit: Payer: Self-pay

## 2016-05-25 ENCOUNTER — Ambulatory Visit
Admission: RE | Admit: 2016-05-25 | Discharge: 2016-05-25 | Disposition: A | Discharge status: Home / Self Care | Payer: Medicaid Other | Source: Ambulatory Visit | Attending: Primary Care | Admitting: Primary Care

## 2016-05-25 ENCOUNTER — Encounter: Payer: Self-pay | Admitting: Surgery

## 2016-05-25 ENCOUNTER — Ambulatory Visit: Payer: Medicaid Other | Admitting: Surgery

## 2016-05-25 VITALS — BP 136/71 | HR 100 | Temp 96.1°F | Resp 18 | Ht 66.0 in | Wt 232.0 lb

## 2016-05-25 DIAGNOSIS — M545 Low back pain: Secondary | ICD-10-CM | POA: Insufficient documentation

## 2016-05-25 DIAGNOSIS — K429 Umbilical hernia without obstruction or gangrene: Secondary | ICD-10-CM

## 2016-05-26 NOTE — Preop H&P (Signed)
Subjective:     Timothy French is a 50 y.o. male who presents at the request of Jonny RuizFortuna, Marveen Reeksobert J, MD  for evaluation of umbilical hernia. Symptoms were first noted 3 months ago.  Pain is intermittent. Lump is not reducible. Symptoms did not start at work. Patient has no symptoms of  chronic constipation, chronic cough, difficulty urinating. Patient does not have previous hx of hernia surgery.  Hernia has been present "forever".     Patient Active Problem List    Diagnosis Date Noted    Umbilical hernia 05/25/2016    Hypertension 05/14/2014    Asthma 05/14/2014    Bipolar affective 05/14/2014    ACP Staging Stage 1 Hypertension: 140-159 / 90-99 08/20/2010     Created by Conversion        Carpal Tunnel Syndrome 03/18/2010     Created by Conversion        Backache 08/06/2009     Created by Conversion        Joint Pain, Localized In The Hip 04/02/2009     Created by Conversion        Cervicalgia 03/13/2009     Created by Conversion        Difficulty Breathing (Dyspnea) 03/13/2009     Created by Conversion        Esophageal Reflux 02/11/2009     Created by Conversion        Nicotine Dependence 02/11/2009     Created by Conversion        Chronic Pain 12/08/2008     Created by Conversion        Cervical Vertebral Fusion 11/03/2008     Created by Conversion        Asthma 11/28/2007     Created by Conversion        Hypothyroidism 11/28/2007     Created by Conversion        Bipolar Disorder 11/28/2007     Created by Conversion         Past Medical History:   Diagnosis Date    Asthma     Bipolar affective     Carpal tunnel syndrome     Depression     Headache     History of stomach ulcers     Hypertension       Past Surgical History:   Procedure Laterality Date    BACK SURGERY      cervical fusion    NECK SURGERY  2012        (Not in a hospital admission)  Allergies   Allergen Reactions    Bee Venom      Created by Conversion - 0;     Tramadol Rash    No Known Drug Allergy      Created by  Conversion - 0;     No Known Latex Allergy      Created by Conversion - 0;       Social History   Substance Use Topics    Smoking status: Current Every Day Smoker     Packs/day: 1.00     Types: Cigarettes    Smokeless tobacco: Current User    Alcohol use No      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    Objective:     Visit Vitals    BP 136/71    Pulse 100    Temp 35.6 C (96.1 F)    Resp 18    Ht 1.676 m (5\' 6" )    Wt 105.2 kg (232 lb)    SpO2 94%    BMI 37.45 kg/m2       Gen: Alert and oriented. No acute distress.  Cardiac: Regular rate and rhythm  Abdomen: Soft. Non-distended. Non-tender.  Hernia is palpable without Valsalva.  Hernia is notreducible. Defect size approximately 1 cm.   Extremities: No peripheral edema noted bilaterally      Data Review:     Lab Results   Component Value Date    WBC 7.4 05/11/2016    HGB 14.6 05/11/2016    HCT 45 05/11/2016    MCV 96 (H) 05/11/2016    PLT 219 05/11/2016         Lab results: 05/11/16  1149   Sodium 140   Potassium 5.1   Chloride 100   CO2 25   UN 10   Creatinine 1.05   GFR,Caucasian 82   GFR,Black 95   Glucose 72   Calcium 9.8       No results found for: INR, PTI    Imaging:      Spine Cervical 3 Views    Result Date: 05/25/2016  Exam Site: [facility] 05/25/2016 1:08 PM SPINE CERVICAL 3 VIEWS  ORDERING CLINICAL INFORMATION: ERECORD: neck /back pain ADDITIONAL CLINICAL INFORMATION: None.  COMPARISON: X-rays of the cervical spine 08/24/2009  PROCEDURE: AP, lateral, lateral swimmer's, and  odontoid views of the cervical spine.  FINDINGS: There is no evidence of acute fracture or malalignment. Vertebral height is within normal limits. There is postoperative sequela of C5-C7 anterior spinal fusion without significant change in alignment. There is evidence of interval progress of diffusion. There is moderate arthritic change involving the remainder of the cervical spine with osteophyte formation and facet arthropathy which appears progressed.[name].      IMPRESSION:  Arthritic and postoperative change, as described.  END OF REPORT    Spine Lumbar Min 4 Views Complete    Result Date: 05/25/2016  Exam Site: [facility] 05/25/2016 1:08 PM  SPINE LUMBAR MIN 4 VIEWS  ORDERING CLINICAL INFORMATION:  ERECORD: neck /back pain ADDITIONAL CLINICAL INFORMATION:  None.  COMPARISON: Lumbar x-rays March 15, 2014  PROCEDURE: AP, lateral, right oblique, and left oblique views of the lumbosacral spine.  FINDINGS: There is no evidence of acute fracture or malalignment. Vertebral height is within normal limits. There is moderate degenerative change involving L5-S1 with disc space narrowing, osteophyte formation, and facet arthropathy. There are more mild degenerative changes involving the remainder of the lumbar spine. These findings are similar in appearance to the prior study.      IMPRESSION:  Arthritic change, as described.  END OF REPORT          Assessment:     1. Umbilical hernia  Plan:      1. Discussed possibility of incarceration, strangulation, enlargement in size over time, and the risk of emergency surgery in the face of strangulation.  Also discussed the risk of surgery including recurrence, use of prosthetic materials (mesh) and the risk of infection, and the possible need for re-operation and removal of mesh if used, along with possibility of post-op SBO or ileus.  Risks of neuralgia discussed.  The patient understands the risks, any and all questions were answered to the patient's  satisfaction.  2. Patient has elected to proceed with surgical treatment. Procedure will be scheduled.  Written consent was obtained.[name]

## 2016-06-07 ENCOUNTER — Ambulatory Visit: Payer: Self-pay | Admitting: Rehabilitative and Restorative Service Providers"

## 2016-06-07 DIAGNOSIS — M542 Cervicalgia: Secondary | ICD-10-CM

## 2016-06-07 DIAGNOSIS — S39012A Strain of muscle, fascia and tendon of lower back, initial encounter: Secondary | ICD-10-CM

## 2016-06-07 NOTE — Progress Notes (Deleted)
Department of Physical Medicine & Rehabilitation   Physical Therapy Initial Assessment     History     Diagnosis: back pain     Referring practitioner:     Next appointment: PRN     Onset date on symptoms/Date of Surgery:      Previous Treatments: none    Previous Functional Level: Independent     Home Living: Independent     Work Status: Not working     Sports/Activities: none     PMH:    Past Medical History:   Diagnosis Date    Asthma     Bipolar affective     Carpal tunnel syndrome     Depression     Headache     History of stomach ulcers     Hypertension      Past Surgical History:   Procedure Laterality Date    BACK SURGERY      cervical fusion    NECK SURGERY  2012       * Bold indicates co morbidities effecting treatment and recovery    Personal factors affecting treatment/recovery:   {desc; AMB personal factors affecting [phone]}    Co morbidities affecting treatment/recovery:   {desc; co morbidities affecting [phone]}    Clinical presentation:   {desc;clinical [phone]}     Patient complexity:   {Desc; low/moderate/high:110033} level as indicated by above stability of condition, personal factors, environmental factors and comorbidities in addition to their impairments found on physical exam.         Subjective     Pain:     0-10 Scale:   Pain Location: Back     Relevant symptoms: pain in LS:     Mechanism of injury/history of symptoms:    Symptoms worsen with/ difficulty with the following:: Static standing;Walking, Stairs    Symptoms improve with: Rest, Ice, Heat, Activity, Medication:     Able to do the following:: Static sitting    Objective     Observation: Posture-poor with a forward and rounded shoulders. Gait- Normal.     Palpation: Tender to palpation at LS    Sensation: No deficits noted     Coordination: Good     ROM:     ROM     Lumbar Spine: Impaired AROM Lumbar Spine   Extension: 100% decrease in motion   Right Lower Extremity: Full AROM RLE    Left Lower Extremity: Full AROM LLE     Strength:     STRENGTH     Right Lower Extremity: RLE Strength WFLs & able to perform ADL Tasks   Left Lower Extremity: LLE Strength WFLs & able to perform ADL tasks     Reflexes: intact BLEs     Special test:     Special Tests   Pearlean BrownieFaber: Negative   L3: Negative   Quadriceps: Negative   SLR: Negative   Thomas: Negative     Balance: Good     Functional assessment:     Functional Activities   Deviations: within normal limits     Functional Outcome Measure:  Patient Specific Functional Scale    ACTIVITY 0-10 SCALE   (0=unable to perform activity; 10= Able to perform activity at same level as before injury/problem)   Standing  5   walking 5             Endurance: good     Assessment     LS Strain.  Patient complexity:  {Desc; low/moderate/high:110033} level as indicated  by above personal factors, environmental factors and comorbidities in addition to their impairments found on physical exam.    Rehab potential/prognosis: good     Patient's understanding: good     Plan     Plan of care: Appropriate for PT     PT interventions: AROM/PROM/Therapeutic exercise, Cold, Heat, Home exercise program instruction, Patient/Family Education, Postural training/body Curator education, Strengthening     PT frequency: Once a week, Twice a week     PT duration: 4 weeks     Short-term goals ( two weeks)     1. Demonstrate an exercise program   2. Patient education about postural mechanics   3. Patient to sit erect 1 min.     Long-term goals ( four weeks)     1. Independent with home exercise program   2. Independent patient education about postural mechanics   3. Patient to stand 30 min. without difficulty   4. Patient ambulate 30 min. without difficulty     Patient Goals for Therapy: Home exercise program     Thank you for the referral. If you have any questions and/or concerns, please feel free to contact me at [phone].     Lovett Calender PT, DPT

## 2016-06-07 NOTE — Progress Notes (Signed)
Physical Therapy Daily Flowsheet:  *Please see Physical Therapy Exercise Flowsheet for details regarding exercises completed this session.*     06/07/16 0700   Overview   Diagnosis back / neck pain   Insurance Blue Choice Option   Script Date 06/07/16   Visit # 1   Precautions CS fusion   Pain Assessment   0-10 Scale 7   Pain Location Back;Neck   Pain Orientation Lower   Modalities   Time CS / LS 10 minutes   Patient Education   Educated in disease process Yes   Educated in home exercise program Yes   Time Calculation   PT Untimed Codes 25   PT Total Treatment 25   Plan and Onset date   Plan of Care Date 06/07/16   Onset Date 10/22/15   Treatment Start Date 06/07/16   Charges   CC Charges PT Eval Low complexity - code 7161       Lovett CalenderJoe Nimra Puccinelli PT, DPT

## 2016-06-07 NOTE — Progress Notes (Signed)
Physical Therapy Exercise Flowsheet:  *Please refer to Physical Therapy Daily Flowsheet for further details of this session.*     06/07/16 1500   Cervical Exercises   Turtle Exercise Comment 1 set, 10x, tol well   Upper Trapezius Stretch, Sitting Comment 1 set, 10x, tol well   Shoulder Exercises   Scapular Retraction, Standing Comment 1 set, 10x, tol well   Shoulder Circles Comment 1 set, 10x, tol well   Lumbar/Abdominal Exercises   Bridges w/ Pillow Squeeze Comment 1 set, 10x, tol well   Pelvic Rotation Comment 1 set, 10x, tol well   Single Knee To Chest w/ Straight Leg Raise Comment 1 set, 10x, tol well       Lovett CalenderJoe Brinleigh Tew PT, DPT

## 2016-06-07 NOTE — Progress Notes (Signed)
Department of Physical Medicine & Rehabilitation   Physical Therapy Initial Assessment     History     Diagnosis: neck / back pain     Referring practitioner:  Madolyn FriezeFortuna, Robert J, MD    Next appointment: PRN     Onset date on symptoms/Date of Surgery:  10/22/2015    Previous Treatments: none    Previous Functional Level: Independent     Home Living: Independent     Work Status: Disability    Sports/Activities: none     Past Medical History:   Diagnosis Date    Asthma     Bipolar affective     Carpal tunnel syndrome     Depression     Headache     History of stomach ulcers     Hypertension      Past Surgical History:   Procedure Laterality Date    BACK SURGERY      cervical fusion    NECK SURGERY  2012     * Bold indicates co morbidities affecting treatment and recovery    Clinical presentation:   stable    Patient complexity:     low level as indicated by above stability of condition, personal factors, environmental factors and comorbidities in addition to their impairments found on physical exam.      Subjective     Pain:     0-10 Scale: 7  Pain Location: Neck / Back     Relevant symptoms: pain in CS / LS:     Mechanism of injury/history of symptoms:   Progressive onset    Symptoms worsen with/ difficulty with the following: Static standing; Walking, turning head, Overhead/ Reaching ADLs    Symptoms improve with: Rest, Ice, Heat, Activity, and Medication    Able to do the following: Static sitting     Objective     Observation: Posture-poor with a forward and rounded shoulder. Gait- Normal.     Palpation: Tender to palpation at CS / LS     Sensation: No deficits noted     Coordination: Good     ROM:     ROM     Lumbar Spine: Impaired AROM Lumbar Spine   Extension: 100% decrease in motion   Right Lower Extremity: Full AROM RLE   Left Lower Extremity: Full AROM LLE   Cervical Spine: Impaired AROM Cervical Spine   Extension: 100% decrease in motion   Right Upper Extremity: Full AROM RUE   Left Upper  Extremity: Full AROM LUE     Strength:     STRENGTH     Right Lower Extremity: RLE Strength WFLs & able to perform ADL Tasks   Left Lower Extremity: LLE Strength WFLs & able to perform ADL tasks   Right Upper Extremity: RUE Strength WFLs & able to perform ADL Tasks   Left Upper Extremity: LUE Strength WFLs & able to perform ADL tasks       Reflexes: intact BLEs / BUEs    Special test:     Special Tests   Faber: Negative   L3: Negative   Quadriceps: Negative   SLR: Negative   Thomas: Negative     Foraminal closure Negative    Balance: Good     Functional assessment:     Functional Activities   Deviations: within normal limits     Endurance: good     Assessment     Timothy French is a 50 y.o. male who presents to physical therapy  with pain, decreased ROM, and postural gait deviations secondary to signs and symptoms consistent with Diagnosis: back / neck stenosis.  Skilled physical therapy services indicated to increase function and to address goals below.      Rehab potential/prognosis: good     Patient understands: good     Plan     Plan of care: Appropriate for PT     PT interventions: AROM/PROM/Therapeutic exercise, Cold, Heat, Home exercise program instruction, Patient/Family Education, Postural training/body Curator education, Strengthening     PT frequency: Once a week, twice a week     PT duration: 4 weeks     Short-term goals (two weeks)     1. Demonstrate an exercise program   2. Patient education about postural mechanics   3. Patient to sit erect 1 min.     Long-term goals (four weeks)     1. Independent with home exercise program   2. Independent patient education about postural mechanics   3. Patient to stand 30 min. without difficulty   4. Patient ambulate 30 min. without difficulty   5. Patient to turn head without difficulty  6. Resume reaching/overhead ADLs    Patient Goals for Therapy: Home exercise program     Thank you for the referral. If you have any questions and/or concerns, please feel  free to contact me at [phone].     Lovett Calender PT, DPT

## 2016-06-07 NOTE — Patient Instructions (Signed)
Patient was instructed to perform their home exercise program two to three times per day to tolerance. Each exercise should be performed  10 times each or to tolerance. You should utilize moist heat at home for approximately 15 min. if you have pain. If you have any questions please feel free to contact me at [phone] between the hours of 12 and 1 PM. Please arrive to all physical therapy appointments approximately 15 min. early. Thank you Joe Wells Mabe PT

## 2016-06-08 ENCOUNTER — Encounter: Payer: Self-pay | Admitting: Primary Care

## 2016-06-08 ENCOUNTER — Ambulatory Visit: Payer: Medicaid Other | Attending: Primary Care | Admitting: Primary Care

## 2016-06-08 VITALS — BP 142/80 | HR 98 | Ht 66.0 in | Wt 235.0 lb

## 2016-06-08 DIAGNOSIS — G4733 Obstructive sleep apnea (adult) (pediatric): Secondary | ICD-10-CM

## 2016-06-08 DIAGNOSIS — F319 Bipolar disorder, unspecified: Secondary | ICD-10-CM

## 2016-06-08 DIAGNOSIS — M545 Low back pain, unspecified: Secondary | ICD-10-CM

## 2016-06-08 MED ORDER — AUTO-TITRATING CPAP MACHINE *A*
0 refills | Status: DC
Start: 2016-06-08 — End: 2016-06-08

## 2016-06-08 MED ORDER — GABAPENTIN 300 MG PO CAPSULE *I*
300.0000 mg | ORAL_CAPSULE | Freq: Three times a day (TID) | ORAL | 5 refills | Status: DC
Start: 2016-06-08 — End: 2016-08-10

## 2016-06-08 MED ORDER — AUTO-TITRATING CPAP MACHINE *A*
0 refills | Status: DC
Start: 2016-06-08 — End: 2019-08-13

## 2016-06-08 NOTE — Progress Notes (Signed)
Patient states that he has a titanium earring in his left ear that does not come out.  He has tried in the past to have this cut out without success.

## 2016-06-08 NOTE — Plan of Care (Signed)
Problem: Knowledge deficit related to pre or post-op regimens  Goal: Patient verbalizes understanding of PACU teaching  Outcome: Completed or Resolved Date Met:  06/08/16

## 2016-06-08 NOTE — Telephone (Signed)
Pre-Operative Instructions                            PRIOR TO SURGERY-8-3    Five days before surgery, please STOP taking if surgeon does not specify:     Anti-inflammatory medications (Ibuprofen, Motrin, Advil, Mobic, Meloxicam, Aleve, Naproxen, Voltaren, etc.)   Vitamins and herbal supplements, including herbal teas    YOU MAY TAKE ACETAMINOPHEN (TYLENOL) as needed   Directions regarding your prescribed blood thinners, including aspirin, MUST come from your surgeon or prescribing doctor.  Please contact them for instructions.    FOLLOW YOUR SURGEON'S INSTRUCTIONS IF DIFFERENT THAN ABOVE.    DAY BEFORE SURGERY-8-7     Call [phone] between 2PM and 4PM to receive your arrival/surgery time.  Call Friday afternoon if your surgery is on a Monday.   Please arrange for transportation to and from the hospital. You must have a responsible adult to stay with you after your surgery.   Do not eat anything (including candy or gum) after midnight the night before your surgery.    If you have been instructed, please perform your skin prep as directed.                                                                                         DAY OF SURGERY-8-8     You are encouraged to drink clear liquids up to 2 hours prior to your scheduled surgery time.  Examples include: Gatorade, clear apple or cranberry juice, water, soda , black coffee or tea.   If you are male, please be prepared to provide a urine sample on arrival to the preoperative area unless you are one year post menopause or have had a hysterectomy.    DO NOT WEAR: JEWELRY, MAKEUP, DARK NAIL POLISH, HAIR PINS, BODY LOTION OR SCENTS.     Please understand that rings and body piercings must be removed prior to surgery.   If they are not removed they are at risk of being cut off prior to surgery for safety reasons, and this may delay your surgery.     If wearing eyeglasses, please bring a case.  DO NOT WEAR CONTACT LENSES.   You may shower, brush  your teeth, and use deodorant.   If you have been instructed, please perform your skin prep as directed.    PLEASE BRING YOUR CPAP MACHINE INTO THE PREOPERATIVE AREA TO BE CHECKED.   Please bring any medical supplies or equipment requested by your surgeon (Example: Back/neck brace).    MEDICATIONS: DAY OF SURGERY     Take your medications with a sip of water as directed on the medication list. Hold Lisinopril DOS.   You may bring any prescribed inhalers and use as directed.   Anxiety medications may be taken at any time.      AT THE HOSPITAL ON THE DAY OF SURGERY     Park in the Main Ramp garage.  Enter the building through the [facility].  Please stop at the Information Desk so that they may direct you to [facility] [address].  Leave your belongings in the car (except for your CPAP) and your visitors Rockman bring them to your room after surgery.    [place]    If you would like to have your prescription filled prior to leaving the hospital, your family members are welcome to pick up your prescription for you.  If you are having a late surgery, please ask your doctor or nurse about writing the prescription prior to surgery so that it Loyd be obtained before the pharmacy closes if that is your preference.  The pharmacy is open until 5:30pm.     If you will be alone on the day of surgery and want to leave with your prescribed medication, you Wiemann:     Bring a check made out to [facility]   Use a credit card to pay for your prescription   Have your family call the pharmacy with a credit card number   Only bring cash to the hospital as a last resort. Prescriptions cannot be filled without payment. Thank you for your consideration.    QUESTIONS?     Question about these instructions? Call [phone], select option 2, and leave a message for a nurse to return your call.   Any questions regarding specifics about your surgery or recovery? Please call your surgeons  office.

## 2016-06-08 NOTE — Progress Notes (Signed)
~  [facility]~    SUBJECTIVE     Snoring  --Still  snoring loudly at night  --Continues to reports apneic episodes at night     Thyroid  --on levothyroxine;   --No change in hair or skin.  No thyroid tenderness.  No neck pain.      ETOH  --None     HTN  --no CP, no SOB  --of of antihypertensives    Back pain  --Pain present for several years.  Worse over the past several months.  Located in his lumbar region.  No change in strength in legs.  No fevers.  No bowel or bladder incontinence or retention.  --no sign radiation  --Also reports chronic knee pain    Pain Intensity:   Pain    06/08/16 1128   PainSc:   8   PainLoc: Back         Medications Reviewed    OBJECTIVE     Visit Vitals    BP 142/80    Pulse 98    Ht 1.676 m (5\' 6" )    Wt 106.6 kg (235 lb)    BMI 37.93 kg/m2     General Appearance: No distress  RRR, no m  CTA       ASSESSMENT / DIAGNOSIS     Recommend initiation of CPAP.  Stressed importance reviewed risks of untreated obstructive sleep apnea.   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  There may be a component of neuropathic etiology.  Recommend weight loss.  Will trial treatment with gabapentin, which may also have some benefit to his bipolar mood disorder.       ORDERS AND PLAN     Lumbar back pain  -     gabapentin (NEURONTIN) 300 MG capsule; Take 1 capsule (300 mg total) by mouth 3 times daily    OSA (obstructive sleep apnea)  - auto-titrating CPAP (AUTOSET) machine; Use as directed.  Pressure 5-15 mmH2O.  Include tubing and mask.   Dx -- E45.40G47.33  -     auto-titrating CPAP (AUTOSET) machine; Use as directed.  Pressure 5-15 mmH2O.  Include tubing and mask.   Dx -- J81.19G47.33    Bipolar 1 disorder  -     gabapentin (NEURONTIN) 300 MG capsule; Take 1 capsule (300 mg total) by mouth 3 times daily        --Patient instructed to call if symptoms are not improving or worsening  --Follow-up arranged    Signed: Madolyn FriezeOBERT J  Jamerion Cabello, MD on 06/09/2016 at 8:25 AM  [facility]

## 2016-06-09 ENCOUNTER — Other Ambulatory Visit: Payer: Self-pay | Admitting: Primary Care

## 2016-06-09 DIAGNOSIS — E039 Hypothyroidism, unspecified: Secondary | ICD-10-CM

## 2016-06-09 DIAGNOSIS — F319 Bipolar disorder, unspecified: Secondary | ICD-10-CM

## 2016-06-09 NOTE — Telephone Encounter (Signed)
Last Seen:  06/08/16  Last Rx:  9 years ago        Lab results: 05/11/16  1149   TSH 2.15

## 2016-06-14 ENCOUNTER — Ambulatory Visit
Admission: RE | Admit: 2016-06-14 | Discharge: 2016-06-14 | Disposition: A | Discharge status: Home / Self Care | Payer: Self-pay | Source: Ambulatory Visit | Attending: Primary Care | Admitting: Primary Care

## 2016-06-20 ENCOUNTER — Ambulatory Visit: Payer: Self-pay | Admitting: Rehabilitative and Restorative Service Providers"

## 2016-06-20 DIAGNOSIS — M542 Cervicalgia: Secondary | ICD-10-CM

## 2016-06-20 DIAGNOSIS — S39012A Strain of muscle, fascia and tendon of lower back, initial encounter: Secondary | ICD-10-CM

## 2016-06-20 NOTE — Progress Notes (Signed)
Physical Therapy Exercise Flowsheet:  *Please refer to Physical Therapy Daily Flowsheet for further details of this session.*     06/20/16 1500   Cervical Exercises   Turtle Exercise Comment 1 set, 10x, tol well   Upper Trapezius Stretch, Sitting Comment 1 set, 10x, tol well   Shoulder Exercises   Scapular Retraction, Standing Comment 1 set, 10x, tol well   Shoulder Circles Comment 1 set, 10x, tol well   Lumbar/Abdominal Exercises   Bridges w/ Pillow Squeeze Comment 1 set, 10x, tol well   Pelvic Rotation Comment 1 set, 10x, tol well   Pelvic Tilt w/ Marching, Supine Comment 1 set, 10x, tol well   Single Knee To Chest w/ Straight Leg Raise Comment 1 set, 10x, tol well       Lovett Calender PT, DPT

## 2016-06-20 NOTE — Progress Notes (Signed)
06/20/16 0700   Overview   Diagnosis back / neck pain   Insurance Blue Choice Option   Script Date 06/07/16   Missed Visit Canceled   Precautions CS fusion       Lovett Calender PT, DPT

## 2016-06-20 NOTE — Progress Notes (Signed)
Physical Therapy Daily Flowsheet:  *Please see Physical Therapy Exercise Flowsheet for details regarding exercises completed this session.*     06/20/16 1500   Overview   Diagnosis back / neck pain   Insurance Blue Choice Option   Script Date 06/07/16   Visit # 2   Precautions CS fusion   Additional Comments 06/28/2016 going for hernia surgery   Pain Assessment   0-10 Scale 7   Pain Location Back;Neck   Pain Orientation Lower   Modalities   Time CS / LS 10 minutes   Patient Education   Educated in disease process Yes   Educated in home exercise program Yes   Time Calculation   PT Timed Codes 25   PT Untimed Codes 10   PT Total Treatment 35   Charges   CC Charges Therapeutic Exercises - code 0204 (15 minutes x2);Hot/cold packs - code 2633       Lovett Calender PT, DPT

## 2016-06-22 ENCOUNTER — Ambulatory Visit: Payer: Self-pay | Admitting: Rehabilitative and Restorative Service Providers"

## 2016-06-22 DIAGNOSIS — M542 Cervicalgia: Secondary | ICD-10-CM

## 2016-06-22 DIAGNOSIS — S39012A Strain of muscle, fascia and tendon of lower back, initial encounter: Secondary | ICD-10-CM

## 2016-06-22 NOTE — Progress Notes (Signed)
Physical Therapy Daily Flowsheet:  *Please see Physical Therapy Exercise Flowsheet for details regarding exercises completed this session.*     06/22/16 0700   Overview   Diagnosis back / neck pain   Insurance Blue Choice Option   Script Date 06/07/16   Visit # 3   Precautions CS fusion   Additional Comments 06/28/2016 going for hernia surgery   Pain Assessment   0-10 Scale 7   Pain Location Back;Neck   Pain Orientation Lower   Modalities   Time CS / LS 10 minutes   Patient Education   Educated in disease process Yes   Educated in home exercise program Yes   Time Calculation   PT Timed Codes 25   PT Untimed Codes 10   PT Total Treatment 35   Charges   CC Charges Therapeutic Exercises - code 0204 (15 minutes x2);Hot/cold packs - code 1443     Lovett Calender PT, DPT

## 2016-06-22 NOTE — Progress Notes (Signed)
Physical Therapy Exercise Flowsheet:  *Please refer to Physical Therapy Daily Flowsheet for further details of this session.*     06/22/16 1400   Cervical Exercises   Turtle Exercise Comment 1 set, 10x, tol well   Upper Trapezius Stretch, Sitting Comment 1 set, 10x, tol well   Shoulder Exercises   Scapular Retraction, Standing Comment 1 set, 10x, tol well   Shoulder Circles Comment 1 set, 10x, tol well   Lumbar/Abdominal Exercises   Bridges w/ Pillow Squeeze Comment 1 set, 10x, tol well   Pelvic Rotation Comment 1 set, 10x, tol well   Pelvic Tilt w/ Marching, Supine Comment 1 set, 10x, tol well   Single Knee To Chest w/ Straight Leg Raise Comment 1 set, 10x, tol well       Lovett Calender PT, DPT

## 2016-06-27 ENCOUNTER — Telehealth: Payer: Self-pay | Admitting: Primary Care

## 2016-06-27 ENCOUNTER — Ambulatory Visit: Payer: Self-pay | Admitting: Rehabilitative and Restorative Service Providers"

## 2016-06-27 DIAGNOSIS — S39012A Strain of muscle, fascia and tendon of lower back, initial encounter: Secondary | ICD-10-CM

## 2016-06-27 DIAGNOSIS — M542 Cervicalgia: Secondary | ICD-10-CM

## 2016-06-27 NOTE — Telephone Encounter (Signed)
Pt is calling in he is stating that he has not heard anything regarding his CPAP machine yet  Pt would like to find out what to do next?    Please advise

## 2016-06-27 NOTE — Progress Notes (Signed)
Physical Therapy Exercise Flowsheet:  *Please refer to Physical Therapy Daily Flowsheet for further details of this session.*     06/27/16 1400   Cervical Exercises   Turtle Exercise Comment 1 set, 10x, tol well   Upper Trapezius Stretch, Sitting Comment 1 set, 10x, tol well   Shoulder Exercises   Scapular Retraction, Standing Comment 1 set, 10x, tol well   Shoulder Circles Comment 1 set, 10x, tol well   Lumbar/Abdominal Exercises   Bridges w/ Pillow Squeeze Comment 1 set, 10x, tol well   Pelvic Rotation Comment 1 set, 10x, tol well   Pelvic Tilt w/ Marching, Supine Comment 1 set, 10x, tol well   Single Knee To Chest w/ Straight Leg Raise Comment 1 set, 10x, tol well       Lovett CalenderJoe Jamyla Ard PT, DPT

## 2016-06-27 NOTE — Progress Notes (Signed)
Physical Therapy Daily Flowsheet:  *Please see Physical Therapy Exercise Flowsheet for details regarding exercises completed this session.*     06/27/16 0700   Overview   Diagnosis back / neck pain   Insurance Blue Choice Option   Script Date 06/07/16   Visit # 4   Precautions CS fusion   Additional Comments 06/28/2016 going for hernia surgery   Pain Assessment   0-10 Scale 7   Pain Location Back;Neck   Pain Orientation Lower   Modalities   Time CS / LS 10 minutes   Patient Education   Educated in disease process Yes   Educated in home exercise program Yes   Time Calculation   PT Timed Codes 25   PT Untimed Codes 10   PT Total Treatment 35   Charges   CC Charges Therapeutic Exercises - code 0204 (15 minutes x2);Hot/cold packs - code 16100119       Lovett CalenderJoe Rannie Craney PT, DPT

## 2016-06-27 NOTE — Patient Instructions (Signed)
Patient was instructed to perform their home exercise program two to three times per day to tolerance. Each exercise should be performed  10 times each or to tolerance. You should utilize moist heat at home for approximately 15 min. if you have pain. If you have any questions please feel free to contact me at [phone] between the hours of 12 and 1 PM. Thank you Joe Shaniece Bussa PT

## 2016-06-27 NOTE — Progress Notes (Signed)
Physical Therapy Progress Note    HISTORY:    Diagnosis: back / neck pain     Frequency: twice a week    Attendance: Consistent    Treatment: AROM/PROM/Therapeutic exercise, Cold pack, Flexibility, Heat, Home exercise program instructions/Patient education, Patient/Family Education, Postural training/body Curatormechanic education, Strengthening    Additional Interventions:  Follow up with MD prn    SUBJECTIVE:    Pain: Unchanged     0-10 Scale: 6,7     Pain Location: Neck / Back       OBJECTIVE:    ROM: ROM    Lumbar Spine: Impaired AROM Lumbar Spine  Right Lower Extremity: Full AROM RLE  Left Lower Extremity: Full AROM LLE  Cervical Spine: Impaired AROM Cervical Spine  Right Upper Extremity: Full AROM RUE  Left Upper Extremity: Full AROM LUE    Strength: STRENGTH    Right Lower Extremity: RLE Strength WFLs & able to perform ADL Tasks  Left Lower Extremity: LLE Strength WFLs & able to perform ADL tasks  Right Upper Extremity: RUE Strength WFLs & able to perform ADL Tasks  Left Upper Extremity: LUE Strength WFLs & able to perform ADL tasks      Function:      Improved     Functional Tasks:       A.  Improving with/ Able to do the following: Static sitting                           B.  Difficulties with /  Static standing, Walking, Squatting, turning head, reaching/Overhead ADLs              Special Test: Special Tests    Pearlean BrownieFaber: Negative  L3: Negative  Quadriceps: Negative  SLR: Negative  Thomas: Negative    Foraminal closure test negative     Patient Education    Educated in disease process: Yes  Educated in home exercise program: Yes     ASSESSMENT:    Persistent CS / LS pain. ? HNP vs muscular origin. Patient going for hernia surgery tomorrow.    Progress toward previous goals: fair   '  Patients compliance with therapy and home exercise program:  good      PLAN:    Short Term Goals:  Achieved short term goals    Long Term Goals:  Partially achieved LTGs    Plan of Care:  Discharge PT, all goals partially achieved and  patient independent with a home exercise program    Thank you for the referral.  If you have any questions and/or concerns, please feel free to contact me at [phone]. Reviewed patients home exercise program. Patient to follow up with MD regarding condition. If patients pain is persistent may need additional interventions.    Lovett CalenderJoe Markesia Crilly PT, DPT

## 2016-06-27 NOTE — Telephone Encounter (Signed)
Please call [employer] and help facilitate.   thanks

## 2016-06-28 ENCOUNTER — Ambulatory Visit
Admission: RE | Admit: 2016-06-28 | Discharge: 2016-06-28 | Disposition: A | Discharge status: Home / Self Care | Payer: Medicaid Other | Source: Ambulatory Visit | Attending: Surgery | Admitting: Surgery

## 2016-06-28 ENCOUNTER — Encounter: Payer: Self-pay | Admitting: Anesthesiology

## 2016-06-28 ENCOUNTER — Encounter
Admission: RE | Disposition: A | Discharge status: Home / Self Care | Payer: Self-pay | Source: Ambulatory Visit | Attending: Surgery

## 2016-06-28 ENCOUNTER — Ambulatory Visit: Payer: Medicaid Other

## 2016-06-28 ENCOUNTER — Ambulatory Visit: Payer: Medicaid Other | Admitting: Anesthesiology

## 2016-06-28 DIAGNOSIS — K429 Umbilical hernia without obstruction or gangrene: Secondary | ICD-10-CM | POA: Insufficient documentation

## 2016-06-28 SURGERY — REPAIR, HERNIA, UMBILICAL
Anesthesia: General | Site: Abdomen | Wound class: Clean

## 2016-06-28 MED ORDER — ALBUTEROL SULFATE HFA 108 (90 BASE) MCG/ACT IN AERS *I*
INHALATION_SPRAY | RESPIRATORY_TRACT | Status: AC
Start: 2016-06-28 — End: 2016-06-28
  Filled 2016-06-28: qty 8

## 2016-06-28 MED ORDER — LIDOCAINE HCL 2 % (PF) IJ SOLN *I*
INTRAMUSCULAR | Status: AC
Start: 2016-06-28 — End: 2016-06-28
  Filled 2016-06-28: qty 5

## 2016-06-28 MED ORDER — LACTATED RINGERS IV SOLN *I*
100.0000 mL/h | INTRAVENOUS | Status: DC
Start: 2016-06-28 — End: 2016-06-29

## 2016-06-28 MED ORDER — HYDROCODONE-ACETAMINOPHEN 5-325 MG PO TABS *I*
1.0000 | ORAL_TABLET | Freq: Four times a day (QID) | ORAL | 0 refills | Status: AC | PRN
Start: 2016-06-28 — End: 2016-07-03

## 2016-06-28 MED ORDER — ALBUTEROL SULFATE HFA 108 (90 BASE) MCG/ACT IN AERS *I*
INHALATION_SPRAY | RESPIRATORY_TRACT | Status: DC | PRN
Start: 2016-06-28 — End: 2016-06-28
  Administered 2016-06-28: 10 via RESPIRATORY_TRACT

## 2016-06-28 MED ORDER — MEPERIDINE HCL 25 MG/ML IJ SOLN *I*
12.5000 mg | INTRAMUSCULAR | Status: DC | PRN
Start: 2016-06-28 — End: 2016-06-28

## 2016-06-28 MED ORDER — ALBUTEROL SULFATE (2.5 MG/3ML) 0.083% IN NEBU *I*
2.5000 mg | INHALATION_SOLUTION | Freq: Once | RESPIRATORY_TRACT | Status: AC
Start: 2016-06-28 — End: 2016-06-28

## 2016-06-28 MED ORDER — HYDROCODONE-ACETAMINOPHEN 5-325 MG PO TABS *I*
2.0000 | ORAL_TABLET | Freq: Four times a day (QID) | ORAL | Status: DC | PRN
Start: 2016-06-28 — End: 2016-06-29

## 2016-06-28 MED ORDER — ALBUTEROL SULFATE (2.5 MG/3ML) 0.083% IN NEBU *I*
INHALATION_SOLUTION | RESPIRATORY_TRACT | Status: AC
Start: 2016-06-28 — End: 2016-06-28
  Administered 2016-06-28: 2.5 mg via RESPIRATORY_TRACT
  Filled 2016-06-28: qty 3

## 2016-06-28 MED ORDER — MIDAZOLAM HCL 5 MG/5ML IJ SOLN *I*
INTRAMUSCULAR | Status: AC
Start: 2016-06-28 — End: 2016-06-28
  Filled 2016-06-28: qty 5

## 2016-06-28 MED ORDER — ONDANSETRON HCL 2 MG/ML IV SOLN *I*
INTRAMUSCULAR | Status: DC | PRN
Start: 2016-06-28 — End: 2016-06-28
  Administered 2016-06-28: 4 mg via INTRAMUSCULAR

## 2016-06-28 MED ORDER — HALOPERIDOL LACTATE 5 MG/ML IJ SOLN *I*
0.5000 mg | Freq: Once | INTRAMUSCULAR | Status: DC | PRN
Start: 2016-06-28 — End: 2016-06-28

## 2016-06-28 MED ORDER — FENTANYL CITRATE 50 MCG/ML IJ SOLN *WRAPPED*
INTRAMUSCULAR | Status: DC | PRN
Start: 2016-06-28 — End: 2016-06-28
  Administered 2016-06-28: 100 ug via INTRAVENOUS
  Administered 2016-06-28: 150 ug via INTRAVENOUS

## 2016-06-28 MED ORDER — MIDAZOLAM HCL 1 MG/ML IJ SOLN *I* WRAPPED
INTRAMUSCULAR | Status: DC | PRN
Start: 2016-06-28 — End: 2016-06-28
  Administered 2016-06-28: 3 mg via INTRAVENOUS
  Administered 2016-06-28: 2 mg via INTRAVENOUS

## 2016-06-28 MED ORDER — CEFAZOLIN 2000 MG IN 100 ML D5W *I*
2000.0000 mg | INTRAVENOUS | Status: AC
Start: 2016-06-28 — End: 2016-06-28
  Administered 2016-06-28: 2000 mg via INTRAVENOUS
  Filled 2016-06-28: qty 100

## 2016-06-28 MED ORDER — LIDOCAINE HCL 2 % IJ SOLN *I*
INTRAMUSCULAR | Status: DC | PRN
Start: 2016-06-28 — End: 2016-06-28
  Administered 2016-06-28: 100 mg via INTRAVENOUS

## 2016-06-28 MED ORDER — LIDOCAINE HCL 1 % IJ SOLN *I*
0.1000 mL | INTRAMUSCULAR | Status: DC | PRN
Start: 2016-06-28 — End: 2016-06-28
  Filled 2016-06-28: qty 2

## 2016-06-28 MED ORDER — HEPARIN SODIUM 5000 UNIT/ML SQ *I*
5000.0000 [IU] | Freq: Once | SUBCUTANEOUS | Status: AC
Start: 2016-06-28 — End: 2016-06-28
  Administered 2016-06-28: 5000 [IU] via SUBCUTANEOUS
  Filled 2016-06-28: qty 1

## 2016-06-28 MED ORDER — ROCURONIUM BROMIDE 10 MG/ML IV SOLN *WRAPPED*
Status: DC | PRN
Start: 2016-06-28 — End: 2016-06-28
  Administered 2016-06-28: 2 mg via INTRAVENOUS
  Administered 2016-06-28: 28 mg via INTRAVENOUS

## 2016-06-28 MED ORDER — NEOSTIGMINE METHYLSULFATE 10 MG/10ML IV SOLN *I*
INTRAVENOUS | Status: AC
Start: 2016-06-28 — End: 2016-06-28
  Filled 2016-06-28: qty 3

## 2016-06-28 MED ORDER — DEXAMETHASONE SODIUM PHOSPHATE 4 MG/ML INJ SOLN *WRAPPED*
INTRAMUSCULAR | Status: DC | PRN
Start: 2016-06-28 — End: 2016-06-28
  Administered 2016-06-28: 4 mg via INTRAVENOUS

## 2016-06-28 MED ORDER — PROPOFOL 10 MG/ML IV EMUL (INTERMITTENT DOSING) WRAPPED *I*
INTRAVENOUS | Status: AC
Start: 2016-06-28 — End: 2016-06-28
  Filled 2016-06-28: qty 20

## 2016-06-28 MED ORDER — GLYCOPYRROLATE 0.2 MG/ML IJ SOLN *I*
INTRAMUSCULAR | Status: DC | PRN
Start: 2016-06-28 — End: 2016-06-28
  Administered 2016-06-28: 0.4 mg via INTRAVENOUS

## 2016-06-28 MED ORDER — NEOSTIGMINE METHYLSULFATE 10 MG/10ML IV SOLN *I*
INTRAVENOUS | Status: DC | PRN
Start: 2016-06-28 — End: 2016-06-28
  Administered 2016-06-28: 3 mg via INTRAVENOUS

## 2016-06-28 MED ORDER — PROPOFOL 10 MG/ML IV EMUL (INTERMITTENT DOSING) WRAPPED *I*
INTRAVENOUS | Status: DC | PRN
Start: 2016-06-28 — End: 2016-06-28
  Administered 2016-06-28: 200 mg via INTRAVENOUS

## 2016-06-28 MED ORDER — PROMETHAZINE HCL 25 MG/ML IJ SOLN *I*
6.2500 mg | Freq: Once | INTRAMUSCULAR | Status: DC | PRN
Start: 2016-06-28 — End: 2016-06-28

## 2016-06-28 MED ORDER — ROCURONIUM BROMIDE 10 MG/ML IV SOLN *WRAPPED*
Status: AC
Start: 2016-06-28 — End: 2016-06-28
  Filled 2016-06-28: qty 5

## 2016-06-28 MED ORDER — LACTATED RINGERS IV SOLN *I*
125.0000 mL/h | INTRAVENOUS | Status: DC
Start: 2016-06-28 — End: 2016-06-28

## 2016-06-28 MED ORDER — GLYCOPYRROLATE 0.2 MG/ML IJ SOLN *WRAPPED*
INTRAMUSCULAR | Status: AC
Start: 2016-06-28 — End: 2016-06-28
  Filled 2016-06-28: qty 2

## 2016-06-28 MED ORDER — HYDROCODONE-ACETAMINOPHEN 5-325 MG PO TABS *I*
1.0000 | ORAL_TABLET | Freq: Four times a day (QID) | ORAL | Status: DC | PRN
Start: 2016-06-28 — End: 2016-06-29

## 2016-06-28 MED ORDER — FENTANYL CITRATE 50 MCG/ML IJ SOLN *WRAPPED*
INTRAMUSCULAR | Status: AC
Start: 2016-06-28 — End: 2016-06-28
  Filled 2016-06-28: qty 5

## 2016-06-28 MED ORDER — LACTATED RINGERS IV SOLN *I*
20.0000 mL/h | INTRAVENOUS | Status: DC
Start: 2016-06-28 — End: 2016-06-28
  Administered 2016-06-28: 20 mL/h via INTRAVENOUS

## 2016-06-28 MED ORDER — BUPIVACAINE-EPINEPHRINE 0.25 % IJ SOLUTION *WRAPPED*
INTRAMUSCULAR | Status: AC
Start: 2016-06-28 — End: 2016-06-28
  Filled 2016-06-28: qty 30

## 2016-06-28 MED ORDER — SODIUM CHLORIDE 0.9 % IV SOLN WRAPPED *I*
20.0000 mL/h | Status: DC
Start: 2016-06-28 — End: 2016-06-28

## 2016-06-28 MED ORDER — SUCCINYLCHOLINE CHLORIDE 20 MG/ML IV/IJ SOLN *WRAPPED*
Status: DC | PRN
Start: 2016-06-28 — End: 2016-06-28
  Administered 2016-06-28: 150 mg via INTRAVENOUS

## 2016-06-28 MED ORDER — SUCCINYLCHOLINE CHLORIDE 20 MG/ML IV/IJ SOLN *WRAPPED*
Status: AC
Start: 2016-06-28 — End: 2016-06-28
  Filled 2016-06-28: qty 10

## 2016-06-28 MED ORDER — ONDANSETRON HCL 2 MG/ML IV SOLN *I*
INTRAMUSCULAR | Status: AC
Start: 2016-06-28 — End: 2016-06-28
  Filled 2016-06-28: qty 2

## 2016-06-28 MED ORDER — HYDROMORPHONE HCL PF 1 MG/ML IJ SOLN *WRAPPED*
0.4000 mg | INTRAMUSCULAR | Status: DC | PRN
Start: 2016-06-28 — End: 2016-06-28

## 2016-06-28 MED ORDER — BUPIVACAINE-EPINEPHRINE 0.25 % IJ SOLUTION *WRAPPED*
INTRAMUSCULAR | Status: DC | PRN
Start: 2016-06-28 — End: 2016-06-28
  Administered 2016-06-28: 30 mL via SUBCUTANEOUS

## 2016-06-28 SURGICAL SUPPLY — 35 items
ADHESIVE DERMABOND GLUE HI VISCOSITY (Dressing)
ADHESIVE SKIN CLOSURE 0.7ML DERMABOND ADVANCED (Dressing) ×2 IMPLANT
APPLICATOR CHLORAPREP 26ML ORANGE LARGE (Solution) ×3 IMPLANT
BINDER ABD 9IN 3 PANEL 30IN- 45IN (Dressing) IMPLANT
BINDER ABD 9IN 3 PANEL 46IN- 62IN (Dressing) ×2 IMPLANT
BLADE CLIPPER SURG (Supply) ×1
BLADE SUR CLIPPER W37.2MM CUT H0.23MM GEN PURP EXISTING CLP HNDL DISP (Supply) ×1 IMPLANT
BLADE SUR W37.2MM CUT H0.23MM GEN PURP EXISTING CLP HNDL DISP (Supply) ×1 IMPLANT
BLANKET LOWER BODY TEMP THERAPY (Drape) IMPLANT
BLANKET UPPER BODY TEMP THERAPY (Drape) ×2 IMPLANT
DRAIN PENROSE 18 X .25IN STER LTX (Supply) ×1 IMPLANT
GLOVE SURG DERMAPRENE ULTRA SZ7.5 PF LF (Glove) ×3 IMPLANT
GLOVE SURG PROTEXIS PI 7.5 PF SYN (Glove) ×3 IMPLANT
NEEDLE HYPO BVL LF 25G X 1.5IN (Needle) ×3 IMPLANT
PACK CUSTOM GENERAL PACK (Pack) ×3 IMPLANT
PATCH HERNIAL VENTRALEX ST SM 1.7X1.7IN (Implant) ×2 IMPLANT
SLEEVE COMP KNEE HI MED (Supply) ×3 IMPLANT
SOL H2O IRRIG STER 1000ML BTL (Solution) ×1
SOL SOD CHL IRRIG 1000ML BTL (Solution)
SOL SODIUM CHLORIDE IRRIG 1000ML BTL (Solution) IMPLANT
SOL WATER IRRIG STERILE 1000ML BTL (Solution) ×2 IMPLANT
SPONGE CURITY GAUZE 8PLY 2X2IN STER (Dressing) ×3 IMPLANT
SPONGE K DISSECTOR (Sponge)
SPONGE SUR W0.25XL9/16IN WHT COT RND KTNR DISECT RADPQ DISP (Sponge) IMPLANT
SUTR MONOCRYL PLUS 4-0 18PS2 (Suture) ×3 IMPLANT
SUTR VICRYL ANITIB 3-0 SH 27 VIOLET (Suture) IMPLANT
SUTR VICRYL ANTIB 0 UR-6 27 VIOLET (Suture) ×5 IMPLANT
SUTR VICRYL ANTIB 2-0 SH 27 UNDY (Suture) IMPLANT
SUTR VICRYL ANTIB 2-0 SH 27 VIOLET (Suture) IMPLANT
SUTR VICRYL ANTIB 3-0 SH 18 VIOLET (Suture) ×3 IMPLANT
SUTR VICRYL ANTIB 3-0 SH 27 UNDY (Suture) IMPLANT
SUTR VICRYL CTD 1 CT-1 VIOLET (Suture) IMPLANT
SUTURE ETHBND EXCEL SZ 2 L27IN NONABSORBABLE GRN WHT MO-7 L22MM 1/2 CIR TAPERPOINT NDL (Suture) IMPLANT
SYRINGE LUERLOCK CNTL 10CC (Supply) IMPLANT
TAPE UMBILICAL COTTON 1/8 X 30IN STER (Supply) ×2 IMPLANT

## 2016-06-28 NOTE — Discharge Instructions (Signed)
Division II Surgery Discharge Instructions    Date: 06/28/2016  Procedure: Umbilical hernia repair  Physician: Roe Rutherford, MD    You have received sedative medication and/or general anesthesia which may make you drowsy for as long as 24 hours:  A) DO NOT drive or operate any machinery for 24 hours  B) DO NOT drink alcoholic beverages for 24 hours  C) DO NOT make major decisions, sign contracts, etc. for 24 hours    DIET - Resume your previous diet    PAIN MANAGEMENT - Per medication list below. Norco/Percocet contains tylenol (acetaminophen). Please do not take tylenol or tylenol containing products while on these medications to avoid tylenol overdose. Many over the counter medications contain acetaminophen. Do not exceed 4000 mg of tylenol (acetaminophen) per day.     CARE OF THE INCISION - After the surgery, the incisions will be covered with a Topical Skin Adhesive. The film will usually remain in place for 5 - 10 days then naturally fall off your skin.  Do not scratch, rub, or pick at the adhesive skin film.  This may loosen the film before your wound is healed.  If you are required to place a dressing over the incision, do not place tape over the skin adhesive since this may remove the skin adhesive. The stitches are underneath the skin and will dissolve.  You may shower tonight. Do not scrub the incision.  Just let the water run over it and then gently pat it dry. Do not submerge the incisions for 1 week (no baths, hot tubs, or swimming pools) or as directed by your physician.      COMFORT MEASURES - For the first few days, it is common for the area around the incision to be swollen, discolored (black & blue), and sore.  To help reduce swelling, apply an ice pack or bag of frozen peas to the swollen area for 15 to 20 minutes every hour for 3 days or more as needed.  Wear loose, comfortable clothing.  We recommend the use of over-the-counter anti-inflammatory medication such as ibuprofen (Advil, Motrin,  Aleve) to minimize swelling, inflammation, and pain.  Take medication every 4 - 6 hours with food.  If over-the-counter medications are ineffective, a narcotic pain medication prescribed by your doctor may be used in addition.  Be aware that some prescription pain medication can cause constipation, nausea, or vomiting.    ACTIVITY- Lifting should be restricted to 10 pounds or less for 2 weeks and 20 pounds or less for an additional 4 weeks following your surgery or as instructed by the physician.  Avoid pushing, pulling, straining, or any strenuous activity.   You may climb stairs.  You may drive if not using narcotic pain medication and if you are comfortable enough behind the wheel and can safely operate the vehicle.    DRIVING - You may drive when you are not using any prescription pain medication and when you are able to react normally.    RETURN TO WORK - You may return to work when you feel you are ready (usually 1 - 2 weeks.)  If your job involves heavy lifting, straining, or manual labor you will be out for 6 weeks.  If your job involves the above, you may be able to return to work sooner with restrictions as indicated by your doctor.      DRIVING - You may drive when you are not using any prescription pain medication and when you are able  to react normally.    FOLLOW-UP OFFICE VISIT - You will be seen in the office 2-3 weeks following your surgery.  Please follow the instructions below to schedule a follow up appointment.    WHEN TO CALL THE OFFICE - Do not hesitate to call the office if you develop a fever (temperature greater than 101), shaking chills, a large amount of swelling or bruising (some scrotal swelling and bruising is common with inguinal hernia repairs), bleeding, increasing redness or drainage from your incision, trouble urinating, persistent or increased pain, nausea/vomiting, or with any other problem that concerns you.    Call Dr. Roe RutherfordJoseph A Johnson, MD  at [phone] with  questions/concerns.     Author: Link SnufferMatthew Eilam Shrewsbury, MD  as of: 06/28/2016  at: 5:26 PM

## 2016-06-28 NOTE — Anesthesia Postprocedure Evaluation (Signed)
Anesthesia Post-Op Note    Patient: Timothy French    Procedure(s) Performed:  Procedure Summary     Date Anesthesia Start Anesthesia Stop Room / Location    06/28/16 1607 1726 [id number] / [facility] MAIN OR       Procedure Diagnosis Surgeon Attending Anesthesia    UMBILICAL HERNIA REPAIR (N/A Abdomen) Umbilical hernia  (umbilical hernia) Roe RutherfordJohnson, Joseph A, MD Stann MainlandHanau, Mariaeduarda Defranco, MD        Recovery Vitals  BP: 119/63 (06/28/2016  6:30 PM)  Heart Rate: 86 (06/28/2016  6:30 PM)  Heart Rate (via Pulse Ox): 86 (06/28/2016  6:30 PM)  Resp: 14 (06/28/2016  6:30 PM)  Temp: 36.5 C (97.7 F) (06/28/2016  6:15 PM)  SpO2: 93 % (06/28/2016  6:35 PM)  O2 Device: None (Room air) (06/28/2016  6:35 PM)  O2 Flow Rate: 2 L/min (06/28/2016  6:02 PM)   0-10 Scale: 0 (06/28/2016  6:30 PM)  Anesthesia type:  General  Complications Noted During Procedure or in PACU:  None   Comment:    Patient Location:  PACU  Level of Consciousness:    Recovered to baseline  Patient Participation:     Able to participate  Temperature Status:    Normothermic  Oxygen Saturation:    Within patient's normal range  Cardiac Status:   Within patient's normal range  Fluid Status:    Stable  Airway Patency:     Yes  Pulmonary Status:    Baseline  Pain Management:    Adequate analgesia  Nausea and Vomiting:  None    Post Op Assessment:    Tolerated procedure well   Attending Attestation:  All indicated post anesthesia care provided     -

## 2016-06-28 NOTE — Anesthesia Procedure Notes (Signed)
---------------------------------------------------------------------------------------------------------------------------------------    AIRWAY   GENERAL INFORMATION AND STAFF    Patient location during procedure: OR       Date of Procedure: 06/28/2016 4:35 PM  CONDITION PRIOR TO MANIPULATION     Current Airway/Neck Condition:  Normal        For more airway physical exam details, see Anesthesia PreOp Evaluation  AIRWAY METHOD     Patient Position:  Sniffing    Preoxygenated: yes      Induction: IV  Mask Difficulty Assessment:  0 - not attempted      Technique Used for Successful ETT Placement:  Video laryngoscopy    Devices/Methods Used in Placement:  Intubating stylet    Blade Type:  Macintosh    Laryngoscope Blade/Video laryngoscope Blade Size:  4    Cormack-Lehane Classification:  Grade I - full view of glottis    Placement Verified by: capnometry and auscultation      Number of Attempts at Approach:  1    Number of Other Approaches Attempted:  0  FINAL AIRWAY DETAILS    Final Airway Type:  Endotracheal airway    Adjunct Airway: oropharyngeal airway (OPA)    OPA Size:  100mm    Final Endotracheal Airway:  ETT      Cuffed: cuffed    Insertion Site:  Oral    ETT Size (mm):  8.0    Distance inserted from Lips (cm):  22  ----------------------------------------------------------------------------------------------------------------------------------------

## 2016-06-28 NOTE — INTERIM OP NOTE (Signed)
Interim Op Note (Surgical Log ID: [id number])       Date of Surgery: 06/28/2016       Surgeons: Moishe SpiceSurgeon(s) and Role:     * Roe RutherfordJohnson, Joseph A, MD - Primary     * Link SnufferSeaman, Quana Chamberlain, MD - Resident - Assisting       Pre-op Diagnosis: Pre-Op Diagnosis Codes:     * Umbilical hernia [K42.9]       Post-op Diagnosis: Post-Op Diagnosis Codes:     * Umbilical hernia [K42.9]       Procedure(s) Performed: Procedures:    * UMBILICAL HERNIA REPAIR       Additional CPT Codes:        Anesthesia Type: Anesthesia type not filed in the log.        Fluid Totals: I/O this shift:  08/08 1500 - 08/08 2259  In: 800 (7.3 mL/kg) [I.V.:800]  Out: 5 (0 mL/kg) [Blood:5]  Net: 795  Weight: 109.8 kg        Estimated Blood Loss: Blood Loss: 5 mL       Specimens to Pathology:  * No specimens in log *       Temporary Implants:        Packing:                 Patient Condition: good       Findings (Including unexpected complications): No unexpected findings     Signed:  Link SnufferMatthew Markos Theil, MD  on 06/28/2016 at 5:25 PM

## 2016-06-28 NOTE — H&P (View-Only) (Signed)
Anesthesia Pre-operative History and Physical for Timothy French [place] French    ______________________________________________________________________________________    Summary:  Timothy French presents preoperatively for anesthesia evaluation prior to umbilical hernia repair, no personal or FHX of anesthesia problems, has answered no to all peri op ekg screening questions. He  has a past medical history of Asthma; Bipolar affective; OSA on CPAP, tobacco abuse,  Depression; Low Back Pain, Neck pain, Restless Legs syndrome,  and Hypertension.    By Rosary LivelyELIZABETH PICZKO, PA at 1:16 PM on 06/28/2016    <[facility]URGSITE>  Anesthesia Evaluation Information Source: patient     ANESTHESIA  Pertinent(-):  history of anesthetic complications, Family Hx of Anesthetic Complications    GENERAL    + Obesity  Pertinent (-):  substance abuse, history of anesthetic complications, Family Hx of Anesthetic Complications    HEENT    + Neck Pain (s/p cervical fusion)            limits extension PULMONARY    + Smoker    + Asthma (uses rescue chronically three times a day, PCP aware, no other meds for asthma, no pulmonologist, no PFT)            inhaler daily    + Shortness of breath (chronic, uses inhaler)    + Sleep apnea          CPAP  Pertinent(-): recent URI, cough/congestion    CARDIOVASCULAR  Poor<4 METS Exercise Tolerance    + Hypertension  Pertinent(-):  orthopnea, PND, DOE    Comment: Limited activity due to chronic neck and back pain    GI/HEPATIC/RENAL  Last PO Intake: >8hr before procedure and >2hr before procedure (clears)    + GERD  Pertinent(-):  nausea, vomiting, bowel issues, urinary issues NEURO/PSYCH    + Chronic pain (and neck)          lower back    + Psychiatric Issues (stable on medication and sees psychiatrist)          bipolar, depression, anxiety    + Neuromuscular disease (restless legs)  Pertinent(-):  dizziness/motion sickness, syncope, seizures    ENDO/OTHER    + Thyroid Disease            HYPOthyroid      HEMALOGIC  Pertinent(-):  bruises/bleeds easily       Physical Exam    Airway            Mouth opening: normal            Neck ROM: full            Facial hair: beard, mustache  Dental    Comment: Missing teeth   Cardiovascular           Rhythm: regular           Rate: normal  No carotid bruit      General Survey    No rashes   Pulmonary     breath sounds clear to auscultation    No rhonchi, decreased breath sounds, wheezes, rales    Mental Status     oriented to person, place and time       ________________________________________________________________________  Ermalinda MemosAnes Plan  Anesthesia Consent Not Performed

## 2016-06-28 NOTE — Anesthesia Preprocedure Evaluation (Addendum)
Anesthesia Pre-operative History and Physical for Timothy French    ______________________________________________________________________________________    Summary:  Timothy French presents preoperatively for anesthesia evaluation prior to umbilical hernia repair, no personal or FHX of anesthesia problems, has answered no to all peri op ekg screening questions. He  has a past medical history of Asthma; Bipolar affective; OSA on CPAP, tobacco abuse,  Depression; Low Back Pain, Neck pain, Restless Legs syndrome,  and Hypertension.    By Rosary LivelyELIZABETH PICZKO, PA at 1:16 PM on 06/28/2016    <[facility]UR[facility]ITE>  Anesthesia Evaluation Information Source: patient, records     ANESTHESIA  Pertinent(-):  history of anesthetic complications, Family Hx of Anesthetic Complications    GENERAL    + Obesity  Pertinent (-):  substance abuse, history of anesthetic complications, Family Hx of Anesthetic Complications    HEENT    + Neck Pain (s/p cervical fusion)            limits extension PULMONARY    + Smoker    + Asthma (uses rescue chronically three times a day, PCP aware, no other meds for asthma, no pulmonologist, no PFT)            inhaler daily    + Shortness of breath (chronic, uses inhaler)    + Sleep apnea          CPAP  Pertinent(-): recent URI, cough/congestion    CARDIOVASCULAR  Poor<4 METS Exercise Tolerance    + Hypertension  Pertinent(-):  orthopnea, PND, DOE    Comment: Limited activity due to chronic neck and back pain    GI/HEPATIC/RENAL  Last PO Intake: >8hr before procedure and >2hr before procedure (clears)    + GERD  Pertinent(-):  nausea, vomiting, bowel issues, urinary issues NEURO/PSYCH    + Chronic pain (and neck)          lower back    + Psychiatric Issues (stable on medication and sees psychiatrist)          bipolar, depression, anxiety    + Neuromuscular disease (restless legs)  Pertinent(-):  dizziness/motion sickness, syncope, seizures    ENDO/OTHER    + Thyroid Disease            HYPOthyroid      HEMALOGIC  Pertinent(-):  bruises/bleeds easily       Physical Exam    Airway            Mouth opening: normal            Mallampati: II            Neck ROM: full            Facial hair: beard, mustache            Airway Impression: easy  Dental        Comment: Missing teeth   Cardiovascular           Rhythm: regular           Rate: normal  No carotid bruit    Neurologic    Normal Exam    General Survey    Normal Exam  No rashes   Pulmonary     breath sounds clear to auscultation    No rhonchi, decreased breath sounds, wheezes, rales    Mental Status     oriented to person, place and time       ________________________________________________________________________  Plan  ASA Score  3  Anesthetic Plan general  Induction (modified RSI); General Anesthesia/Sedation Maintenance Plan (inhaled agents and neuromuscular blockade);  Airway Manipulation (video laryngoscope); Airway (cuffed ETT); Line ( use current access); Monitoring (standard ASA); Positioning (supine); PONV Plan (dexamethasone and ondansetron); Pain (per surgical team); PostOp (PACU)    Informed Consent     Risks:          Risks discussed were commensurate with the plan listed above with the following specific points: N/V, aspiration and sore throat , damage to:(eyes, nerves, teeth), awareness, unexpected serious injury, allergic Rx, death    Anesthetic Consent:      Anesthetic plan (and risks as noted above) were discussed with patient and spouse    Plan also discussed with team members including:  surgeon    Attending Attestation:  As the primary attending anesthesiologist, I attest that the patient or proxy understands and accepts the risks and benefits of the anesthesia plan. I also attest that I have personally performed a pre-anesthetic examination and evaluation, and prescribed the anesthetic plan for this particular location within 48 hours prior to the anesthetic as documented. Stann Mainland, MD 6:51 PM

## 2016-06-28 NOTE — Interval H&P Note (Signed)
UPDATES TO PATIENT'S CONDITION on the DAY OF SURGERY/PROCEDURE    I. Updates to Patient's Condition (to be completed by a provider privileged to complete a H&P, following reassessment of the patient by the provider):    Day of Surgery/Procedure Update:  History  History reviewed and no change    Physical  Physical exam updated and no change            II. Procedure Readiness   I have reviewed the patient's H&P and updated condition. By completing and signing this form, I attest that this patient is ready for surgery/procedure.    III. Attestation   I have reviewed the updated information regarding the patient's condition and it is appropriate to proceed with the planned surgery/procedure.    Roe RutherfordJOSEPH A Carreen Milius, MD as of 2:07 PM 06/28/2016

## 2016-06-28 NOTE — Progress Notes (Addendum)
1730    Pt care rec'd in PACU via stretcher; report per Dr Tressia DanasHanau; pt sounding congested; attempting to clear secretions; Albuterol nebulizer administered per one time order    1839   Pt transferred to SDSU via stretcher; report to [employer] RN; pt encouraged to follow up on CPAP that has been ordered but not delivered yet, per pt

## 2016-06-28 NOTE — Anesthesia Case Conclusion (Signed)
CASE CONCLUSION  [facility]  Actions:  Suctioned and extubated  Criteria Used for Airway Removal:  Adequate Tv & RR, acceptable O2 saturation and following commands  Assessment:  Routine  Transport  Directly to: PACU  Airway:  Facemask  Oxygen Delivery:  10 lpm  Position:  Recumbent  Patient Condition on Handoff  Level of Consciousness:  Moderately sedated  Patient Condition:  Stable  Handoff Report to:  RN

## 2016-06-29 ENCOUNTER — Telehealth: Payer: Self-pay | Admitting: Rehabilitative and Restorative Service Providers"

## 2016-06-29 NOTE — Telephone Encounter (Signed)
Patient would like to touch base with Joe. Joe told patient to call him once he was out of surgery

## 2016-06-30 ENCOUNTER — Telehealth: Payer: Self-pay | Admitting: Primary Care

## 2016-06-30 ENCOUNTER — Telehealth: Payer: Self-pay | Admitting: Surgery

## 2016-06-30 ENCOUNTER — Encounter: Payer: Self-pay | Admitting: Gastroenterology

## 2016-06-30 DIAGNOSIS — J45909 Unspecified asthma, uncomplicated: Secondary | ICD-10-CM

## 2016-06-30 NOTE — Telephone Encounter (Signed)
Thank you :)

## 2016-06-30 NOTE — Telephone Encounter (Signed)
Pt is requesting a large binder will pick it up tomorrow

## 2016-06-30 NOTE — Telephone Encounter (Signed)
JJ HERNIA 06/28/16    Patient called stating the brace they gave him is too small. Its a size medium and  It hurts him to wear. Patient wants a call back to see if he can get another one and if not   How long does he have to wear it and if able to take off thru out the day. Please follow up at [phone]

## 2016-06-30 NOTE — Op Note (Signed)
PATIENT:   Timothy French, Timothy French MR #:  [id number]   ACCOUNT #:  [id number] DOB:  11/02/1966    AGE:  49     SURGEON:  Roe RutherfordJoseph A Anarosa Kubisiak, MD  CO-SURGEON:    ASSISTANT:    SURGERY DATE:  06/28/2016    PREOPERATIVE DIAGNOSIS:  Umbilical hernia.    POSTOPERATIVE DIAGNOSIS:  Umbilical hernia.    OPERATIVE PROCEDURE:  Umbilical hernia repair with mesh.    ANESTHESIA:  General.    DESCRIPTION OF PROCEDURE:  After satisfactory induction of general anesthesia, the patient was prepped and draped in the standard fashion with ChloraPrep.       An umbilical incision was made and the umbilical stalk encircled the Penrose drain and was carefully dissected free of the hernia.  The hernia was dissected free and flaps were raised.  The properitoneal space was dissected free and some redundant fascia was excised.  A 1.7 inch Ventralex patch was placed into the space and spread out.  A U-stitch of 0 Vicryl was used to connect the fascia to tabs.  An additional stitch connecting fascia to fascia on either side of the tabs was used with a 0 Vicryl.  The tabs were cut off.  3-0 Vicryl was used to reapproximate the umbilicus and subdermal tissue.  4-0 Monocryl was used to close the skin in a subcuticular fashion.  Dermabond was used.       The patient was brought in satisfactory condition to the [facility] prior to going home.             ______________________________  Roe RutherfordJoseph A Freemon Binford, MD    JAJ/MODL  DD:  06/30/2016 16:54:15  DT:  06/30/2016 22:58:35  Job #:  [id number]    cc:

## 2016-06-30 NOTE — Telephone Encounter (Signed)
Pt is calling in asking for a script for a stool softner and VENTOLIN HFA 108 (90 BASE) MCG/ACT inhaler

## 2016-06-30 NOTE — Telephone Encounter (Signed)
Patient contacted clinic and writer returned call. Had surgery. Patient to follow up PCP 07/14/2016.    Lovett CalenderJoe Lasean Rahming PT, DPT

## 2016-06-30 NOTE — Telephone Encounter (Signed)
Timothy French  05-04-1966    Patient Phone#:     06/30/2016  Timothy French      Form type to be completed:  [employer]    Fax# [phone]   Attention:      Mailing Address      Notify patient of completion:  no    Patient to pick up?  no    Form placed in bin for whom (nurse, provider):  Dr. Jonny RuizFortuna       When form is returned:    Was the form faxed?  Yes/ No   Date:     Was the form mailed? Yes/ No  Date:      Was the patient notified?  Yes/ No  Date:          **Reminder**  If patient does not pick up forms in 3 days, please mail.

## 2016-06-30 NOTE — Telephone Encounter (Signed)
Pt calling to speak to Joe in regards to his next steps

## 2016-06-30 NOTE — Telephone Encounter (Signed)
Called [employer] they stated that they will call the pt today and get this taken care of  Today Claris CheMargaret from [employer] said that she is going to call the pt and get any info  That she needs from him and get the CPAP machine to him ASAP    Thank you,    Andrey CampanileSandy

## 2016-07-01 MED ORDER — ALBUTEROL SULFATE HFA 108 (90 BASE) MCG/ACT IN AERS *I*
2.0000 | INHALATION_SPRAY | RESPIRATORY_TRACT | 5 refills | Status: DC | PRN
Start: 2016-07-01 — End: 2017-03-16

## 2016-07-01 NOTE — Telephone Encounter (Signed)
rx called into pharmacy

## 2016-07-04 NOTE — Telephone Encounter (Signed)
When form is returned:     Was the form faxed? Yes   Date: 07/04/16     Was the form mailed? No  Date:      Was the patient notified?  No  Date:

## 2016-07-13 ENCOUNTER — Encounter: Payer: Self-pay | Admitting: Surgery

## 2016-07-13 ENCOUNTER — Ambulatory Visit: Payer: Medicaid Other | Attending: Surgery | Admitting: Surgery

## 2016-07-13 VITALS — BP 146/84 | HR 94 | Temp 97.5°F | Resp 18 | Ht 66.0 in | Wt 231.0 lb

## 2016-07-13 DIAGNOSIS — Z4889 Encounter for other specified surgical aftercare: Secondary | ICD-10-CM | POA: Insufficient documentation

## 2016-07-13 DIAGNOSIS — K429 Umbilical hernia without obstruction or gangrene: Secondary | ICD-10-CM

## 2016-07-13 NOTE — Progress Notes (Signed)
Post-operative Visit    Subjective:       Timothy French presents to the clinic status post umbilical hernia repair with mesh on 06/28/16 by Roe RutherfordJohnson, Joseph A, MD.  Timothy French is eating a regular diet without difficulty.    Bowel movements are normal. Pain is controlled without any medications.. Complains of soreness,ran out of Norco today.     Objective:      BP 146/84   Pulse 94   Temp 36.4 C (97.5 F)   Resp 18   Ht 1.676 m (5\' 6" )   Wt 104.8 kg (231 lb)   SpO2 96%   BMI 37.28 kg/m2    General:  No acute distress   Heart:  Regular, rate, and rhythm   Lungs Clear to auscultation bilaterally   Abdomen: soft, bowel sounds active, non-tender. Drains/tubes:  none    Incision:   healing well, no drainage, no erythema, no hernia, no seroma, no swelling, no dehiscence, incision well approximated         Pathology reviewed: N/A    Assessment:      Timothy French is doing well postoperatively.  Take ibuprofen with food as needed     Plan:      1. Continue any current medications.  2. Wound care discussed.  3. Pt is to increase activities as tolerated.  4. Follow up appointment as needed. Patient instructed to call office with any questions or concerns.  5. Return to work: NA

## 2016-07-19 ENCOUNTER — Ambulatory Visit: Payer: Self-pay | Admitting: Primary Care

## 2016-08-10 ENCOUNTER — Ambulatory Visit: Payer: Medicaid Other | Attending: Primary Care | Admitting: Primary Care

## 2016-08-10 ENCOUNTER — Encounter: Payer: Self-pay | Admitting: Primary Care

## 2016-08-10 ENCOUNTER — Telehealth: Payer: Self-pay | Admitting: Primary Care

## 2016-08-10 VITALS — BP 116/76 | HR 90 | Ht 66.14 in | Wt 239.0 lb

## 2016-08-10 DIAGNOSIS — Z139 Encounter for screening, unspecified: Secondary | ICD-10-CM

## 2016-08-10 DIAGNOSIS — F319 Bipolar disorder, unspecified: Secondary | ICD-10-CM

## 2016-08-10 DIAGNOSIS — E039 Hypothyroidism, unspecified: Secondary | ICD-10-CM

## 2016-08-10 DIAGNOSIS — M545 Low back pain, unspecified: Secondary | ICD-10-CM

## 2016-08-10 DIAGNOSIS — M533 Sacrococcygeal disorders, not elsewhere classified: Secondary | ICD-10-CM

## 2016-08-10 DIAGNOSIS — K219 Gastro-esophageal reflux disease without esophagitis: Secondary | ICD-10-CM

## 2016-08-10 DIAGNOSIS — Z23 Encounter for immunization: Secondary | ICD-10-CM

## 2016-08-10 MED ORDER — GABAPENTIN 600 MG PO TABLET *I*
600.0000 mg | ORAL_TABLET | Freq: Three times a day (TID) | ORAL | 5 refills | Status: DC
Start: 2016-08-10 — End: 2017-04-27

## 2016-08-10 MED ORDER — OMEPRAZOLE 20 MG PO CPDR *I*
20.0000 mg | DELAYED_RELEASE_CAPSULE | Freq: Every day | ORAL | 5 refills | Status: DC
Start: 2016-08-10 — End: 2017-03-16

## 2016-08-10 MED ORDER — LEVOTHYROXINE SODIUM 75 MCG PO TABS *I*
ORAL_TABLET | ORAL | 1 refills | Status: DC
Start: 2016-08-10 — End: 2016-10-25

## 2016-08-10 NOTE — Progress Notes (Signed)
~  [facility]~    SUBJECTIVE     Snoring  --Doing well -- using CPAP consistently!    Thyroid  --on levothyroxine;   --No change in hair or skin.    --No thyroid tenderness.  No neck pain.    --reports adherence    ETOH  --None     HTN  --no CP, no SOB  --of of antihypertensives    Back pain  --Pain present for several years.   --worse in coccyx  --had traumatic fall approx 2 years ago  --Pain radiation from his lumbar region and coccyx down legs  --No change in strength in legs.  No fevers.  No bowel or bladder incontinence or retention.  --on gabapentin and NSAIDs -- not helping adequately  --engaged with physical therapy from many months    Pain Intensity:   Pain    08/10/16 1103   PainSc:   5   PainLoc: Abdomen         Medications Reviewed    OBJECTIVE     BP 116/76   Pulse 90   Ht 1.68 m (5' 6.14")   Wt 108.4 kg (239 lb)   SpO2 97%   BMI 38.41 kg/m2  General Appearance: No distress  RRR, no m  CTA       ASSESSMENT / DIAGNOSIS     Recommend continuing CPAP.  Stressed importance reviewed risks of untreated obstructive sleep apnea.       Back pain--given failure to improve with many months of conservative therapy - rec MRI to further evaluate         ORDERS AND PLAN     Need for vaccination  -     Flu Vaccine Quadrivalent greater than or equal to 3yo preservative free IM (AMBULATORY USE ONLY)    Hypothyroidism, unspecified type  -     levothyroxine (SYNTHROID, LEVOTHROID) 75 MCG tablet; TAKE 1 TABLET DAILY.    Bipolar affective disorder, remission status unspecified  -     levothyroxine (SYNTHROID, LEVOTHROID) 75 MCG tablet; TAKE 1 TABLET DAILY.    Gastroesophageal reflux disease without esophagitis  -     omeprazole (PRILOSEC) 20 MG capsule; Take 1 capsule (20 mg total) by mouth daily (before breakfast)    Lumbar back pain  -     gabapentin (NEURONTIN) 600 MG tablet; Take 1 tablet (600 mg total) by mouth 3 times daily  -     MR lumbar spine without and with contrast; Future    Bipolar 1 disorder  -      gabapentin (NEURONTIN) 600 MG tablet; Take 1 tablet (600 mg total) by mouth 3 times daily    Tail bone pain  -     MR lumbar spine without and with contrast; Future    Screening  -     AMB REFERRAL TO SURGERY          --Patient instructed to call if symptoms are not improving or worsening  --Follow-up arranged    Signed: Madolyn FriezeOBERT J Yue Flanigan, MD on 08/10/2016 at 1:46 PM  [facility]

## 2016-08-10 NOTE — Telephone Encounter (Signed)
Contacted [facility] [phone]    MR lumbar spine without and with contrast  08/18/16 @ 4:30 pm  No prep    Lm on vm

## 2016-08-16 ENCOUNTER — Other Ambulatory Visit: Payer: Self-pay

## 2016-08-16 LAB — CBC AND DIFFERENTIAL
Baso # K/uL: 0.1 10*3/uL (ref 0.0–0.2)
Basophil %: 0.6 % (ref 0.0–1.8)
Eos # K/uL: 0.2 10*3/uL (ref 0.0–0.5)
Eosinophil %: 1.7 % (ref 0.0–7.0)
Hematocrit: 38.7 % — ABNORMAL LOW (ref 40.0–52.0)
Hemoglobin: 13.2 g/dL (ref 13.0–17.5)
Lymph # K/uL: 3.4 10*3/uL (ref 0.9–3.8)
Lymphocyte %: 38.3 % (ref 17.0–44.0)
MCH: 30.9 PG (ref 26.0–34.0)
MCHC: 34.1 g/dL (ref 32.0–36.0)
MCV: 90.6 FL (ref 81.0–99.0)
Mono # K/uL: 0.8 10*3/uL (ref 0.2–1.0)
Monocyte %: 8.9 % (ref 4.0–12.0)
Neut # K/uL: 4.5 10*3/uL (ref 1.5–7.7)
Platelets: 222 10*3/uL (ref 140–400)
RBC: 4.27 10*6/uL (ref 4.20–5.90)
RDW: 13.5 % (ref 11.5–15.0)
Seg Neut %: 50.5 % (ref 40.0–75.0)
WBC: 8.8 10*3/uL (ref 4.0–10.8)

## 2016-08-21 LAB — VALPROIC ACID LEVEL, TOTAL AND FREE
Valproic Acid, Free: 21.3 mg/L — ABNORMAL HIGH (ref 4.8–17.3)
Valproic Acid, Total: 86.6 mg/L (ref 50.0–100.0)

## 2016-08-25 ENCOUNTER — Telehealth: Payer: Self-pay | Admitting: Primary Care

## 2016-08-25 NOTE — Telephone Encounter (Signed)
7Patient calling, has not heard from colorectal surgery yet. Provide patient with the following number [phone] to schedule appointment.

## 2016-08-30 ENCOUNTER — Ambulatory Visit: Payer: Medicaid Other

## 2016-08-30 ENCOUNTER — Telehealth: Payer: Self-pay

## 2016-09-05 ENCOUNTER — Telehealth: Payer: Self-pay | Admitting: Primary Care

## 2016-09-05 ENCOUNTER — Telehealth: Payer: Self-pay | Admitting: Surgery

## 2016-09-05 ENCOUNTER — Telehealth: Payer: Self-pay

## 2016-09-05 DIAGNOSIS — Z1211 Encounter for screening for malignant neoplasm of colon: Secondary | ICD-10-CM

## 2016-09-05 NOTE — Telephone Encounter (Signed)
Per Department of Medicine Gastroenterology and Hepatology (Gastroenterology)  ,    Unfortunately, due to an absolute contraindication (sever OSA) for our outpatient facility at [facility] this patient`s Colonoscopy has been cancelled. Please generate a new referral to [facility] to perform this procedure in hospital setting.    Thank you. Oneita HurtVIDMAR, DRAGANA Wed Aug 31, 2016 10:17 AM

## 2016-09-05 NOTE — Telephone Encounter (Signed)
Spoke with staff at Colorectal and they advised patient was not appropriate due to the degree of sleep apnea.     Patient has been scheduled for 10/05/2016 2:45 PM arrival on AC4. Patient reports he will bring his CPAP to procedure and answered NO to all other pre screen questions. Prep and consents mailed to address on file. Patient confirmed appointment.

## 2016-09-05 NOTE — Telephone Encounter (Signed)
[facility] calling to ask why this pts colonoscopy was cancelled with Dr. [facility]ered[facility] IdeFleming at [facility].  I spoke with Morrie SheldonAshley.  She said they normally don't mind sleep apnea, but this pts sleep study was not good.  He was 70's baseline and dropped to 50, so he's on the boarder of needing oxygen, so they don't feel comfortable sedating him at [facility] if his oxygen levels were to drop.

## 2016-09-05 NOTE — Telephone Encounter (Signed)
Mr. Timothy French is calling to check status of referral. The fax number has been confirmed and another fax is being sent.  Please contact him back to advise at [phone] or [phone].

## 2016-09-07 ENCOUNTER — Other Ambulatory Visit: Payer: Self-pay | Admitting: Oncology

## 2016-09-07 ENCOUNTER — Other Ambulatory Visit: Payer: Self-pay

## 2016-09-07 ENCOUNTER — Telehealth: Payer: Self-pay | Admitting: Oncology

## 2016-09-07 MED ORDER — POLYETHYLENE GLYCOL 3350 PO POWD *I*
ORAL | 0 refills | Status: DC
Start: 2016-09-07 — End: 2018-01-16

## 2016-09-07 MED ORDER — BISACODYL EC 5 MG PO TBEC *I*
DELAYED_RELEASE_TABLET | ORAL | 0 refills | Status: DC
Start: 2016-09-07 — End: 2018-01-16

## 2016-09-07 NOTE — Telephone Encounter (Signed)
Patient is calling to request a prescription for his prep materials for his COD on 11/15 to be sent to [facility] - [place], [place] - [address]. If necessary, patient can be reached at [phone].

## 2016-09-07 NOTE — Telephone Encounter (Signed)
Filled prep scripts. Routed to Dr. Julieta BelliniSteevens. Called patient letting him prep scripts were being taking care of.

## 2016-09-09 ENCOUNTER — Telehealth: Payer: Self-pay | Admitting: Primary Care

## 2016-09-09 ENCOUNTER — Other Ambulatory Visit: Payer: Medicaid Other

## 2016-09-09 NOTE — Telephone Encounter (Signed)
Pts MRI reschedule for November 7th so if he cant do stand up , he will need medication for his anxiety.

## 2016-09-09 NOTE — Telephone Encounter (Signed)
Pt called. Pt canceled appt for MRI today as he is claustrophobic and didn't have any medication to take. Pt states they also suggested to him about the stand up MRI? Pt was wondering if he could possibly do that? Please advise as pt. Would like a call back.

## 2016-09-12 MED ORDER — LORAZEPAM 1 MG PO TABS *I*
1.0000 mg | ORAL_TABLET | Freq: Once | ORAL | 0 refills | Status: AC
Start: 2016-09-12 — End: 2016-09-12

## 2016-09-12 NOTE — Telephone Encounter (Signed)
I called in one dose of ativan.  He needs to be monitored when he take this.  He needs a ride to and from the MRI.  He had ill-defined "low severity" bradycardia with klonopin, so need to monitor close.

## 2016-09-12 NOTE — Addendum Note (Signed)
Addended by: Madolyn FriezeFORTUNA, Nayelly Laughman J on: 09/12/2016 06:12 PM     Modules accepted: Orders

## 2016-09-13 NOTE — Telephone Encounter (Signed)
Patient aware.   Will have someone with him the whole time.

## 2016-09-14 ENCOUNTER — Telehealth: Payer: Self-pay

## 2016-09-14 NOTE — Telephone Encounter (Signed)
Spoke with patient regarding 11.16.17 appointment.  Patient confirmed date/time of appointment, location, directions received, and has transportation. Patient also confirmed he/she is not a diabetic and/or on blood thinning medications.

## 2016-09-14 NOTE — Telephone Encounter (Signed)
Patient uses a CPAP

## 2016-09-19 NOTE — Telephone Encounter (Signed)
Called patient b/c original prep instructions were sent back due to invalid address. Patient's current mailing address is [address]; [place], [place] [id number]. Mailed COB prep instructions 10/06/2016 out again.

## 2016-09-27 ENCOUNTER — Ambulatory Visit: Payer: Medicaid Other

## 2016-10-01 ENCOUNTER — Telehealth: Payer: Self-pay | Admitting: Internal Medicine

## 2016-10-01 NOTE — Telephone Encounter (Signed)
Patient had questions regarding prep for colonoscopy on 11/16. Answered questions.    Corey Haroldaren Jestin Burbach, MD on 10/01/2016 @ 11:02 AM

## 2016-10-05 ENCOUNTER — Other Ambulatory Visit: Payer: Self-pay | Admitting: Oncology

## 2016-10-06 ENCOUNTER — Encounter: Payer: Self-pay | Admitting: Oncology

## 2016-10-06 ENCOUNTER — Ambulatory Visit
Admission: RE | Admit: 2016-10-06 | Discharge: 2016-10-06 | Disposition: A | Discharge status: Home / Self Care | Payer: Self-pay | Attending: Oncology | Admitting: Oncology

## 2016-10-06 HISTORY — DX: Dependence on other enabling machines and devices: Z99.89

## 2016-10-06 HISTORY — DX: Sleep apnea, unspecified: G47.30

## 2016-10-06 LAB — HM COLONOSCOPY

## 2016-10-06 MED ORDER — PROMETHAZINE HCL 25 MG/ML IJ SOLN *I*
INTRAMUSCULAR | Status: AC
Start: 2016-10-06 — End: 2016-10-06
  Filled 2016-10-06: qty 1

## 2016-10-06 MED ORDER — MIDAZOLAM HCL 5 MG/5ML IJ SOLN *I*
INTRAMUSCULAR | Status: AC
Start: 2016-10-06 — End: 2016-10-06
  Filled 2016-10-06: qty 10

## 2016-10-06 MED ORDER — DIPHENHYDRAMINE HCL 50 MG/ML IJ SOLN *I*
INTRAMUSCULAR | Status: AC
Start: 2016-10-06 — End: 2016-10-06
  Filled 2016-10-06: qty 1

## 2016-10-06 MED ORDER — FENTANYL CITRATE 50 MCG/ML IJ SOLN *WRAPPED*
INTRAMUSCULAR | Status: AC | PRN
Start: 2016-10-06 — End: 2016-10-06
  Administered 2016-10-06: 50 ug via INTRAVENOUS
  Administered 2016-10-06 (×3): 25 ug via INTRAVENOUS

## 2016-10-06 MED ORDER — MIDAZOLAM HCL 5 MG/5ML IJ SOLN *I*
INTRAMUSCULAR | Status: AC | PRN
Start: 2016-10-06 — End: 2016-10-06
  Administered 2016-10-06 (×5): 2 mg via INTRAVENOUS

## 2016-10-06 MED ORDER — FENTANYL CITRATE 50 MCG/ML IJ SOLN *WRAPPED*
INTRAMUSCULAR | Status: AC
Start: 2016-10-06 — End: 2016-10-06
  Filled 2016-10-06: qty 4

## 2016-10-06 MED ORDER — MIDAZOLAM HCL 5 MG/5ML IJ SOLN *I*
INTRAMUSCULAR | Status: AC
Start: 2016-10-06 — End: 2016-10-06
  Filled 2016-10-06: qty 5

## 2016-10-06 MED ORDER — PROMETHAZINE HCL 25 MG/ML IJ SOLN *I*
INTRAMUSCULAR | Status: AC | PRN
Start: 2016-10-06 — End: 2016-10-06
  Administered 2016-10-06: 25 mg via INTRAVENOUS

## 2016-10-06 MED ORDER — SODIUM CHLORIDE 0.9 % IV SOLN WRAPPED *I*
100.0000 mL/h | Status: DC
Start: 2016-10-06 — End: 2016-10-07
  Administered 2016-10-06: 100 mL/h via INTRAVENOUS

## 2016-10-06 NOTE — Preop H&P (Signed)
OUTPATIENT PRE-PROCEDURE H&P    Chief Complaint / Indications for Procedure: CRCS, fam hx CRC    Past Medical History:     Past Medical History:   Diagnosis Date    Asthma     Bipolar affective     Carpal tunnel syndrome     Depression     Headache     History of stomach ulcers     Hypertension     Machine dependence     Sleep apnea      Past Surgical History:   Procedure Laterality Date    BACK SURGERY      cervical fusion    HERNIA REPAIR      inguinal    NECK SURGERY  2012     Family History   Problem Relation Age of Onset    Other Paternal Grandfather      mental illness    Hypertension Other     Mental illness Other     Other Other      thyroid    No Known Problems Mother     Diabetes Type 1 Father     High Blood Pressure Father     Cancer Daughter      Brain    Diabetes Type 1 Maternal Grandmother     Heart Disease Maternal Grandmother      Social History     Social History    Marital status: Single     Spouse name: N/A    Number of children: N/A    Years of education: N/A     Social History Main Topics    Smoking status: Current Every Day Smoker     Packs/day: 1.00     Types: Cigarettes    Smokeless tobacco: Never Used    Alcohol use No      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    Drug use: No    Sexual activity: Yes     Partners: Female     Other Topics Concern    None     Social History Narrative       Allergies:    Allergies   Allergen Reactions    Latex Rash    Bee Venom Swelling    Clonazepam Other (See Comments)     Bradycardia    Naproxen Rash    Tramadol Rash    No Known Drug Allergy      Created by Conversion - 0;        Medications:    (Not in a hospital admission)    Current Outpatient Prescriptions   Medication    polyethylene glycol (GLYCOLAX) powder    bisacodyl (DULCOLAX) 5 MG EC tablet    levothyroxine (SYNTHROID, LEVOTHROID) 75 MCG tablet    omeprazole (PRILOSEC) 20 MG capsule    gabapentin (NEURONTIN) 600 MG tablet    albuterol HFA (VENTOLIN HFA) 108  (90 BASE) MCG/ACT inhaler    divalproex (DEPAKOTE ER) 500 MG 24 hr tablet    QUEtiapine (SEROQUEL) 100 MG tablet    quetiapine (SEROQUEL) 300 MG tablet    venlafaxine (EFFEXOR-XR) 150 MG 24 hr capsule    auto-titrating CPAP (AUTOSET) machine    cyclobenzaprine (FLEXERIL) 10 MG tablet     Current Facility-Administered Medications   Medication Dose Route Frequency    sodium chloride 0.9 % IV  100 mL/hr Intravenous Continuous     Vitals:    10/06/16 1421   BP: 139/78   Pulse: 84  Resp: 16   Temp: 36.7 C (98.1 F)   Weight: 108.9 kg (240 lb)   Height: 167.6 cm (5\' 6" )       Review of Systems   Constitutional: Negative for fever.   Respiratory: Negative for shortness of breath.    Cardiovascular: Negative for chest pain.       Physical Examination:  Head/Nose/Throat:negative  Lungs:Lungs clear  Cardiovascular:normal S1 and S2  Abdomen: abdomen soft, non-tender, nondistended, normal active bowel sounds, no masses or organomegaly      Lab Results: none    Radiology impressions (last 30 days):  No results found.    Currently Active Problems:  Patient Active Problem List   Diagnosis Code    Asthma J45.909    Hypothyroidism E03.9    Bipolar Disorder F30.9    Cervical Vertebral Fusion     Chronic Pain G89.29    Esophageal Reflux K21.9    Nicotine Dependence F17.200    Cervicalgia M54.2    Difficulty Breathing (Dyspnea) R06.02    Joint Pain, Localized In The Hip M25.559    Backache M54.9    Carpal Tunnel Syndrome G56.00    ACP Staging Stage 1 Hypertension: 140-159 / 90-99 I10    Hypertension I10    Asthma J45.909    Bipolar affective F31.9    S/P umbilical hernia repair 06/28/16 K42.9    Aftercare following surgery for injury and trauma Z48.89        Impression:  Screening colonoscopy    Plan:  Colonoscopy  Moderate Sedation    UPDATES TO PATIENT'S CONDITION on the DAY OF SURGERY/PROCEDURE    I. Updates to Patient's Condition (to be completed by a provider privileged to complete a H&P, following  reassessment of the patient by the provider):    Full H&P done today; no updates needed.    II. Procedure Readiness   I have reviewed the patient's H&P and updated condition. By completing and signing this form, I attest that this patient is ready for surgery/procedure.      III. Attestation   I have reviewed the updated information regarding the patient's condition and it is appropriate to proceed with the planned surgery/procedure.    Jari Favrehristopher Eiko Mcgowen, MD as of 3:23 PM 10/06/2016

## 2016-10-06 NOTE — Procedures (Addendum)
Colonoscopy Procedure Note   Date of Procedure: 10/06/2016   Referring Physician: Madolyn FriezeFortuna, Robert J, MD  Primary Physician: Madolyn FriezeFortuna, Robert J, MD        Attending Physician: Jari Favrehristopher Braxen Dobek, MD  Fellow:   none    Indications:  CRCS    Prior Colonoscopy: no    Medications:  Versed 10mg  IV and fentanyl 125mcg IV and phenergan 25mg  IV    I personally ordered and supervised administration of all conscious sedation medications.    Moderate Sedation Face Times  Start Time: (S) 1526     Scope:  Standard colonoscope    Informed Consent:  Full disclosure of risks, benefits, alternatives and potential complications were reviewed with patient as detailed on the consent form and informed consent was thus obtained.     Procedure Details:  The patient was placed in the left lateral decubitus position and monitored with continuous pulse oximetry, interval blood pressure monitoring and direct observations.     After anorectal examination was performed, the colonoscope was inserted into the rectum, and the scope advanced to the cecum, where the ileocecal valve was clearly identified, the cecal cap well visualized, and the preparation throughout the colon was adequate.  [facility] Bowel Preparation Scale (BBPS): Right colon = 3 ; transverse colon = 3 ; left colon = 3  A retroflexed view of the rectum was performed, which revealed internal hemorrhoids. The patient tolerated the procedure,  there were no immediate complications, and the patient was transferred to recovery in stable fashion.      Scope Times:     Insertion Time: (S) 1537  Cecum Time: (S) 1542  Exit Time: (S) 1613      Findings:      The rectal exam was normal.  Then, the scope was easily advanced to the cecum as noted above.  The ileum was intubated in a short distance and was normal.  No colitis or Crohn's disease was seen. Two separate right colon polyps 3-615mm were removed with cold snare.  A single 14mm pedunculated polyp was removed with hot snare at 20 watts.   Left colonic diverticulosis was present.  Internal hemorrhoids were present.  Two separate 3mm rectal polyps were removed with cold snare.     The patient tolerated the procedure well and there were no immediate complications.     Impression:     14mm pedunculated left colon polyp removed with hot snare.   Two diminutive right colon polyps removed.   2 benign appearing rectal polyps removed.   Left colonic diverticulosis.   Internal hemorrhoids.    Recommendations:      Await pathology   Based on the current guidelines taking into account this patients specific clinical information, the recommend interval to repeat colonoscopy is in 3 years unless symptoms develop or family history changes.  Pathology will be added to this report as an addendum and recommendations are subject to change.    Findings and suggested plans discussed with patient and next of kin prior to discharge.     Jari Favrehristopher Harini Dearmond, MD     [phone]     FINAL DIAGNOSIS:   (A) Colon, right, polyps:   - Tubular adenoma.     (B) Colon, descending, polyp:   - Tubular adenoma.     (C) Rectum, polyps:   - Fragments of tubular adenoma(s).   - Hyperplastic polyp(s).         Addendum:  Repeat colonoscopy in 3 years.  Jari Favrehristopher Gerrod Maule, MD

## 2016-10-06 NOTE — Discharge Instructions (Signed)
Gastroenterology Unit  Discharge Instructions for Colonoscopy      10/06/2016    4:17 PM    Colonoscopy and Polyp(s) Removed    Do not drive, operate heavy machinery, drink alcoholic beverages, make important personal or business decisions, or sign legal documents until the next day.      Return to your usual diet   Return to taking your usual medications    Things you may expect:   A small amount of bright red blood in your stool   It may be a few days before you have a bowel movement   You may have cramping, bloating, and feelings of "gas". These feelings should go away as you pass gas. If you still feel uncomfortable, walking around will help to pass the gas.   You were given medication to help you relax during the test. You may feel "fuzzy" and drowsy. Go home and rest for at least 4-6 hours.    You should call your doctor for any of the following:   Bad stomach pain   Fever   Bright red bleeding or clots (This may happen up to 20 days after the test.)   Dizziness or weakness that gets worse or lasts up to 24 hours.   Pain or redness at the IV site    If you have a serious problem after hours, Call [phone] to reach the GI physician on call. If you are unable to reach your doctor, go to the [facility].    Follow Up Care:   If biopsies were taken during your procedure, we will send you the pathology results within 7-10 days. If you do not receive your pathology results after 10 days please call [phone]   Report will be sent to your primary doctor.   Repeat exam in  3 year(s)    New Prescriptions    No medications on file

## 2016-10-07 ENCOUNTER — Ambulatory Visit: Payer: Medicaid Other

## 2016-10-07 ENCOUNTER — Other Ambulatory Visit: Payer: Self-pay | Admitting: Primary Care

## 2016-10-10 LAB — SURGICAL PATHOLOGY

## 2016-10-11 ENCOUNTER — Ambulatory Visit: Admission: RE | Admit: 2016-10-11 | Payer: Self-pay | Source: Ambulatory Visit | Admitting: Surgery

## 2016-10-11 ENCOUNTER — Ambulatory Visit: Payer: Self-pay | Admitting: Primary Care

## 2016-10-11 SURGERY — COLONOSCOPY

## 2016-10-24 NOTE — Telephone Encounter (Signed)
IR pre calls

## 2016-10-25 ENCOUNTER — Other Ambulatory Visit: Payer: Self-pay | Admitting: Primary Care

## 2016-10-25 DIAGNOSIS — F319 Bipolar disorder, unspecified: Secondary | ICD-10-CM

## 2016-10-25 DIAGNOSIS — E039 Hypothyroidism, unspecified: Secondary | ICD-10-CM

## 2016-10-25 NOTE — Telephone Encounter (Signed)
Last rx 09/24/16   Last seen 07/19/16

## 2016-10-25 NOTE — Addendum Note (Signed)
Addended by: Nancy NordmannOBBINS, Yisroel Mullendore W on: 10/25/2016 02:45 PM     Modules accepted: Orders

## 2016-10-26 ENCOUNTER — Telehealth: Payer: Self-pay | Admitting: Primary Care

## 2016-10-26 NOTE — Telephone Encounter (Signed)
Demographic sheet and order faxed to Dr Goldstein's office. They will contact the pt with an appt

## 2016-11-01 ENCOUNTER — Ambulatory Visit: Payer: Medicaid Other | Admitting: Primary Care

## 2016-11-04 ENCOUNTER — Telehealth: Payer: Self-pay | Admitting: Primary Care

## 2016-11-04 NOTE — Telephone Encounter (Signed)
Per the Surgery Dept    ----- Message -----   From: Jari FavreSteevens, Christopher, MD   Sent: 11/04/2016  1:04 PM    To: Blinda LeatherwoodLisa Balkum      Another colonoscopy is not indicated. Please cancel referral or have them advise why they are requesting a repeat colonoscopy. Jari Favrehristopher Steevens, MD        ----- Message -----   From: Blinda LeatherwoodBalkum, Lisa   Sent: 11/02/2016  8:53 AM    To: Jari Favrehristopher Steevens, MD    You did colonoscopy on this patient in November and they are sending another referral for colonoscopy, is another colonoscopy needed?         Message sent to Dr. Julieta BelliniSteevens inquiring if another colonoscopy is needed.  Blinda LeatherwoodBALKUM, LISA Wed Nov 02, 2016 8:54 AM    This patient had a colonoscopy with Dr. Jul[facility]Steevens at [facility] on 10/06/16-Repeat colonoscopy in 3 years. Jari Favrehristopher Steevens, MD Blinda LeatherwoodBALKUM, LISA Tue Nov 01, 2016 2:15 PM    Awaiting md response Merrily BrittleCOWENS, KRISTIE M Mon Sep 05, 2016 1:14 PM    Unfortunately, due to an absolute contraindication (sever OSA) for our [facility] this patient`s Colonoscopy has been cancelled. Please generate a new referral [facility] to perform this procedure in hospital setting.    Thank you. Oneita HurtVIDMAR, DRAGANA Wed Aug 31, 2016 10:17 AM    Thank you for the referral . The patient has been scheduled to see Dr. Meredeth IdeFleming on 10/11/16 at 1:30pm and the patient has been notified. Our office is located at [address], [place], [place] [id number]. We can be reached at [phone] with any questions. Thank you again for the referral.    Leonia ReevesESSIN, SHANNON Thu Aug 25, 2016 11:49 AM

## 2016-12-06 ENCOUNTER — Ambulatory Visit: Payer: Medicaid Other | Admitting: Primary Care

## 2016-12-27 ENCOUNTER — Ambulatory Visit: Payer: Medicaid Other | Admitting: Primary Care

## 2016-12-27 ENCOUNTER — Other Ambulatory Visit: Payer: Self-pay | Admitting: Internal Medicine

## 2016-12-27 DIAGNOSIS — E039 Hypothyroidism, unspecified: Secondary | ICD-10-CM

## 2016-12-27 DIAGNOSIS — F319 Bipolar disorder, unspecified: Secondary | ICD-10-CM

## 2016-12-27 NOTE — Telephone Encounter (Signed)
Last rx 10/26/16  Last seen 08/10/16

## 2017-02-07 ENCOUNTER — Ambulatory Visit: Payer: Medicaid Other | Admitting: Primary Care

## 2017-03-16 ENCOUNTER — Encounter: Payer: Self-pay | Admitting: Gastroenterology

## 2017-03-16 ENCOUNTER — Encounter: Payer: Self-pay | Admitting: Primary Care

## 2017-03-16 ENCOUNTER — Ambulatory Visit: Payer: Medicaid Other | Attending: Primary Care | Admitting: Primary Care

## 2017-03-16 ENCOUNTER — Telehealth: Payer: Self-pay | Admitting: Primary Care

## 2017-03-16 ENCOUNTER — Other Ambulatory Visit
Admission: RE | Admit: 2017-03-16 | Discharge: 2017-03-16 | Disposition: A | Discharge status: Home / Self Care | Payer: Medicaid Other | Source: Ambulatory Visit | Attending: Primary Care | Admitting: Primary Care

## 2017-03-16 VITALS — BP 110/74 | HR 96 | Ht 66.14 in | Wt 243.0 lb

## 2017-03-16 DIAGNOSIS — R0789 Other chest pain: Secondary | ICD-10-CM

## 2017-03-16 DIAGNOSIS — E039 Hypothyroidism, unspecified: Secondary | ICD-10-CM

## 2017-03-16 DIAGNOSIS — F319 Bipolar disorder, unspecified: Secondary | ICD-10-CM

## 2017-03-16 DIAGNOSIS — E663 Overweight: Secondary | ICD-10-CM | POA: Insufficient documentation

## 2017-03-16 DIAGNOSIS — J449 Chronic obstructive pulmonary disease, unspecified: Secondary | ICD-10-CM

## 2017-03-16 DIAGNOSIS — J45909 Unspecified asthma, uncomplicated: Secondary | ICD-10-CM

## 2017-03-16 DIAGNOSIS — K219 Gastro-esophageal reflux disease without esophagitis: Secondary | ICD-10-CM

## 2017-03-16 DIAGNOSIS — Z9989 Dependence on other enabling machines and devices: Secondary | ICD-10-CM

## 2017-03-16 DIAGNOSIS — G4733 Obstructive sleep apnea (adult) (pediatric): Secondary | ICD-10-CM

## 2017-03-16 LAB — TSH: TSH: 2.43 u[IU]/mL (ref 0.27–4.20)

## 2017-03-16 LAB — COMPREHENSIVE METABOLIC PANEL
ALT: 31 U/L (ref 0–50)
AST: 20 U/L (ref 0–50)
Albumin: 4.5 g/dL (ref 3.5–5.2)
Alk Phos: 65 U/L (ref 40–130)
Anion Gap: 18 — ABNORMAL HIGH (ref 7–16)
Bilirubin,Total: 0.3 mg/dL (ref 0.0–1.2)
CO2: 25 mmol/L (ref 20–28)
Calcium: 9.9 mg/dL (ref 8.6–10.2)
Chloride: 98 mmol/L (ref 96–108)
Creatinine: 1.04 mg/dL (ref 0.67–1.17)
GFR,Black: 96 *
GFR,Caucasian: 83 *
Glucose: 88 mg/dL (ref 60–99)
Lab: 10 mg/dL (ref 6–20)
Potassium: 5 mmol/L (ref 3.3–5.1)
Sodium: 141 mmol/L (ref 133–145)
Total Protein: 8.2 g/dL — ABNORMAL HIGH (ref 6.3–7.7)

## 2017-03-16 LAB — CBC AND DIFFERENTIAL
Baso # K/uL: 0.1 10*3/uL (ref 0.0–0.1)
Basophil %: 1 %
Eos # K/uL: 0.1 10*3/uL (ref 0.0–0.5)
Eosinophil %: 1.2 %
Hematocrit: 44 % (ref 40–51)
Hemoglobin: 14.2 g/dL (ref 13.7–17.5)
IMM Granulocytes #: 0.1 10*3/uL (ref 0.0–0.1)
IMM Granulocytes: 0.7 %
Lymph # K/uL: 3.8 10*3/uL — ABNORMAL HIGH (ref 1.3–3.6)
Lymphocyte %: 40.4 %
MCH: 31 pg/cell (ref 26–32)
MCHC: 32 g/dL (ref 32–37)
MCV: 97 fL — ABNORMAL HIGH (ref 79–92)
Mono # K/uL: 1.1 10*3/uL — ABNORMAL HIGH (ref 0.3–0.8)
Monocyte %: 12 %
Neut # K/uL: 4.2 10*3/uL (ref 1.8–5.4)
Nucl RBC # K/uL: 0 10*3/uL (ref 0.0–0.0)
Nucl RBC %: 0 /100 WBC (ref 0.0–0.2)
Platelets: 253 10*3/uL (ref 150–330)
RBC: 4.6 MIL/uL (ref 4.6–6.1)
RDW: 13.2 % (ref 11.6–14.4)
Seg Neut %: 44.7 %
WBC: 9.4 10*3/uL — ABNORMAL HIGH (ref 4.2–9.1)

## 2017-03-16 LAB — LIPID PANEL
Chol/HDL Ratio: 5.3
Cholesterol: 211 mg/dL — AB
HDL: 40 mg/dL
LDL Calculated: 146 mg/dL — AB
Non HDL Cholesterol: 171 mg/dL
Triglycerides: 127 mg/dL

## 2017-03-16 MED ORDER — OMEPRAZOLE 20 MG PO CPDR *I*
20.0000 mg | DELAYED_RELEASE_CAPSULE | Freq: Every day | ORAL | 5 refills | Status: DC
Start: 2017-03-16 — End: 2017-05-31

## 2017-03-16 MED ORDER — FLUTICASONE-SALMETEROL 232-14 MCG/ACT IN AEPB *A*
1.0000 | INHALATION_SPRAY | Freq: Two times a day (BID) | RESPIRATORY_TRACT | 2 refills | Status: DC
Start: 2017-03-16 — End: 2017-05-31

## 2017-03-16 MED ORDER — ALBUTEROL SULFATE HFA 108 (90 BASE) MCG/ACT IN AERS *I*
2.0000 | INHALATION_SPRAY | RESPIRATORY_TRACT | 5 refills | Status: DC | PRN
Start: 2017-03-16 — End: 2017-05-31

## 2017-03-16 MED ORDER — GENERIC DME *A*
0 refills | Status: DC
Start: 2017-03-16 — End: 2017-07-12

## 2017-03-16 MED ORDER — LEVOTHYROXINE SODIUM 75 MCG PO TABS *I*
75.0000 ug | ORAL_TABLET | Freq: Every day | ORAL | 1 refills | Status: DC
Start: 2017-03-16 — End: 2017-06-01

## 2017-03-16 NOTE — Progress Notes (Signed)
~[facility]~    SUBJECTIVE     Snoring  --unable to get CPAP equipment  --has CPAP machine    COPD  --still smoking -- 1/2 ppd  --reports some shortness of breath  --limited exercise tolerance related to SOB  --occ chest pain    Thyroid  --on levothyroxine;   --No change in hair or skin.    --no recent labs    ETOH  --None     HTN  --intermittent CP, difficult to characterize.  --mostly pressure.  Partially exertionally  --no current CP, no SOB  --of of antihypertensives    Back pain  --did not get prior MRI ordered.  --pain present for years -- located in coccyx region      Pain Intensity:   Pain    03/16/17 0950   PainSc:   7   PainLoc: Back         Medications Reviewed    OBJECTIVE     BP 110/74   Pulse 96   Ht 1.68 m (5' 6.14")   Wt 110.2 kg (243 lb)   SpO2 99%   BMI 39.05 kg/m2  General Appearance: No distress  RRR, no m  CTA     EKG-- NSR, nl axis.  No changes from prior    Spirometry -- FEV1 75% predicted, FEV1/FVC - 79    ASSESSMENT / DIAGNOSIS     Chest pain vague in history -- but description of pressure, radiation to arm concerning.  Given risk factors, recommend further stress testing.     Hypothyroidism-recommend continue levothyroxin.  He is overdue to check his TSH.  Labs ordered as below.    Spirometry consistent with mild obstructive etiology.  Symptoms consistent with COPD.  Recommend continued albuterol as needed    Prescription written for CPAP supplies.  Stressed importance of continued to use CPAP    ORDERS AND PLAN     Other chest pain  -     Exercise Stress Echo Complete; Future    Hypothyroidism, unspecified type  -     levothyroxine (SYNTHROID, LEVOTHROID) 75 MCG tablet; Take 1 tablet (75 mcg total) by mouth daily    Bipolar affective disorder, remission status unspecified  -     levothyroxine (SYNTHROID, LEVOTHROID) 75 MCG tablet; Take 1 tablet (75 mcg total) by mouth daily    Gastroesophageal reflux disease without esophagitis  -     omeprazole (PRILOSEC) 20 MG capsule; Take 1  capsule (20 mg total) by mouth daily (before breakfast)  -     fluticasone-salmeterol (AIRDUO RESPICLICK) 232-14 MCG/ACT inhaler; Inhale 1 puff into the lungs 2 times daily    Uncomplicated asthma  -     albuterol HFA (VENTOLIN HFA) 108 (90 BASE) MCG/ACT inhaler; Inhale 2 puffs into the lungs every 4 hours as needed for Wheezing   Shake well before each use.    Chronic obstructive pulmonary disease, unspecified COPD type  -     Comprehensive metabolic panel; Future  -     TSH; Future  -     CBC and differential; Future    Overweight  -     Hemoglobin A1c; Future  -     Lipid add Rfx to Drt LDL if Trig >400; Future    OSA on CPAP  -     generic DME; CPAP supplies -- mask, tubing, filter  Use as directed.  G47.33        --Patient instructed to call if symptoms are  not improving or worsening  --Follow-up arranged    Signed: Madolyn Frieze, MD on 03/16/2017 at 1:52 PM  [facility]

## 2017-03-16 NOTE — Telephone Encounter (Signed)
Exercise Stress Echo Complete  [facility]  April 27 at 10:00am  [address]  [phone]

## 2017-03-17 ENCOUNTER — Ambulatory Visit: Payer: Medicaid Other | Admitting: Cardiology

## 2017-03-17 LAB — HEMOGLOBIN A1C: Hemoglobin A1C: 5.6 % (ref 4.0–6.0)

## 2017-03-22 ENCOUNTER — Encounter: Payer: Self-pay | Admitting: Primary Care

## 2017-03-22 ENCOUNTER — Inpatient Hospital Stay: Admit: 2017-03-22 | Payer: Medicaid Other | Admitting: Cardiology

## 2017-03-24 ENCOUNTER — Other Ambulatory Visit: Payer: Medicaid Other

## 2017-03-29 ENCOUNTER — Encounter: Payer: Self-pay | Admitting: Gastroenterology

## 2017-03-29 ENCOUNTER — Ambulatory Visit
Admission: RE | Admit: 2017-03-29 | Discharge: 2017-03-29 | Disposition: A | Discharge status: Home / Self Care | Payer: Medicaid Other | Source: Ambulatory Visit | Attending: Cardiology | Admitting: Cardiology

## 2017-03-29 DIAGNOSIS — R0789 Other chest pain: Secondary | ICD-10-CM | POA: Insufficient documentation

## 2017-03-29 LAB — EXERCISE STRESS ECHO COMPLETE
Aortic Arch Diameter: 2.6 cm
Aortic Diameter (mid tubular): 3 cm
Aortic Diameter (sinus of Valsalva): 3 cm
BMI: 38.1 kg/m2
BP Diastolic: 70 mmHg
BP Systolic: 114 mmHg
BSA: 2.24 m2
Deceleration Time - MV: 217 ms
E/A ratio: 0.98
Estimated workload: 7 METS
Heart Rate: 76 {beats}/min
Height: 66.14 in
IVC Diameter: 15.9 mm
LA Diameter BSA Index: 1.8 cm/m2
LA Diameter Height Index: 2.4 cm/m
LA Diameter: 4.1 cm
LA Systolic Vol BSA Index: 38.9 mL/m2
LA Systolic Vol Height Index: 51.9 mL/m
LA Systolic Volume: 87.2 mL
LV ASE Mass BSA Index: 89.6 gm/m2
LV ASE Mass Height 2.7 Index: 49.5 gm/m2.7
LV ASE Mass Height Index: 119.5 gm/m
LV ASE Mass: 200.8 gm
LV CO BSA Index: 2.55 L/min/m2
LV Cardiac Output: 5.71 L/min
LV Diastolic Volume Index: 57 mL/m2
LV Posterior Wall Thickness: 1 cm
LV SV - LVOT SV Diff: -3.94 mL
LV SV BSA Index: 33.5 mL/m2
LV SV Height Index: 44.7 mL/m
LV Septal Thickness: 1.1 cm
LV Stroke Volume: 75.1 mL
LV Systolic Volume Index: 23.4 mL/m2
LVED Diameter BSA Index: 2.3 cm/m2
LVED Diameter Height Index: 3 cm/m
LVED Diameter: 5.1 cm
LVED Volume BSA Index: 57 mL/m2
LVED Volume BSA Index: 57 ml/m2
LVED Volume Height Index: 76 mL/m
LVED Volume: 127.6 mL
LVEF (Volume): 59 %
LVES Diameter BSA Index: 1.4 cm/m2
LVES Diameter Height Index: 1.9 cm/m
LVES Diameter: 3.2 cm
LVES Volume BSA Index: 23 ml/m2
LVES Volume BSA Index: 23.4 mL/m2
LVES Volume Height Index: 31.3 mL/m
LVES Volume: 52.5 mL
LVOT Area (calculated): 4.12 cm2
LVOT Cardiac Index: 2.68 L/min/m2
LVOT Cardiac Output: 6.01 L/min
LVOT Diameter: 2.29 cm
LVOT PWD VTI: 19.2 cm
LVOT PWD Velocity (mean): 70 cm/s
LVOT PWD Velocity (peak): 103 cm/s
LVOT SV BSA Index: 35.29 mL/m2
LVOT SV Height Index: 47 mL/m
LVOT Stroke Rate (mean): 288.2 mL/s
LVOT Stroke Rate (peak): 424 mL/s
LVOT Stroke Volume: 79.04 cc
MPHR: 170 {beats}/min
MR Regurgitant Fraction (LV SV Mtd): -0.05
MR Regurgitant Volume (LV SV Mtd): -3.9 mL
MV Peak A Velocity: 65.5 cm/s
MV Peak E Velocity: 64.2 cm/s
Mitral Annular E/Ea Vel Ratio: 8.24
Mitral Annular Ea Velocity: 7.79 cm/s
O2 sat peak: 95 %
O2 sat rest: 99 %
Peak DBP: 70 mmHg
Peak HR: 146 {beats}/min
Peak SBP: 160 mmHg
Percent MPHR: 85.9 %
RA Volume BSA Index: 16.5 mL/m2
RA Volume Height Index: 22 mL/m
RA Volume: 37 mL
RPP: 23360 BPM x mmHG
RR Interval: 789.47 ms
RVED Diameter BSA Index: 1.5 cm/m2
RVED Diameter Height Index: 2 cm/m
RVED Diameter: 3.3 cm
Stress Peak Stage: 2.08
Stress duration (min): 6 min
Stress duration (sec): 15 s
Tricuspid Annular Systolic Plane Excursion (TAPSE): 1.8 cm
Weight (lbs): 236.3 [lb_av]
Weight: 3780.8 oz

## 2017-03-29 MED ORDER — SULFUR HEXAFLUORIDE MICROSPH 60.7-25 MG (LUMASON) IV SUSR *I*
2.0000 mL | INTRAMUSCULAR | Status: AC | PRN
Start: 2017-03-29 — End: 2017-03-29
  Administered 2017-03-29: 2 mL via INTRAVENOUS

## 2017-04-18 ENCOUNTER — Ambulatory Visit: Payer: Medicaid Other | Admitting: Primary Care

## 2017-04-27 ENCOUNTER — Ambulatory Visit: Payer: Medicaid Other | Admitting: Primary Care

## 2017-04-27 ENCOUNTER — Other Ambulatory Visit: Payer: Self-pay | Admitting: Primary Care

## 2017-04-27 DIAGNOSIS — M545 Low back pain, unspecified: Secondary | ICD-10-CM

## 2017-04-27 DIAGNOSIS — F319 Bipolar disorder, unspecified: Secondary | ICD-10-CM

## 2017-04-27 NOTE — Telephone Encounter (Signed)
Patient last seen:  03/16/2017    Last Rx:  01/22/2017

## 2017-05-11 ENCOUNTER — Ambulatory Visit: Payer: Medicaid Other | Admitting: Primary Care

## 2017-05-31 ENCOUNTER — Other Ambulatory Visit: Payer: Self-pay | Admitting: Primary Care

## 2017-05-31 DIAGNOSIS — E039 Hypothyroidism, unspecified: Secondary | ICD-10-CM

## 2017-05-31 DIAGNOSIS — J45909 Unspecified asthma, uncomplicated: Secondary | ICD-10-CM

## 2017-05-31 DIAGNOSIS — F319 Bipolar disorder, unspecified: Secondary | ICD-10-CM

## 2017-05-31 DIAGNOSIS — M545 Low back pain, unspecified: Secondary | ICD-10-CM

## 2017-05-31 DIAGNOSIS — K219 Gastro-esophageal reflux disease without esophagitis: Secondary | ICD-10-CM

## 2017-05-31 NOTE — Telephone Encounter (Signed)
Pt has enough medication for tomorrow then will be all out of all 9 medications.

## 2017-06-01 ENCOUNTER — Other Ambulatory Visit: Payer: Self-pay | Admitting: Primary Care

## 2017-06-01 DIAGNOSIS — F319 Bipolar disorder, unspecified: Secondary | ICD-10-CM

## 2017-06-01 DIAGNOSIS — E039 Hypothyroidism, unspecified: Secondary | ICD-10-CM

## 2017-06-01 NOTE — Telephone Encounter (Signed)
Last Seen:  03/16/2017    Last Rx:  04/27/2017, 03/16/2017        Lab results: 03/16/17  1151   TSH 2.43

## 2017-06-05 MED ORDER — FLUTICASONE-SALMETEROL 232-14 MCG/ACT IN AEPB *A*
1.0000 | INHALATION_SPRAY | Freq: Two times a day (BID) | RESPIRATORY_TRACT | 2 refills | Status: DC
Start: 2017-06-05 — End: 2017-07-12

## 2017-06-05 MED ORDER — GABAPENTIN 600 MG PO TABLET *I*
600.0000 mg | ORAL_TABLET | Freq: Three times a day (TID) | ORAL | 5 refills | Status: DC
Start: 2017-06-05 — End: 2018-01-16

## 2017-06-05 MED ORDER — OMEPRAZOLE 20 MG PO CPDR *I*
20.0000 mg | DELAYED_RELEASE_CAPSULE | Freq: Every day | ORAL | 5 refills | Status: DC
Start: 2017-06-05 — End: 2018-01-16

## 2017-06-05 MED ORDER — ALBUTEROL SULFATE HFA 108 (90 BASE) MCG/ACT IN AERS *I*
2.0000 | INHALATION_SPRAY | RESPIRATORY_TRACT | 5 refills | Status: DC | PRN
Start: 2017-06-05 — End: 2017-07-12

## 2017-06-05 MED ORDER — LEVOTHYROXINE SODIUM 75 MCG PO TABS *I*
75.0000 ug | ORAL_TABLET | Freq: Every day | ORAL | 1 refills | Status: DC
Start: 2017-06-05 — End: 2017-10-26

## 2017-06-12 ENCOUNTER — Ambulatory Visit: Payer: Medicaid Other | Admitting: Primary Care

## 2017-06-27 ENCOUNTER — Telehealth: Payer: Self-pay | Admitting: Primary Care

## 2017-06-27 NOTE — Telephone Encounter (Signed)
Timothy French from [facility] is calling to inform MD about medication management for pt  Past few months been going towards discharge  Before dan finalize discharge wants to speak to MD about taking over the medication refills.  Please advise

## 2017-06-28 NOTE — Telephone Encounter (Signed)
Left message to call the office and speak to a nurse

## 2017-06-30 NOTE — Telephone Encounter (Signed)
[facility] to [facility].  Pt has been doing well for the past few months and Jesusita OkaDan would like to be able to discharge him and give managing pt's Seroquel, Depakote and Effexor over to PCP. No recent changes in his medication.  Next Wednesday they are planning to finalize the discharge. They want to make sure this is ok with Dr Jonny RuizFortuna.

## 2017-06-30 NOTE — Telephone Encounter (Signed)
Left message for Timothy French on voicemail.

## 2017-06-30 NOTE — Telephone Encounter (Signed)
This is OK

## 2017-07-12 ENCOUNTER — Ambulatory Visit: Payer: Medicaid Other | Attending: Primary Care | Admitting: Primary Care

## 2017-07-12 ENCOUNTER — Telehealth: Payer: Self-pay | Admitting: Primary Care

## 2017-07-12 ENCOUNTER — Encounter: Payer: Self-pay | Admitting: Primary Care

## 2017-07-12 VITALS — BP 130/84 | HR 105 | Temp 97.3°F | Ht 66.0 in | Wt 232.9 lb

## 2017-07-12 DIAGNOSIS — K219 Gastro-esophageal reflux disease without esophagitis: Secondary | ICD-10-CM

## 2017-07-12 DIAGNOSIS — Z9989 Dependence on other enabling machines and devices: Secondary | ICD-10-CM

## 2017-07-12 DIAGNOSIS — G4733 Obstructive sleep apnea (adult) (pediatric): Secondary | ICD-10-CM

## 2017-07-12 DIAGNOSIS — J45909 Unspecified asthma, uncomplicated: Secondary | ICD-10-CM

## 2017-07-12 DIAGNOSIS — J019 Acute sinusitis, unspecified: Secondary | ICD-10-CM

## 2017-07-12 MED ORDER — ALBUTEROL SULFATE HFA 108 (90 BASE) MCG/ACT IN AERS *I*
2.0000 | INHALATION_SPRAY | RESPIRATORY_TRACT | 5 refills | Status: DC | PRN
Start: 2017-07-12 — End: 2018-01-16

## 2017-07-12 MED ORDER — VENLAFAXINE HCL 150 MG PO CP24 *I*
150.0000 mg | ORAL_CAPSULE | Freq: Every day | ORAL | 1 refills | Status: DC
Start: 2017-07-12 — End: 2017-09-24

## 2017-07-12 MED ORDER — DIVALPROEX SODIUM 500 MG PO TB24 *I*
2000.0000 mg | ORAL_TABLET | Freq: Every day | ORAL | 1 refills | Status: DC
Start: 2017-07-12 — End: 2017-09-24

## 2017-07-12 MED ORDER — QUETIAPINE FUMARATE 300 MG PO TABS *I*
600.0000 mg | ORAL_TABLET | Freq: Every evening | ORAL | 1 refills | Status: DC
Start: 2017-07-12 — End: 2017-09-24

## 2017-07-12 MED ORDER — GENERIC DME *A*
0 refills | Status: AC
Start: 2017-07-12 — End: ?

## 2017-07-12 MED ORDER — AMOXICILLIN-POT CLAVULANATE 875-125 MG PO TABS *I*
1.0000 | ORAL_TABLET | Freq: Two times a day (BID) | ORAL | 0 refills | Status: DC
Start: 2017-07-12 — End: 2019-11-08

## 2017-07-12 MED ORDER — QUETIAPINE FUMARATE 100 MG PO TABS *I*
100.0000 mg | ORAL_TABLET | Freq: Every day | ORAL | 1 refills | Status: DC
Start: 2017-07-12 — End: 2017-09-24

## 2017-07-12 MED ORDER — FLUTICASONE-SALMETEROL 232-14 MCG/ACT IN AEPB *A*
1.0000 | INHALATION_SPRAY | Freq: Two times a day (BID) | RESPIRATORY_TRACT | 2 refills | Status: DC
Start: 2017-07-12 — End: 2018-01-16

## 2017-07-12 NOTE — Progress Notes (Signed)
~  [facility]~    SUBJECTIVE       Illness  --sx present for 10 days  --fatigue   --congestion  --eating less  --feels horrible  --dry cough  --sinus congestion/pressure  --no fevers    Snoring  --unable to get CPAP equipment -- filters tubing  --has CPAP machine  --uses [facility]    COPD  --still smoking -- 1/2 ppd  --breathing better with controller med    Thyroid  --on levothyroxine   --reports adherences    HTN  --intermittent CP, difficult to characterize -- better than previous  --has stress tests  --no current CP, no SOB  --off of antihypertensives       Medications Reviewed    OBJECTIVE     BP 130/84   Pulse 105   Temp 36.3 C (97.3 F) (Temporal)    Ht 1.676 m (5\' 6" )   Wt 105.6 kg (232 lb 14.4 oz)   SpO2 97%   BMI 37.59 kg/m2  General Appearance: No distress  Eyes:  PEARL, EOM intact   Conjunctiva not injected  Ears, Nose, Mouth:  Oral mucosa moist, no apparent lesions, Nasal mucosa pink   COR: RRR, No Murmurs   Resp:  Respirations unlabored. Clear to auscultation bilaterally  ABD:    Normal bowel sounds, Abdomen soft, No guarding or rebound.  No HSM.  Neuro:    A+O x 3. PEARL.  EOM intact.  CN intact.  5/5 strength bilaterally.  2+DTRs.  Nl gait.          DIAGNOSIS, ORDERS AND PLAN     Acute sinusitis  -     amoxicillin-clavulanate (AUGMENTIN) 875-125 MG per tablet; Take 1 tablet by mouth 2 times daily    OSA on CPAP  -     generic DME; CPAP supplies -- mask, tubing, filter  Use as directed.  G47.33    Uncomplicated asthma  -     albuterol HFA (VENTOLIN HFA) 108 (90 Base) MCG/ACT inhaler; Inhale 2 puffs into the lungs every 4 hours as needed for Wheezing   Shake well before each use.    Gastroesophageal reflux disease without esophagitis  -     fluticasone-salmeterol (AIRDUO RESPICLICK) 232-14 MCG/ACT inhaler; Inhale 1 puff into the lungs 2 times daily    Other orders  -     quetiapine (SEROQUEL) 300 MG tablet; Take 2 tablets (600 mg total) by mouth nightly  -     divalproex (DEPAKOTE ER) 500 MG 24  hr tablet; Take 4 tablets (2,000 mg total) by mouth daily  -     venlafaxine (EFFEXOR-XR) 150 MG 24 hr capsule; Take 1 capsule (150 mg total) by mouth daily  -     QUEtiapine (SEROQUEL) 100 MG tablet; Take 1 tablet (100 mg total) by mouth daily    --Patient instructed to call if symptoms are not improving or worsening  --Follow-up arranged    Signed: Madolyn Frieze, MD on 07/14/2017 at 10:01 AM  [facility]

## 2017-07-12 NOTE — Telephone Encounter (Signed)
[facility] calling  Unfortunately insurance will not cover the cost of new CPAP supplies due to non compliance  Since May pt is at a 1% compliance  Pt would need to pay out of pocket and be 80% compliant before insurance agrees to cover costs  Info for compliance obtained through [employer] will contact the pt with info    Please advise

## 2017-07-12 NOTE — Telephone Encounter (Signed)
The issue is that he cannot be compliant with this CPAP because he does not have a mask or tubing.    What is the resolution for someone who does not have tubing?

## 2017-07-14 ENCOUNTER — Telehealth: Payer: Self-pay | Admitting: Primary Care

## 2017-07-14 NOTE — Telephone Encounter (Signed)
I tried [facility], structural but they wanted a procedure code for his cpap.   I dont have this information.   Not sure if there is a better way of doing this to try to get him this tubing.

## 2017-07-14 NOTE — Telephone Encounter (Signed)
I called [facility].   They said this is an insurance issue and that because patient was non compliant as far back as February insurance will not approve.     Tried calling [facility] but provider phone lines closed until 1. Will try again later.

## 2017-07-14 NOTE — Telephone Encounter (Signed)
Pt called to notify that he went to [facility] to pick up tube mask and filter  [facility] representative told pt that he never used the machine before  Pt upset and request another place recommendation  Pt having problems to breath at nights    Please advise

## 2017-07-17 NOTE — Telephone Encounter (Signed)
The applicable code is -- G47.33  Thanks

## 2017-08-17 ENCOUNTER — Ambulatory Visit: Payer: Medicaid Other | Admitting: Primary Care

## 2017-09-19 ENCOUNTER — Ambulatory Visit: Payer: Medicaid Other | Admitting: Primary Care

## 2017-09-24 ENCOUNTER — Other Ambulatory Visit: Payer: Self-pay | Admitting: Primary Care

## 2017-10-26 ENCOUNTER — Other Ambulatory Visit: Payer: Self-pay | Admitting: Primary Care

## 2017-10-26 DIAGNOSIS — F319 Bipolar disorder, unspecified: Secondary | ICD-10-CM

## 2017-10-26 DIAGNOSIS — E039 Hypothyroidism, unspecified: Secondary | ICD-10-CM

## 2017-12-14 ENCOUNTER — Ambulatory Visit: Payer: Medicaid Other | Admitting: Primary Care

## 2018-01-16 ENCOUNTER — Ambulatory Visit: Payer: Medicaid Other | Admitting: Primary Care

## 2018-01-16 ENCOUNTER — Other Ambulatory Visit: Payer: Self-pay | Admitting: Primary Care

## 2018-01-16 DIAGNOSIS — K219 Gastro-esophageal reflux disease without esophagitis: Secondary | ICD-10-CM

## 2018-01-16 DIAGNOSIS — E039 Hypothyroidism, unspecified: Secondary | ICD-10-CM

## 2018-01-16 DIAGNOSIS — M545 Low back pain, unspecified: Secondary | ICD-10-CM

## 2018-01-16 DIAGNOSIS — J45909 Unspecified asthma, uncomplicated: Secondary | ICD-10-CM

## 2018-01-16 DIAGNOSIS — J019 Acute sinusitis, unspecified: Secondary | ICD-10-CM

## 2018-01-16 DIAGNOSIS — F319 Bipolar disorder, unspecified: Secondary | ICD-10-CM

## 2018-01-16 MED ORDER — BISACODYL EC 5 MG PO TBEC *I*
DELAYED_RELEASE_TABLET | ORAL | 0 refills | Status: AC
Start: 2018-01-16 — End: ?

## 2018-01-16 MED ORDER — QUETIAPINE FUMARATE 100 MG PO TABS *I*
100.0000 mg | ORAL_TABLET | Freq: Every day | ORAL | 1 refills | Status: DC
Start: 2018-01-16 — End: 2018-03-01

## 2018-01-16 MED ORDER — LEVOTHYROXINE SODIUM 75 MCG PO TABS *I*
75.0000 ug | ORAL_TABLET | Freq: Every day | ORAL | 1 refills | Status: DC
Start: 2018-01-16 — End: 2018-03-01

## 2018-01-16 MED ORDER — GABAPENTIN 600 MG PO TABLET *I*
600.0000 mg | ORAL_TABLET | Freq: Three times a day (TID) | ORAL | 5 refills | Status: DC
Start: 2018-01-16 — End: 2018-03-01

## 2018-01-16 MED ORDER — ALBUTEROL SULFATE HFA 108 (90 BASE) MCG/ACT IN AERS *I*
2.0000 | INHALATION_SPRAY | RESPIRATORY_TRACT | 5 refills | Status: DC | PRN
Start: 2018-01-16 — End: 2018-03-01

## 2018-01-16 MED ORDER — DIVALPROEX SODIUM 500 MG PO TB24 *I*
2000.0000 mg | ORAL_TABLET | Freq: Every day | ORAL | 1 refills | Status: DC
Start: 2018-01-16 — End: 2018-03-01

## 2018-01-16 MED ORDER — VENLAFAXINE HCL 150 MG PO CP24 *I*
150.0000 mg | ORAL_CAPSULE | Freq: Every day | ORAL | 1 refills | Status: DC
Start: 2018-01-16 — End: 2018-03-01

## 2018-01-16 MED ORDER — POLYETHYLENE GLYCOL 3350 PO POWD *I*
ORAL | 0 refills | Status: AC
Start: 2018-01-16 — End: ?

## 2018-01-16 MED ORDER — OMEPRAZOLE 20 MG PO CPDR *I*
20.0000 mg | DELAYED_RELEASE_CAPSULE | Freq: Every day | ORAL | 5 refills | Status: DC
Start: 2018-01-16 — End: 2018-03-01

## 2018-01-16 MED ORDER — QUETIAPINE FUMARATE 300 MG PO TABS *I*
300.0000 mg | ORAL_TABLET | Freq: Every evening | ORAL | 1 refills | Status: DC
Start: 2018-01-16 — End: 2018-01-17

## 2018-01-16 MED ORDER — FLUTICASONE-SALMETEROL 232-14 MCG/ACT IN AEPB *A*
1.0000 | INHALATION_SPRAY | Freq: Two times a day (BID) | RESPIRATORY_TRACT | 2 refills | Status: DC
Start: 2018-01-16 — End: 2018-03-01

## 2018-01-16 NOTE — Telephone Encounter (Signed)
Pharmacy called and stated the polyethylene glycol only comes in the 238 grams wanted to know if that is sufficient for the patients prep.

## 2018-01-17 ENCOUNTER — Other Ambulatory Visit: Payer: Self-pay | Admitting: Primary Care

## 2018-01-17 MED ORDER — QUETIAPINE FUMARATE 300 MG PO TABS *I*
300.0000 mg | ORAL_TABLET | Freq: Every evening | ORAL | 1 refills | Status: DC
Start: 2018-01-17 — End: 2018-01-26

## 2018-01-17 NOTE — Telephone Encounter (Signed)
Patient states he needs 30 more tables because he is supposed to take 2 at night and the directions are one nighlty

## 2018-01-24 ENCOUNTER — Telehealth: Payer: Self-pay | Admitting: Primary Care

## 2018-01-24 NOTE — Telephone Encounter (Signed)
Patient is calling asking for Dr Jonny RuizFortuna to correct a script that was sent the the pharmacy for Seroquel 300mg .    He states the script was written as take 1 pill nightly, with 60 pills.  He was only able to get 30 pills, pharmacy was going to send us a fax. I do not see that we received anything from pharmacy.    But Patient states the script should have read take 2 pills nightly. With 60 pills.    Patient only has about 10 days worth of pills from the 30 days script he got,  because he has been taking two at night.

## 2018-01-26 MED ORDER — QUETIAPINE FUMARATE 300 MG PO TABS *I*
600.0000 mg | ORAL_TABLET | Freq: Every evening | ORAL | 1 refills | Status: DC
Start: 2018-01-26 — End: 2018-03-01

## 2018-01-26 NOTE — Telephone Encounter (Signed)
Left message for pt. Pt has appt coming up 4/9 with Dr Jonny RuizFortuna.

## 2018-01-26 NOTE — Telephone Encounter (Signed)
rx sent.  Please make sure he has an upcoming appt

## 2018-02-27 ENCOUNTER — Encounter: Payer: Self-pay | Admitting: Primary Care

## 2018-02-27 ENCOUNTER — Ambulatory Visit: Payer: Medicaid Other | Attending: Primary Care | Admitting: Primary Care

## 2018-02-27 VITALS — BP 104/74 | HR 100 | Ht 66.14 in | Wt 227.0 lb

## 2018-02-27 DIAGNOSIS — Z9989 Dependence on other enabling machines and devices: Secondary | ICD-10-CM

## 2018-02-27 DIAGNOSIS — E669 Obesity, unspecified: Secondary | ICD-10-CM

## 2018-02-27 DIAGNOSIS — G8929 Other chronic pain: Secondary | ICD-10-CM

## 2018-02-27 DIAGNOSIS — E039 Hypothyroidism, unspecified: Secondary | ICD-10-CM

## 2018-02-27 DIAGNOSIS — I10 Essential (primary) hypertension: Secondary | ICD-10-CM

## 2018-02-27 DIAGNOSIS — G4733 Obstructive sleep apnea (adult) (pediatric): Secondary | ICD-10-CM

## 2018-02-27 DIAGNOSIS — R202 Paresthesia of skin: Secondary | ICD-10-CM

## 2018-02-27 NOTE — Progress Notes (Signed)
~  [facility]~    SUBJECTIVE     Chronic neck pain  --present for 6 year  --predominantly on rt side  --worse with turning head to left  --reports radiation to arm, numbness in rt arm  --worse since August    Snoring  --unable to get CPAP equipment -- filters tubing  --has CPAP machine  --Still not using CPAP  --uses [facility]    COPD  --still smoking -- 12-13 cig/day  --reports adherence to controller med  --using albuteorl    Thyroid  --on levothyroxine   --reports adherences    HTN  --no current CP, no SOB  --off of antihypertensives     OSA  --not using CPAP consistently    Mood  --stable   --reports adherence to medications      Medications Reviewed    OBJECTIVE     BP 104/74    Pulse 100    Ht 1.68 m (5' 6.14")    Wt 103 kg (227 lb)    BMI 36.48 kg/m   General Appearance: No distress         DIAGNOSIS, ORDERS AND PLAN     paresthesias-recommend EMG to further evaluate paresthesias in right arm.    Right neck pain in part related to right trapezius strain.  Will check EMG to investigate further.    Stressed need for CPAP    Hypertension well controlled.  Continue current regimen.  Check labs as below      OSA on CPAP  -     EMG; Future    Chronic pain    Paresthesias    Hypothyroidism  -     Comprehensive metabolic panel; Future  -     TSH; Future  -     CBC and differential; Future    HTN (hypertension)  -     Comprehensive metabolic panel; Future    Obesity, unspecified  -     Hemoglobin A1c; Future      --Patient instructed to call if symptoms are not improving or worsening  --Follow-up arranged    Signed: Madolyn FriezeOBERT J Stepen Prins, MD on 02/27/2018 at 2:00 PM  [facility]

## 2018-03-01 ENCOUNTER — Other Ambulatory Visit: Payer: Self-pay | Admitting: Primary Care

## 2018-03-01 ENCOUNTER — Telehealth: Payer: Self-pay

## 2018-03-01 DIAGNOSIS — F319 Bipolar disorder, unspecified: Secondary | ICD-10-CM

## 2018-03-01 DIAGNOSIS — J45909 Unspecified asthma, uncomplicated: Secondary | ICD-10-CM

## 2018-03-01 DIAGNOSIS — K219 Gastro-esophageal reflux disease without esophagitis: Secondary | ICD-10-CM

## 2018-03-01 DIAGNOSIS — M545 Low back pain, unspecified: Secondary | ICD-10-CM

## 2018-03-01 DIAGNOSIS — E039 Hypothyroidism, unspecified: Secondary | ICD-10-CM

## 2018-03-01 NOTE — Telephone Encounter (Signed)
Left patient a message to call the office and schedule a EMG/Consult paresthesias

## 2018-03-02 MED ORDER — QUETIAPINE FUMARATE 100 MG PO TABS *I*
100.0000 mg | ORAL_TABLET | Freq: Every day | ORAL | 1 refills | Status: DC
Start: 2018-03-02 — End: 2018-07-02

## 2018-03-02 MED ORDER — ALBUTEROL SULFATE HFA 108 (90 BASE) MCG/ACT IN AERS *I*
2.0000 | INHALATION_SPRAY | RESPIRATORY_TRACT | 5 refills | Status: DC | PRN
Start: 2018-03-02 — End: 2019-01-09

## 2018-03-02 MED ORDER — OMEPRAZOLE 20 MG PO CPDR *I*
20.0000 mg | DELAYED_RELEASE_CAPSULE | Freq: Every day | ORAL | 5 refills | Status: DC
Start: 2018-03-02 — End: 2019-02-26

## 2018-03-02 MED ORDER — DIVALPROEX SODIUM 500 MG PO TB24 *I*
2000.0000 mg | ORAL_TABLET | Freq: Every day | ORAL | 1 refills | Status: DC
Start: 2018-03-02 — End: 2018-07-02

## 2018-03-02 MED ORDER — QUETIAPINE FUMARATE 300 MG PO TABS *I*
600.0000 mg | ORAL_TABLET | Freq: Every evening | ORAL | 1 refills | Status: DC
Start: 2018-03-02 — End: 2018-07-10

## 2018-03-02 MED ORDER — CYCLOBENZAPRINE HCL 10 MG PO TABS *I*
10.0000 mg | ORAL_TABLET | Freq: Three times a day (TID) | ORAL | 0 refills | Status: DC | PRN
Start: 2018-03-02 — End: 2018-04-25

## 2018-03-02 MED ORDER — LEVOTHYROXINE SODIUM 75 MCG PO TABS *I*
75.0000 ug | ORAL_TABLET | Freq: Every day | ORAL | 1 refills | Status: DC
Start: 2018-03-02 — End: 2018-07-02

## 2018-03-02 MED ORDER — VENLAFAXINE HCL 150 MG PO CP24 *I*
150.0000 mg | ORAL_CAPSULE | Freq: Every day | ORAL | 1 refills | Status: DC
Start: 2018-03-02 — End: 2018-07-02

## 2018-03-02 MED ORDER — GABAPENTIN 600 MG PO TABLET *I*
600.0000 mg | ORAL_TABLET | Freq: Three times a day (TID) | ORAL | 5 refills | Status: DC
Start: 2018-03-02 — End: 2019-02-26

## 2018-03-02 MED ORDER — FLUTICASONE-SALMETEROL 232-14 MCG/ACT IN AEPB *A*
1.0000 | INHALATION_SPRAY | Freq: Two times a day (BID) | RESPIRATORY_TRACT | 2 refills | Status: DC
Start: 2018-03-02 — End: 2018-08-17

## 2018-03-27 ENCOUNTER — Ambulatory Visit: Payer: Medicaid Other | Admitting: Primary Care

## 2018-04-24 ENCOUNTER — Ambulatory Visit: Payer: Medicaid Other

## 2018-04-25 ENCOUNTER — Other Ambulatory Visit: Payer: Self-pay | Admitting: Primary Care

## 2018-06-12 ENCOUNTER — Ambulatory Visit: Payer: Medicaid Other

## 2018-07-02 ENCOUNTER — Other Ambulatory Visit: Payer: Self-pay | Admitting: Primary Care

## 2018-07-02 DIAGNOSIS — E039 Hypothyroidism, unspecified: Secondary | ICD-10-CM

## 2018-07-02 DIAGNOSIS — F319 Bipolar disorder, unspecified: Secondary | ICD-10-CM

## 2018-07-10 ENCOUNTER — Telehealth: Payer: Self-pay | Admitting: Primary Care

## 2018-07-10 MED ORDER — QUETIAPINE FUMARATE 300 MG PO TABS *I*
600.0000 mg | ORAL_TABLET | Freq: Every evening | ORAL | 1 refills | Status: DC
Start: 2018-07-10 — End: 2019-04-22

## 2018-07-10 NOTE — Addendum Note (Signed)
Addended by: Madolyn FriezeFORTUNA, Nico Rogness J on: 07/10/2018 02:50 PM     Modules accepted: Orders

## 2018-07-10 NOTE — Telephone Encounter (Signed)
I called in the night time dose as well.  Please schedule appt

## 2018-07-10 NOTE — Telephone Encounter (Signed)
Patient called stating he has taken Seroquel for years and the most recent prescription states take one pill daily and he takes 2 pills daily , asking why this is?  Patient can be reach at- [phone]

## 2018-08-17 ENCOUNTER — Other Ambulatory Visit: Payer: Self-pay | Admitting: Primary Care

## 2018-08-17 DIAGNOSIS — K219 Gastro-esophageal reflux disease without esophagitis: Secondary | ICD-10-CM

## 2018-08-27 ENCOUNTER — Ambulatory Visit: Payer: Medicare Other

## 2018-08-28 ENCOUNTER — Ambulatory Visit: Payer: MEDICAID | Admitting: Primary Care

## 2018-10-12 ENCOUNTER — Other Ambulatory Visit: Payer: Self-pay | Admitting: Primary Care

## 2018-10-12 ENCOUNTER — Ambulatory Visit: Payer: Medicare Other | Admitting: Primary Care

## 2018-10-12 DIAGNOSIS — E039 Hypothyroidism, unspecified: Secondary | ICD-10-CM

## 2018-10-12 DIAGNOSIS — F319 Bipolar disorder, unspecified: Secondary | ICD-10-CM

## 2018-10-17 ENCOUNTER — Other Ambulatory Visit: Payer: Self-pay | Admitting: Primary Care

## 2018-11-02 ENCOUNTER — Other Ambulatory Visit: Payer: Self-pay | Admitting: Primary Care

## 2018-11-06 ENCOUNTER — Ambulatory Visit: Payer: Medicare (Managed Care) | Admitting: Primary Care

## 2018-11-08 ENCOUNTER — Encounter: Payer: Self-pay | Admitting: Primary Care

## 2018-11-29 ENCOUNTER — Ambulatory Visit: Payer: 59

## 2018-12-26 ENCOUNTER — Other Ambulatory Visit: Payer: 59 | Admitting: Primary Care

## 2018-12-26 DIAGNOSIS — F319 Bipolar disorder, unspecified: Secondary | ICD-10-CM

## 2018-12-26 DIAGNOSIS — E039 Hypothyroidism, unspecified: Secondary | ICD-10-CM

## 2018-12-27 ENCOUNTER — Other Ambulatory Visit: Payer: Self-pay

## 2018-12-27 ENCOUNTER — Ambulatory Visit: Payer: 59 | Attending: Neurology

## 2018-12-27 DIAGNOSIS — M5412 Radiculopathy, cervical region: Secondary | ICD-10-CM

## 2018-12-27 DIAGNOSIS — G4733 Obstructive sleep apnea (adult) (pediatric): Secondary | ICD-10-CM | POA: Insufficient documentation

## 2018-12-27 DIAGNOSIS — Z9989 Dependence on other enabling machines and devices: Secondary | ICD-10-CM | POA: Insufficient documentation

## 2018-12-27 NOTE — Procedures (Signed)
Exam location:  [facility] Attending physician: Angelica ChessmanPeter Tiasha Helvie, M.D.     Patient: Timothy French, Timothy French  Patient ID: [id number]      Date of Birth: 1966/02/09 Height: 5'6" Gender: Male   Report ID: [id number] Exam Date: 12/27/2018     Referring Physician(s):  Dr. Ulla Potashobert [name]   Provider(s):  Angelica ChessmanPeter Lillyian Heidt, M.D. / Dr. Ova FreshwaterGeetha Kanakeswaran / Massie MaroonShareen Marquez R. NCS. T     Referring Diagnosis:  Pain in right arm, right arm   Final Diagnosis:  Cervical Radiculopathy, Multiple     Patient History   Neuromuscular New Patient Visit Note      12/27/2018      Timothy Lavdward Kalish French is a 53 y.o. year old male who was referred by Madolyn FriezeFortuna, Robert J, MD for electrodiagnostic evaluation and focused neuromuscular consultation for right upper extremity paresthesias.       He notes a long-standing history of neck pain and had a cervical fusion surgery in 2012 for his neck pain. After the surgery he continued having the neck pain, but reports right arm numbness and tingling for the past 6 years. It is worse when turning his head to the left. The sensation travels down the back of his triceps to his whole forearm and hands. It can last for hours to days. It is exacerbated when laying down on his right side. He also reports nocturnal awakenings due to the pain. He has associated weakness in his right arm with reported loss of bulk in his triceps. He denies symptoms on his left side. He has tried flexeril and gabapentin which have helped minimally.       Review of Systems: He completed a 15 point review of systems questionnaire that I reviewed today and that will be scanned into the electronic health record.      Past Medical History: Asthma, Bipolar affective, Carpal tunnel syndrome s/p release,Depression, Headache, History of stomach ulcers, Hypertension, Sleep apnea       Past Surgical History: cervical fusion 2012, inguinal hernia repair      Family History: family history includes Cancer in his daughter; Diabetes Type I in his  father and maternal grandmother; Heart Disease in his maternal grandmother; High Blood Pressure in his father; Hypertension in an other family member; Mental illness in an other family member; No Known Problems in his mother; Other in his paternal grandfather and another family member.      Social History:  reports that he has been smoking cigarettes. He has been smoking about 1.00 pack per day for 30 years. He has never used smokeless tobacco. He drinking 12 alcoholic drinks a month. He reports that he does not use drugs.       Allergies:  Latex, Bee venom, Clonazepam, Naproxen, Tramadol      Medication: He has a current medication list which includes the following prescription(s): levothyroxine, quetiapine, cyclobenzaprine, venlafaxine, fluticasone-salmeterol, quetiapine, divalproex, gabapentin, omeprazole, albuterol hfa, bisacodyl, polyethylene glycol, levothyroxine, generic dme, amoxicillin-clavulanate, and auto-titrating cpap.      A time out procedure was completed to verify the patient's identity including first and last name, date of birth and confirmed with id band. The correct procedure and site were reviewed with the patient.  Verbal consent was obtained prior to testing.  During performance of nerve conduction studies, distal limb temperature was maintained between 32 to 36 degrees Centigrade.     Clinical Examination   Physical Exam   BP (!) 135/94    Pulse 82  Ht 1.68 m (5' 6.14")    Wt 97.1 kg (214 lb)    BMI 34.39 kg/m       General: Pleasant and sitting comfortably. NAD.      Neurologic Exam   MS: Alert and interactive. Language appears intact.      CN: VF full to finger counting. VA intact. Pupils are 4/4 to 2/2. Ocular versions full and conjugate. Pursuits are smooth. Facial sensation intact to light touch.  Able to raise eye brows symmetrically. Able to bury lashes on eye closure with symmetrical resistance to opening. Able to puff up cheeks, purse lips and whistle. Smile symmetrical with  equal activation. Hearing grossly intact. Palate elevates symmetrically. Shoulder shrug strong bilaterally. Tongue strong with lateral protrusion.  No fasciculations in tongue. No atrophy of the tongue      Motor:   Tone : normal   Muscle bulk: overall ok, possibly with slight reduction of right arm compared to left   Pronator drift: none   Involuntary movements: none   Strength (R/L):   Shoulder abduction 5/5, forward flexion 5/5, external rotation 5/5.  Elbow flexion 5/5, extension 4+/5.  Wrist flexion 5/5, extension 4+/5.  Finger flexion 5/5, extension 5/5, abduction 5/5.  Thumb abduction 5/5.  Hip flexion 5/5, abduction 5/5.  Knee flexion 5/5, extension 5/5.  Ankle dorsiflexion 5/5, plantarflexion 5/5, inversion 5/5, eversion 5/5.  Great toe extension 5/5.      Reflexes (R/L): Biceps 2+/2+, triceps 1+/2+, brachioradialis 2+/2+, knee jerk 2+/2+, ankle jerk 2+/2+.    Plantar response is flexion.  No clonus.      Sensory:   Pin Prick: Diminished throughout right upper extremity.    Light touch: Intact and symmetric in upper and lower extremities.   Vibration (R/L, in seconds)   Index Finger: 20/20   Position Sense: Intact at the great toes bilaterally      Coordination:   Finger-Nose-Finger: Intact   Heel to Shin: Intact      Gait: Stable on standing and turns. Narrow based gait with normal stride length and arm swing. A        Motor Nerve Conduction   Nerve and Stimulus Site  Onset Latency  Amplitude  Conduction Velocity  Distance  Normal    Median.R/Abductor pollicis  brevis    Wrist  3.7 ms  5.3 mV       70 mm  yes    Ulnar.R/Abductor digiti minimi    Wrist  3 ms  12.6 mV       70 mm  yes    B.Elbow  6.8 ms  12.2 mV  58.7 m/s  220 mm  yes    A.Elbow  8.6 ms  11.7 mV  54.9 m/s  100 mm  yes    Median.R/Abductor pollicis  brevis    Elbow  8.5 ms  5.2 mV  52.2 m/s  250 mm  yes        Sensory Nerve Conduction   Nerve and Stimulus Site  Segment  Onset Latency  Amplitude  Conduction Velocity  Distance  Normal     Median.R    Wrist  Wrist-Digit II  2.3 ms  23.2 uV  61.1 m/s  140 mm  yes    Mid palm  Digit II-Mid palm  0.9 ms  21.9 uV  67.8 m/s  60 mm  yes         Mid palm-Wrist            56.9  m/s  80 mm  yes    Ulnar.R    Wrist  Wrist-Digit V  2.1 ms  14.8 uV  51.5 m/s  110 mm  yes    Radial.R    Forearm  Wrist-Forearm  2 ms  26.2 uV  50.5 m/s  100 mm  yes        Needle EMG Data   Muscle S i d e Spontaneous Activity Motor Unit Morphology Interference Pattern     Insertional Activity Fibs/Pos. Waves Fascics Duration Amplitude Phases Activation Recruit ment   Deltoid  R  Normal  0  0  Normal  +1  Normal  Normal  -1    Biceps Brachii  R  Normal  0  0  Normal  +1  Normal  Normal  -1    Triceps  R  Normal  0  0  +1  +2  Normal  Normal  -2    Flexor Carpi Radialis  R  Normal  0  0  +1  +2  Normal  Normal  -2    First Dorsal Interosseous  R  Normal  0  0  +1  +1  Normal  Normal  -1    Rhomboid Major  R  Normal  0  0  Normal  Normal  Normal  Normal  Normal           Interpretation & Conclusions   Electrodiagnostic Interpretation:       This is an abnormal study.       There is electrodiagnostic evidence of chronic, right-sided polyradiculopathies best localizing to the C6 through C8 motor nerve roots.  There is no electrodiagnostic evidence of a right-sided median or ulnar neuropathy, or generalized large-fiber polyneuropathy.       Clinical Impression:   53 year old male with medical history of cervical fusion who presents with chronic neck pain with radiation of pain and paresthesias into right upper extremity. Neurologic examination reveals hyporeflexia in right triceps with some suggestion of right tricep and wrist extension weakness. In the context of his electrodiagnostic testing, his neck pain and right arm sensory symptoms and weakness are best explained by multilevel right-sided cervical radiculopathies.  For further evaluation we will order MRI imaging of his cervical spine without contrast.  Pending the MRI report  results he will likely benefit from evaluation by a spine surgeon.      As the teaching physician identified below, I was physically present for the key portions of the electrodiagnostic examination and I prepared the interpretation at the time of the examination.        Signature   This report signed electronically by    Angelica ChessmanPeter Trejon Duford, M.D. on 12/27/2018 4:54 PM       Angelica ChessmanPeter Mariadel Mruk, M.D. / Dr. Ova FreshwaterGeetha Kanakeswaran / Massie MaroonShareen Marquez R. NCS. T

## 2018-12-27 NOTE — Progress Notes (Signed)
Patient underwent an EMG/NCS with consultation in the [facility] Neuromuscular Department. Please refer to the uploaded EMG/NCS under the procedures tab for full details.    Raymondo Garcialopez, MD (Neuromusuclar Fellow)

## 2018-12-27 NOTE — Patient Instructions (Signed)
Today you were seen in at the [facility]. Please contact us if you have any concerns or letters you require.

## 2018-12-29 ENCOUNTER — Telehealth: Payer: Self-pay | Admitting: Internal Medicine

## 2018-12-29 ENCOUNTER — Encounter: Payer: Self-pay | Admitting: Gastroenterology

## 2018-12-29 DIAGNOSIS — F319 Bipolar disorder, unspecified: Secondary | ICD-10-CM

## 2018-12-29 DIAGNOSIS — E039 Hypothyroidism, unspecified: Secondary | ICD-10-CM

## 2018-12-29 NOTE — Telephone Encounter (Signed)
~  [facility] [employer]    Phone Call -- Renette Butters After Hours Triage     Date of call: 12/29/2018  Time of call: 10:09 AM    Timothy French 1966/10/30 called reporting:   Asking for refill of levothyroxinie      Impression     Medication refill    Disposition / Plan      Patient instructed to:  1.  Asked to call clinic on Monday for long-term refill. Will not fill over the weekend.   2. Instructed to call back if symptoms worsen, change or progress.      Author: Cliffton Asters, MD as of 12/29/2018  at 10:09 AM

## 2018-12-31 ENCOUNTER — Telehealth: Payer: Self-pay

## 2018-12-31 ENCOUNTER — Other Ambulatory Visit: Payer: Self-pay | Admitting: Primary Care

## 2018-12-31 MED ORDER — LEVOTHYROXINE SODIUM 75 MCG PO TABS *I*
75.0000 ug | ORAL_TABLET | Freq: Every day | ORAL | 3 refills | Status: DC
Start: 2018-12-31 — End: 2019-02-22

## 2018-12-31 NOTE — Telephone Encounter (Signed)
Patient needs a refill on Synthroid 75 Mg, Pharmacy said doctor denied medication please follow up with patient he is out of this medication

## 2018-12-31 NOTE — Telephone Encounter (Signed)
rx called into pharmacy

## 2018-12-31 NOTE — Telephone Encounter (Signed)
Called left message on voice mail MRI Cervical Spine W/O contrast with [facility]  On 01/09/19 @ 5:00pm. Sent reminder Letter to patient.

## 2019-01-09 ENCOUNTER — Ambulatory Visit: Payer: 59

## 2019-01-09 ENCOUNTER — Other Ambulatory Visit: Payer: Self-pay | Admitting: Primary Care

## 2019-01-09 MED ORDER — ALBUTEROL SULFATE HFA 108 (90 BASE) MCG/ACT IN AERS *I*
INHALATION_SPRAY | RESPIRATORY_TRACT | 5 refills | Status: DC
Start: 2019-01-09 — End: 2020-01-10

## 2019-01-11 ENCOUNTER — Telehealth: Payer: Self-pay | Admitting: Primary Care

## 2019-01-11 NOTE — Telephone Encounter (Signed)
Type of form: C[employer] Plasma     Received by fax, mail, or dropped off: dropped off     What was done with form: Form put in PCP bin     Instructions (fax/mail/pick up/etc.): fax [phone]

## 2019-01-15 NOTE — Telephone Encounter (Signed)
FORMS FAXED, CONFIRMED AND SENT TO SCANNING

## 2019-01-21 ENCOUNTER — Telehealth: Payer: Self-pay

## 2019-01-21 ENCOUNTER — Ambulatory Visit: Payer: 59

## 2019-01-21 ENCOUNTER — Other Ambulatory Visit: Payer: 59

## 2019-01-21 NOTE — Telephone Encounter (Signed)
Temporary 30 day supply of Albuterol Aer HFa has been supplied by [employer].  Suggested alternative : proair HFA or ProAir Respiclick.

## 2019-01-22 MED ORDER — ALBUTEROL SULFATE 108 (90 BASE) MCG/ACT IN AEPB *I*
2.0000 | INHALATION_SPRAY | RESPIRATORY_TRACT | 11 refills | Status: DC | PRN
Start: 2019-01-22 — End: 2020-03-23

## 2019-01-22 NOTE — Addendum Note (Signed)
Addended by: Madolyn Frieze on: 01/22/2019 01:44 PM     Modules accepted: Orders

## 2019-01-22 NOTE — Telephone Encounter (Signed)
prescription sent

## 2019-01-24 ENCOUNTER — Ambulatory Visit: Payer: 59 | Admitting: Primary Care

## 2019-01-29 ENCOUNTER — Telehealth: Payer: Self-pay | Admitting: Primary Care

## 2019-01-29 NOTE — Telephone Encounter (Signed)
Called patient @ [phone]-- Phone not in service, tried wife's # [phone]-- it rings like a fax machine.    Cancelled patient appt for 02/22/19 with Dr Jonny Ruiz  ( MD request)  Could not contact patient at any numbers we have on file.    Sending a letter to ask patient to call office to reschedule

## 2019-02-05 ENCOUNTER — Ambulatory Visit: Payer: 59

## 2019-02-21 ENCOUNTER — Other Ambulatory Visit: Payer: Self-pay | Admitting: Primary Care

## 2019-02-21 DIAGNOSIS — E039 Hypothyroidism, unspecified: Secondary | ICD-10-CM

## 2019-02-21 DIAGNOSIS — F319 Bipolar disorder, unspecified: Secondary | ICD-10-CM

## 2019-02-22 ENCOUNTER — Ambulatory Visit: Payer: 59 | Admitting: Primary Care

## 2019-02-26 ENCOUNTER — Other Ambulatory Visit: Payer: Self-pay | Admitting: Primary Care

## 2019-02-26 DIAGNOSIS — K219 Gastro-esophageal reflux disease without esophagitis: Secondary | ICD-10-CM

## 2019-02-26 DIAGNOSIS — M545 Low back pain, unspecified: Secondary | ICD-10-CM

## 2019-02-26 DIAGNOSIS — F319 Bipolar disorder, unspecified: Secondary | ICD-10-CM

## 2019-02-27 MED ORDER — QUETIAPINE FUMARATE 100 MG PO TABS *I*
100.0000 mg | ORAL_TABLET | Freq: Every day | ORAL | 1 refills | Status: DC
Start: 2019-02-27 — End: 2019-10-16

## 2019-02-27 MED ORDER — GABAPENTIN 600 MG PO TABLET *I*
600.0000 mg | ORAL_TABLET | Freq: Three times a day (TID) | ORAL | 5 refills | Status: DC
Start: 2019-02-27 — End: 2020-02-07

## 2019-02-27 MED ORDER — OMEPRAZOLE 20 MG PO CPDR *I*
20.0000 mg | DELAYED_RELEASE_CAPSULE | Freq: Every day | ORAL | 5 refills | Status: DC
Start: 2019-02-27 — End: 2020-03-18

## 2019-02-27 MED ORDER — FLUTICASONE-SALMETEROL 232-14 MCG/ACT IN AEPB *A*
1.0000 | INHALATION_SPRAY | Freq: Two times a day (BID) | RESPIRATORY_TRACT | 2 refills | Status: DC
Start: 2019-02-27 — End: 2020-02-11

## 2019-04-20 ENCOUNTER — Other Ambulatory Visit: Payer: Self-pay | Admitting: Primary Care

## 2019-06-24 ENCOUNTER — Other Ambulatory Visit: Payer: Self-pay | Admitting: Primary Care

## 2019-07-20 ENCOUNTER — Other Ambulatory Visit: Payer: Self-pay | Admitting: Primary Care

## 2019-08-01 ENCOUNTER — Telehealth: Payer: Self-pay

## 2019-08-01 ENCOUNTER — Encounter: Payer: Self-pay | Admitting: Primary Care

## 2019-08-01 ENCOUNTER — Telehealth: Payer: Self-pay | Admitting: Primary Care

## 2019-08-01 ENCOUNTER — Ambulatory Visit: Payer: 59 | Admitting: Primary Care

## 2019-08-01 ENCOUNTER — Other Ambulatory Visit
Admission: RE | Admit: 2019-08-01 | Discharge: 2019-08-01 | Disposition: A | Discharge status: Home / Self Care | Payer: 59 | Source: Ambulatory Visit | Attending: Primary Care | Admitting: Primary Care

## 2019-08-01 VITALS — BP 110/78 | HR 80 | Ht 66.0 in | Wt 212.0 lb

## 2019-08-01 DIAGNOSIS — F319 Bipolar disorder, unspecified: Secondary | ICD-10-CM

## 2019-08-01 DIAGNOSIS — E039 Hypothyroidism, unspecified: Secondary | ICD-10-CM

## 2019-08-01 DIAGNOSIS — I10 Essential (primary) hypertension: Secondary | ICD-10-CM | POA: Insufficient documentation

## 2019-08-01 DIAGNOSIS — R002 Palpitations: Secondary | ICD-10-CM

## 2019-08-01 DIAGNOSIS — Z23 Encounter for immunization: Secondary | ICD-10-CM

## 2019-08-01 LAB — CBC AND DIFFERENTIAL
Baso # K/uL: 0.1 10*3/uL (ref 0.0–0.1)
Basophil %: 1 %
Eos # K/uL: 0.1 10*3/uL (ref 0.0–0.5)
Eosinophil %: 0.7 %
Hematocrit: 50 % (ref 40–51)
Hemoglobin: 16.4 g/dL (ref 13.7–17.5)
IMM Granulocytes #: 0 10*3/uL (ref 0.0–0.0)
IMM Granulocytes: 0.3 %
Lymph # K/uL: 2.8 10*3/uL (ref 1.3–3.6)
Lymphocyte %: 32.6 %
MCH: 31 pg/cell (ref 26–32)
MCHC: 33 g/dL (ref 32–37)
MCV: 94 fL — ABNORMAL HIGH (ref 79–92)
Mono # K/uL: 0.6 10*3/uL (ref 0.3–0.8)
Monocyte %: 6.9 %
Neut # K/uL: 5.1 10*3/uL (ref 1.8–5.4)
Nucl RBC # K/uL: 0 10*3/uL (ref 0.0–0.0)
Nucl RBC %: 0 /100 WBC (ref 0.0–0.2)
Platelets: 250 10*3/uL (ref 150–330)
RBC: 5.3 MIL/uL (ref 4.6–6.1)
RDW: 13.2 % (ref 11.6–14.4)
Seg Neut %: 58.5 %
WBC: 8.7 10*3/uL (ref 4.2–9.1)

## 2019-08-01 LAB — COMPREHENSIVE METABOLIC PANEL
ALT: 33 U/L (ref 0–50)
AST: 19 U/L (ref 0–50)
Albumin: 5 g/dL (ref 3.5–5.2)
Alk Phos: 79 U/L (ref 40–130)
Anion Gap: 10 (ref 7–16)
Bilirubin,Total: 0.3 mg/dL (ref 0.0–1.2)
CO2: 27 mmol/L (ref 20–28)
Calcium: 10.5 mg/dL — ABNORMAL HIGH (ref 8.6–10.2)
Chloride: 99 mmol/L (ref 96–108)
Creatinine: 1.09 mg/dL (ref 0.67–1.17)
GFR,Black: 89 *
GFR,Caucasian: 77 *
Glucose: 95 mg/dL (ref 60–99)
Lab: 14 mg/dL (ref 6–20)
Potassium: 5.7 mmol/L — ABNORMAL HIGH (ref 3.3–5.1)
Sodium: 136 mmol/L (ref 133–145)
Total Protein: 8.5 g/dL — ABNORMAL HIGH (ref 6.3–7.7)

## 2019-08-01 LAB — TSH: TSH: 2.55 u[IU]/mL (ref 0.27–4.20)

## 2019-08-01 LAB — LIPID PANEL
Chol/HDL Ratio: 3.6
Cholesterol: 204 mg/dL — AB
HDL: 56 mg/dL (ref 40–60)
LDL Calculated: 127 mg/dL
Non HDL Cholesterol: 148 mg/dL
Triglycerides: 105 mg/dL

## 2019-08-01 MED ORDER — VARENICLINE TARTRATE 0.5 MG X 11 & 1 MG X 42 PO MISC *A*
ORAL | 0 refills | Status: AC
Start: 2019-08-01 — End: ?

## 2019-08-01 NOTE — Patient Instructions (Signed)
Thank you for completing your Subsequent Annual Medicare Visit and Follow-up (Discuss re ordering MRI )   with [place] today.     The purpose of this visits was to:     Screen for disease   Assess risk of future medical problems   Help develop a healthy lifestyle   Update vaccines   Get to know your doctor in case of an illness    Patient Care Team:  Renaldo Reel, MD as PCP - General  Gilford Rile Marily Lente, MD as PCP - [facility]  Katherine Roan, MD (Bariatric Surgery)  Reymundo Poll, [facility])     Medicare 5 Year Plan    The following items were identified as areas of concern during your screening today:  Smoking- This is a risk factor for many kids of Cancer, Heart Attack, Stroke, Kidney Problems, Eye Problems, Asthma, COPD, and can cause an overall decrease in energy.   BMI greater than 25 - This is a risk for Heart Attack, Stroke, High Blood Pressure, Diabetes, High Cholesterol and other complications.       The Health Maintenance table below identifies screening tests and immunizations recommended by your health care team:  Health Maintenance   Topic Date Due    HIV Screening  08/01/1979    Hepatitis C Screening  07/31/1984    Shingles Vaccine (1 of 2) 07/31/2016    DEPRESSION SCREEN MONTHLY  08/12/2017    Flu Shot (1) 07/23/2019    Colon Cancer Screening  10/31/2019     In addition, goals and orders placed to address these recommendations are listed in the "Today's Visit" section.    We wish you the best of health and look forward to seeing you again next year for your Annual Medicare Wellness Visit.     If you have any health care concerns before then, please do not hesitate to contact us.

## 2019-08-01 NOTE — Telephone Encounter (Signed)
LM FOR PT TO CALL AND SCHED 48 HR HOLTER

## 2019-08-01 NOTE — Progress Notes (Signed)
Due to pandemic event, telehome visit preformed via:           Office Visit, met with patient in person    Today we reviewed and updated Timothy French smoking status, activities of daily living, depression screen, fall risk, medications and allergies.   I have counseled the patient in the above areas.     Subjective:     Chief Complaint: Timothy French is a 53 y.o. male here for a/an Subsequent Annual Medicare Visit and Follow-up (Discuss re ordering MRI )    In general, Virgilio Belling rates their overall health as:  good      Patient Care Team:  Renaldo Reel, MD as PCP - General  Gilford Rile Marily Lente, MD as PCP - [facility]  Katherine Roan, MD (Bariatric Surgery)  Reymundo Poll, [place] [facility])     Current Outpatient Medications on File Prior to Visit   Medication Sig Dispense Refill    cyclobenzaprine (FLEXERIL) 10 MG tablet TAKE 1 TABLET BY MOUTH THREE TIMES DAILY AS NEEDED FOR MUSCLE SPASMS 60 tablet 0    divalproex (DEPAKOTE ER) 500 MG 24 hr tablet TAKE 4 TABLETS BY MOUTH EVERY DAY 120 tablet 1    venlafaxine (EFFEXOR-XR) 150 MG 24 hr capsule TAKE 1 CAPSULE BY MOUTH DAILY 30 capsule 1    QUEtiapine (SEROQUEL) 300 MG tablet TAKE 2 TABLETS BY MOUTH NIGHTLY. MAXIMUM DAILY DOSE IS 2 TABLETS 180 tablet 3    fluticasone-salmeterol (AIRDUO RESPICLICK) 875-64 MCG/ACT inhaler Inhale 1 puff into the lungs 2 times daily 1 Inhaler 2    QUEtiapine (SEROQUEL) 100 MG tablet Take 1 tablet (100 mg total) by mouth daily 30 tablet 1    omeprazole (PRILOSEC) 20 MG capsule Take 1 capsule (20 mg total) by mouth daily (before breakfast) 30 capsule 5    gabapentin (GABAPENTIN) 600 MG tablet Take 1 tablet (600 mg total) by mouth 3 times daily 90 tablet 5    levothyroxine (SYNTHROID, LEVOTHROID) 75 MCG tablet TAKE 1 TABLET BY MOUTH DAILY 30 tablet 3    Albuterol Sulfate (PROAIR RESPICLICK) 332 (90 Base) MCG/ACT AEPB Inhale 2 puffs into the lungs every 4-6 hours as needed 1 each 11    albuterol  HFA (PROVENTIL, VENTOLIN, PROAIR HFA) 108 (90 Base) MCG/ACT inhaler INHALE 2 PUFFS EVERY 4 HOURS IF NEEDED 1 each 5    divalproex (DEPAKOTE ER) 500 MG 24 hr tablet TAKE 4 TABLETS BY MOUTH ONCE DAILY 120 tablet 1    bisacodyl (BISACODYL) 5 MG EC tablet Follow colonoscopy instructions 4 tablet 0    polyethylene glycol (GLYCOLAX) powder Follow colonoscopy prep instructions 255 g 0    levothyroxine (SYNTHROID, LEVOTHROID) 75 MCG tablet take 1 tablet by mouth once daily 30 tablet 1    generic DME CPAP supplies -- mask, tubing, filter  Use as directed.  G47.33 1 each 0    amoxicillin-clavulanate (AUGMENTIN) 875-125 MG per tablet Take 1 tablet by mouth 2 times daily 20 tablet 0    auto-titrating CPAP (AUTOSET) machine Use as directed.  Pressure 5-15 mmH2O.  Include tubing and mask.   Dx -- G47.33 1 each 0     No current facility-administered medications on file prior to visit.      Allergies   Allergen Reactions    Latex Rash    Bee Venom Swelling    Clonazepam Other (See Comments)     Bradycardia    Naproxen Rash    Tramadol Rash  No Known Drug Allergy      Created by Conversion - 0;      Patient Active Problem List    Diagnosis Date Noted    Cervical radiculopathy 12/27/2018    OSA on CPAP 03/16/2017    Aftercare following surgery for injury and trauma 18/84/1660    S/P umbilical hernia repair 04/23/00 05/25/2016    Hypertension 05/14/2014    Asthma 05/14/2014    Bipolar affective 05/14/2014    ACP Staging Stage 1 Hypertension: 140-159 / 90-99 08/20/2010     Created by Conversion        Carpal Tunnel Syndrome 03/18/2010     Created by Conversion        Backache 08/06/2009     Created by Conversion        Joint Pain, Localized In The Hip 04/02/2009     Created by Conversion        Cervicalgia 03/13/2009     Created by Conversion        Difficulty Breathing (Dyspnea) 03/13/2009     Created by Conversion        Esophageal Reflux 02/11/2009     Created by Conversion        Nicotine Dependence  02/11/2009     Created by Conversion        Chronic Pain 12/08/2008     Created by Conversion        Cervical Vertebral Fusion 11/03/2008     Created by Conversion        Asthma 11/28/2007     Created by Conversion        Hypothyroidism 11/28/2007     Created by Conversion        Bipolar Disorder 11/28/2007     Created by Conversion         Past Medical History:   Diagnosis Date    Asthma     Bipolar affective     Carpal tunnel syndrome     Depression     Headache     History of stomach ulcers     Hypertension     Machine dependence     Sleep apnea      Past Surgical History:   Procedure Laterality Date    BACK SURGERY      cervical fusion    HERNIA REPAIR      inguinal    NECK SURGERY  2012     Family History   Problem Relation Age of Onset    Other Paternal Grandfather         mental illness    Hypertension Other     Mental illness Other     Other Other         thyroid    No Known Problems Mother     Diabetes Type I Father     High Blood Pressure Father     Cancer Daughter         Brain    Diabetes Type I Maternal Grandmother     Heart Disease Maternal Grandmother      Social History     Socioeconomic History    Marital status: Single     Spouse name: Not on file    Number of children: Not on file    Years of education: Not on file    Highest education level: Not on file   Occupational History    Not on file   Tobacco Use    Smoking status: Current  Every Day Smoker     Packs/day: 1.00     Types: Cigarettes    Smokeless tobacco: Never Used   Substance and Sexual Activity    Alcohol use: No     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    Vision Screening Results (Welcome visit only):  No exam data present    Depression  Screening Results:  Recent Review Flowsheet Data     PHQ Scores 08/01/2019 07/12/2017 06/08/2016    PSQ2 Q1 - Interest/Pleasure N - -    PSQ2 Q2 - Down, Depressed, Hopeless Y - -    PHQ Q9 - Better Off Dead 0 0 0    PHQ Calculated Score _1 Opioid Use/DAST- 10 Screening Results:   How many times in the past year have you used an illegal drug or used a prescription medication for nonmedical reasons?: 0 (08/01/2019  8:40 AM)    Activities of Daily Living/Functional Screening Results:  Is the person deaf or does he/she have serious difficulty hearing?: N  Is this person blind or does he/she have serious difficulty seeing even when wearing glasses?: N  *Vision Status: No impairment  Does this person have serious difficulty walking or climbing stairs?: Y  *Walks in Home: Independent  *Climbing Stairs: Independent  *Shopping: Independent  *House Keeping: Independent  *Managing Own Medications: Independent  *Handling Finances: Independent  Difficulty doing errands due to a physicial, mental or emotional condition: No  Difficulty remembering or making decisions due to a physicial, mental or emotional condition: No      Fall Risk Screening Results:  Have you fallen in the last year?: No  Do you feel you are at risk for falling?: No      Assessment and Plan:     Cognitive Function:  Recall of recent and remote events appears:  Normal      Advanced Care Planning:  was discussed and patient received paperwork to review     The following health maintenance plan was reviewed with the patient:    Health Maintenance Topics with due status: Overdue       Topic Date Due    HIV Screening USPSTF/[place] 08/01/1979    Hepatitis C Screening 07/31/1984    IMM-ZOSTER 07/31/2016    DEPRESSION SCREEN MONTHLY 08/12/2017    IMM-INFLUENZA 07/23/2019     Health Maintenance Topics with due status: Due Soon       Topic Date Due    Colon Cancer Screening Other 10/31/2019     This health maintenance schedule, identified risks, a list of orders  placed today and patient goals have been provided to Virgilio Belling in the after visit summary.     Plan for any concerns identified during screening or risk assessments:  n/a

## 2019-08-01 NOTE — Progress Notes (Signed)
Flu Immunization     Timothy French is here today for an influenza vaccine.     Influenza immunization reviewed and discussed.  Questions answered    Consent obtained.    No history of any of the following:                Severe allergy to eggs                 Severe allergy to Influenza vaccine                 History of Guillain-Barre Syndrome                Current illness or fever                No current pregnancy        Signed: Arlyss Queen, LPN on 7/61/9509 at 3:26 AM

## 2019-08-01 NOTE — Progress Notes (Signed)
~[facility]~    SUBJECTIVE     COPD / Asthma / OSA  --using albuterol several times per week  --smoking 1 ppd  --reports adherence to controller med  --not using CPAP consistently  --unable to get CPAP equipment -- filters tubing   --continue to have apneic   --using [facility]     Thyroid  --on levothyroxine -- reports adherence    HTN  --loss some wieght   --off of antihypertensives    Chest pain   --had episode of CP a few weeks ago     Mood  --stable   --reports adherence to medications    Chronic neck pain  --present for many years  --on gabapentin  --has right sided shooting pain      Health Maintenance Due   Topic Date Due    HIV Screening USPSTF/[place]  08/01/1979    Hepatitis C Screening  07/31/1984    IMM-ZOSTER (1 of 2) 07/31/2016    DEPRESSION SCREEN MONTHLY  08/12/2017    IMM-INFLUENZA (1) 07/23/2019    Colon Cancer Screening Other  10/31/2019       Medications Reviewed    OBJECTIVE     BP 110/78    Pulse 80    Ht 1.676 m (5\' 6" )    Wt 96.2 kg (212 lb)    BMI 34.22 kg/m   General Appearance: No distress  Eyes:  PEARL, EOM intact   Conjunctiva not injected  Ears, Nose, Mouth:  Oral mucosa moist, no apparent lesions, Nasal mucosa pink   COR: RRR, No Murmurs   Resp:  Respirations unlabored. Clear to auscultation bilaterally  ABD:    Normal bowel sounds, Abdomen soft, No guarding or rebound.  No HSM.     2018  Summary   Normal resting biventricular size and function.   Estimated LVEF is approximately 59%.   Mild left atrial enlargement.   No significant valvular abnormalities.   No evidence of stress-induced ECG changes or inducible arrhythmias.    Reduced exercise tolerance with appropriate augmentation of LV function.   No stress-induced regional wall motion abnormalities.    Negative study for evidence of inducible ischemia at an acceptable cardiac workload.      EEG 2020  There is electrodiagnostic evidence of chronic, right-sided polyradiculopathies best localizing to the C6  through C8 motor nerve roots.  There is no electrodiagnostic evidence of a right-sided median or ulnar neuropathy, or generalized large-fiber polyneuropathy.        DIAGNOSIS, ORDERS AND PLAN     Preventative health care    Hypothyroidism, unspecified type  -     Comprehensive metabolic panel; Future  -     Lipid Panel (Reflex to Direct  LDL if Triglycerides more than 400); Future  -     CBC and differential; Future  -     TSH; Future    Bipolar affective disorder, remission status unspecified  -     Comprehensive metabolic panel; Future  -     Lipid Panel (Reflex to Direct  LDL if Triglycerides more than 400); Future  -     CBC and differential; Future  -     TSH; Future    Hypertension, unspecified type  -     Comprehensive metabolic panel; Future  -     Lipid Panel (Reflex to Direct  LDL if Triglycerides more than 400); Future  -     CBC and differential; Future  -  TSH; Future    Need for vaccination  -     Flu vaccine quadrivalent greater than or equal to 3yo preservative free IM    Other orders  -     varenicline (CHANTIX STARTING PAK) 0.5 MG X 11 & 1 MG X 42 tablet pak; Take one 0.5mg  tablet daily for 3 days, then one 0.5mg  tablet twice daily for 4 days, then one 1mg  tablet twice daily.      --Patient instructed to call if symptoms are not improving or worsening  --Follow-up arranged    Signed: Madolyn FriezeOBERT J Graham Hyun, MD on 08/01/2019 at 9:28 AM  [facility]

## 2019-08-01 NOTE — Telephone Encounter (Signed)
Pt prefers to schedule the Holter Monitor appt.  Gave pt the phone number to [facility] [phone].

## 2019-08-02 NOTE — Telephone Encounter (Signed)
2ND ATTEMPT

## 2019-08-05 ENCOUNTER — Ambulatory Visit
Admission: RE | Admit: 2019-08-05 | Discharge: 2019-08-05 | Disposition: A | Discharge status: Home / Self Care | Payer: 59 | Source: Ambulatory Visit | Attending: Cardiology | Admitting: Cardiology

## 2019-08-05 DIAGNOSIS — R002 Palpitations: Secondary | ICD-10-CM | POA: Insufficient documentation

## 2019-08-05 NOTE — Telephone Encounter (Signed)
SCHEDULED

## 2019-08-07 ENCOUNTER — Other Ambulatory Visit: Payer: Self-pay | Admitting: Primary Care

## 2019-08-07 DIAGNOSIS — G4733 Obstructive sleep apnea (adult) (pediatric): Secondary | ICD-10-CM

## 2019-08-07 NOTE — Telephone Encounter (Signed)
Patient called and stated [facility] is requesting a hard script for his CPAP.    Once we receive the hard script we can fax over to [phone] along with chart note from 08/01/19

## 2019-08-09 LAB — HOLTER MONITOR - 48 HOUR
AF Count: 0
Analysis Time: 0:0 {titer}
Analyze Time Length Hours: 48
Analyze Time Length Minutes: 22
Bradycardia Runs: 0
Max Heart Rate: 122
Mean Heart Rate: 83
Min Heart Rate: 61
Pauses: 0
SVE Max Per Hour Time: 17:0 {titer}
SVE Max Per Hour: 7
SVE Percent Beats: 0.02 %
SVE Total Beats: 41
SVE couplet: 1
SVT Runs: 0
Tachycardia Longest Run Time: 1245
Tachycardia Longest Run: 114
Tachycardia Max Rate Time: 1245
Tachycardia Max Rate: 126
Tachycardia Runs: 3
Total Beats: 239431
VE Max Per Hour Time: 22:0 {titer}
VE Max Per Hour: 13
VE Percent Beats: 0.03 %
VE Total Beats: 83
VT Runs: 0

## 2019-08-09 NOTE — Telephone Encounter (Signed)
Pt calling checking on the status of script for his CPAP.

## 2019-08-13 MED ORDER — AUTO-TITRATING CPAP MACHINE *A*
0 refills | Status: AC
Start: 2019-08-13 — End: ?

## 2019-08-13 NOTE — Telephone Encounter (Signed)
faxed

## 2019-08-13 NOTE — Addendum Note (Signed)
Addended by: Renaldo Reel on: 08/13/2019 09:23 AM     Modules accepted: Orders

## 2019-08-13 NOTE — Telephone Encounter (Signed)
rx printed an placed in bin

## 2019-08-29 ENCOUNTER — Ambulatory Visit: Payer: 59 | Admitting: Primary Care

## 2019-08-30 ENCOUNTER — Encounter: Payer: Self-pay | Admitting: Primary Care

## 2019-10-10 ENCOUNTER — Ambulatory Visit: Payer: 59 | Admitting: Primary Care

## 2019-10-14 ENCOUNTER — Encounter: Payer: Self-pay | Admitting: Primary Care

## 2019-10-16 ENCOUNTER — Other Ambulatory Visit: Payer: Self-pay | Admitting: Primary Care

## 2019-11-07 ENCOUNTER — Telehealth: Payer: Self-pay | Admitting: Primary Care

## 2019-11-07 NOTE — Telephone Encounter (Signed)
Type of form: [facility]     Received by fax, mail, or dropped off: fax    What was done with form: form placed in pcp bin     Instructions (fax/mail/pick up/etc.): fax [phone]

## 2019-11-08 ENCOUNTER — Encounter: Payer: Self-pay | Admitting: Primary Care

## 2019-11-08 ENCOUNTER — Ambulatory Visit: Payer: 59 | Admitting: Primary Care

## 2019-11-08 VITALS — BP 114/66 | HR 102 | Temp 99.0°F | Wt 215.0 lb

## 2019-11-08 DIAGNOSIS — R002 Palpitations: Secondary | ICD-10-CM

## 2019-11-08 DIAGNOSIS — F319 Bipolar disorder, unspecified: Secondary | ICD-10-CM

## 2019-11-08 DIAGNOSIS — E039 Hypothyroidism, unspecified: Secondary | ICD-10-CM

## 2019-11-08 DIAGNOSIS — G4733 Obstructive sleep apnea (adult) (pediatric): Secondary | ICD-10-CM

## 2019-11-08 DIAGNOSIS — I10 Essential (primary) hypertension: Secondary | ICD-10-CM

## 2019-11-08 DIAGNOSIS — Z9989 Dependence on other enabling machines and devices: Secondary | ICD-10-CM

## 2019-11-08 DIAGNOSIS — Z0000 Encounter for general adult medical examination without abnormal findings: Secondary | ICD-10-CM

## 2019-11-08 DIAGNOSIS — Z111 Encounter for screening for respiratory tuberculosis: Secondary | ICD-10-CM

## 2019-11-08 DIAGNOSIS — M5412 Radiculopathy, cervical region: Secondary | ICD-10-CM

## 2019-11-08 NOTE — Progress Notes (Signed)
Visit performed as:             Office Visit, met with patient in person    Today we reviewed and updated Timothy French smoking status, activities of daily living, depression screen, fall risk, medications and allergies.   I have counseled the patient in the above areas.     Subjective:     Chief Complaint: Timothy French is a 53 y.o. male here for a/an Subsequent Annual Medicare Visit and Annual Exam (has form for [facility] healthcare that needs to be addressed)    In general, Timothy French rates their overall health as:  good      Patient Care Team:  Renaldo Reel, MD as PCP - General  Gilford Rile Marily Lente, MD as PCP - [facility]  Katherine Roan, MD (Bariatric Surgery)  Reymundo Poll, [place] [facility])     Current Outpatient Medications on File Prior to Visit   Medication Sig Dispense Refill    QUEtiapine (SEROQUEL) 100 MG tablet TAKE 1 TABLET(100 MG) BY MOUTH DAILY 30 tablet 1    venlafaxine (EFFEXOR-XR) 150 MG 24 hr capsule TAKE 1 CAPSULE BY MOUTH DAILY 30 capsule 1    auto-titrating CPAP (AUTOSET) machine Use as directed.  Pressure 5-15 mmH2O.  Include tubing and mask.   Dx -- G47.33 1 each 0    divalproex (DEPAKOTE ER) 500 MG 24 hr tablet TAKE 4 TABLETS BY MOUTH EVERY DAY 120 tablet 1    QUEtiapine (SEROQUEL) 300 MG tablet TAKE 2 TABLETS BY MOUTH NIGHTLY. MAXIMUM DAILY DOSE IS 2 TABLETS 180 tablet 3    fluticasone-salmeterol (AIRDUO RESPICLICK) 867-61 MCG/ACT inhaler Inhale 1 puff into the lungs 2 times daily 1 Inhaler 2    gabapentin (GABAPENTIN) 600 MG tablet Take 1 tablet (600 mg total) by mouth 3 times daily 90 tablet 5    levothyroxine (SYNTHROID, LEVOTHROID) 75 MCG tablet TAKE 1 TABLET BY MOUTH DAILY 30 tablet 3    Albuterol Sulfate (PROAIR RESPICLICK) 950 (90 Base) MCG/ACT AEPB Inhale 2 puffs into the lungs every 4-6 hours as needed 1 each 11    divalproex (DEPAKOTE ER) 500 MG 24 hr tablet TAKE 4 TABLETS BY MOUTH ONCE DAILY 120 tablet 1    varenicline (CHANTIX  STARTING PAK) 0.5 MG X 11 & 1 MG X 42 tablet pak Take one 0.58m tablet daily for 3 days, then one 0.579mtablet twice daily for 4 days, then one 47m77mablet twice daily. (Patient not taking: Reported on 11/08/2019) 53 tablet 0    cyclobenzaprine (FLEXERIL) 10 MG tablet TAKE 1 TABLET BY MOUTH THREE TIMES DAILY AS NEEDED FOR MUSCLE SPASMS 60 tablet 0    omeprazole (PRILOSEC) 20 MG capsule Take 1 capsule (20 mg total) by mouth daily (before breakfast) 30 capsule 5    albuterol HFA (PROVENTIL, VENTOLIN, PROAIR HFA) 108 (90 Base) MCG/ACT inhaler INHALE 2 PUFFS EVERY 4 HOURS IF NEEDED 1 each 5    bisacodyl (BISACODYL) 5 MG EC tablet Follow colonoscopy instructions (Patient not taking: Reported on 11/08/2019) 4 tablet 0    polyethylene glycol (GLYCOLAX) powder Follow colonoscopy prep instructions (Patient not taking: Reported on 11/08/2019) 255 g 0    levothyroxine (SYNTHROID, LEVOTHROID) 75 MCG tablet take 1 tablet by mouth once daily 30 tablet 1    generic DME CPAP supplies -- mask, tubing, filter  Use as directed.  G47.33 1 each 0     No current facility-administered medications on file prior to visit.  Allergies   Allergen Reactions    Latex Rash    Bee Venom Swelling    Clonazepam Other (See Comments)     Bradycardia    Naproxen Rash    Tramadol Rash    No Known Drug Allergy      Created by Conversion - 0;      Patient Active Problem List    Diagnosis Date Noted    Cervical radiculopathy 12/27/2018    OSA on CPAP 03/16/2017    Aftercare following surgery for injury and trauma 09/17/2535    S/P umbilical hernia repair 04/25/39 05/25/2016    Hypertension 05/14/2014    Asthma 05/14/2014    Bipolar affective 05/14/2014    ACP Staging Stage 1 Hypertension: 140-159 / 90-99 08/20/2010     Created by Conversion        Carpal Tunnel Syndrome 03/18/2010     Created by Conversion        Backache 08/06/2009     Created by Conversion        Joint Pain, Localized In The Hip 04/02/2009     Created by  Conversion        Cervicalgia 03/13/2009     Created by Conversion        Difficulty Breathing (Dyspnea) 03/13/2009     Created by Conversion        Esophageal Reflux 02/11/2009     Created by Conversion        Nicotine Dependence 02/11/2009     Created by Conversion        Chronic Pain 12/08/2008     Created by Conversion        Cervical Vertebral Fusion 11/03/2008     Created by Conversion        Asthma 11/28/2007     Created by Conversion        Hypothyroidism 11/28/2007     Created by Conversion        Bipolar Disorder 11/28/2007     Created by Conversion         Past Medical History:   Diagnosis Date    Asthma     Bipolar affective     Carpal tunnel syndrome     Depression     Headache     History of stomach ulcers     Hypertension     Machine dependence     Sleep apnea      Past Surgical History:   Procedure Laterality Date    BACK SURGERY      cervical fusion    HERNIA REPAIR      inguinal    NECK SURGERY  2012     Family History   Problem Relation Age of Onset    Other Paternal Grandfather         mental illness    Hypertension Other     Mental illness Other     Other Other         thyroid    No Known Problems Mother     Diabetes Type I Father     High Blood Pressure Father     Cancer Daughter         Brain    Diabetes Type I Maternal Grandmother     Heart Disease Maternal Grandmother      Social History     Socioeconomic History    Marital status: Single     Spouse name: Not on file    Number of children:  Not on file    Years of education: Not on file    Highest education level: Not on file   Occupational History    Not on file   Tobacco Use    Smoking status: Current Every Day Smoker     Packs/day: 1.00     Types: Cigarettes    Smokeless tobacco: Never Used   Substance and Sexual Activity    Alcohol use: No     Frequency: Never     Drinks per session: 1 or 2     Binge frequency: Never     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    Vision Screening Results (Welcome visit only):  No exam data present    Depression Screening Results:  Recent Review Flowsheet Data     PHQ Scores 11/08/2019 08/01/2019 07/12/2017 06/08/2016    PSQ2 Q1 - Interest/Pleasure N N - -    PSQ2 Q2 - Down, Depressed, Hopeless N Y - -    PHQ Q9 - Better Off Dead - 0 0 0    PHQ Calculated Score - 12 12 8         Opioid Use/DAST- 10 Screening Results:   How many times in the past year have you used an illegal drug or used a prescription medication for nonmedical reasons?: 0 (08/01/2019  8:40 AM)    Activities of Daily Living/Functional Screening Results:  Is the person deaf or does he/she have serious difficulty hearing?: N  Is this person blind or does he/she have serious difficulty seeing even when wearing glasses?: N  *Vision Status: Visual aid   Does this person have serious difficulty walking or climbing stairs?: N  *Walks in Home: Independent  *Climbing Stairs: Independent  Does this person have difficulty dressing or bathing?: N  *Shopping: Independent  *House Keeping: Independent  *Managing Own Medications: Independent  *Handling Finances: Independent  Difficulty doing errands due to a physicial, mental or emotional condition: No  Difficulty remembering or making decisions due to a physicial, mental or emotional condition: No      Fall Risk Screening Results:  Have you fallen in the last year?: No  Do you feel you are at risk for falling?: No      Assessment and Plan:     Cognitive Function:  Recall of recent and remote events appears:  Normal      Advanced Care Planning:  was discussed and patient received paperwork to review     The following health maintenance plan was reviewed with the patient:    Health Maintenance Topics  with due status: Overdue       Topic Date Due    HIV Screening USPSTF/[place] 08/01/1979    Hepatitis C Screening 07/31/1984    IMM-ZOSTER 07/31/2016    Colon Cancer Screening Other 10/31/2019     Health Maintenance Topics with due status: Not Due       Topic Last Completion Date    DEPRESSION SCREEN MONTHLY 11/08/2019  Health Maintenance Topics with due status: Completed       Topic Last Completion Date    IMM-INFLUENZA 08/01/2019     This health maintenance schedule, identified risks, a list of orders placed today and patient goals have been provided to Timothy French in the after visit summary.     Plan for any concerns identified during screening or risk assessments:  n/a

## 2019-11-08 NOTE — Patient Instructions (Signed)
Thank you for completing your Subsequent Annual Medicare Visit and Annual Exam (has form for [facility] that needs to be addressed)   with [place] today.     The purpose of this visits was to:     Screen for disease   Assess risk of future medical problems   Help develop a healthy lifestyle   Update vaccines   Get to know your doctor in case of an illness    Patient Care Team:  Renaldo Reel, MD as PCP - General  Gilford Rile Marily Lente, MD as PCP - [facility]  Katherine Roan, MD (Bariatric Surgery)  Reymundo Poll, [facility])     Medicare 5 Year Plan    The following items were identified as areas of concern during your screening today:  Smoking- This is a risk factor for many kids of Cancer, Heart Attack, Stroke, Kidney Problems, Eye Problems, Asthma, COPD, and can cause an overall decrease in energy.   BMI greater than 25 - This is a risk for Heart Attack, Stroke, High Blood Pressure, Diabetes, High Cholesterol and other complications.       The Health Maintenance table below identifies screening tests and immunizations recommended by your health care team:  Health Maintenance: These screening recommendations are based on USPSTF, [employer], and [place] state guidelines   Topic Date Due    HIV Screening  08/01/1979    Hepatitis C Screening  07/31/1984    Shingles Vaccine (1 of 2) 07/31/2016    Colon Cancer Screening  10/31/2019    DEPRESSION SCREEN MONTHLY  12/09/2019    Flu Shot  Completed     In addition, goals and orders placed to address these recommendations are listed in the "Today's Visit" section.    We wish you the best of health and look forward to seeing you again next year for your Annual Medicare Wellness Visit.     If you have any health care concerns before then, please do not hesitate to contact us.

## 2019-11-08 NOTE — Progress Notes (Signed)
~[facility]~    SUBJECTIVE       Shoulder  --continues to have 7/10 pain in shoulder  --MRI ordered in past  --working a lot on his new house  --neck pain with radiation comes and goes      COPD / Asthma / OSA  --using albuterol several times per week  --using CPAP every night  --smoking 1 ppd  --reports adherence to controller med  --interested stopping smoking    Thyroid  --on levothyroxine -- reports adherence    HTN  --off of antihypertensives    Chest pain   --recently had holter  --no pain since holter     Mood  --stable; feeling better  --reports adherence to medications    Chronic neck pain  --present for many years  --on gabapentin  --has right sided shooting pain      Health Maintenance Due   Topic Date Due    HIV Screening USPSTF/[place]  08/01/1979    Hepatitis C Screening  07/31/1984    IMM-ZOSTER (1 of 2) 07/31/2016    Colon Cancer Screening Other  10/31/2019       Medications Reviewed    OBJECTIVE     BP 114/66    Pulse 102    Temp 37.2 C (99 F) (Temporal)    Wt 97.5 kg (215 lb)    SpO2 97%    BMI 34.70 kg/m   General Appearance: No distress  COR: RRR, No Murmurs   Resp:  Respirations unlabored. Clear to auscultation bilaterally        Results:     48hr analyzed.   Sinus rhythm with normal heart rate variability.  Mean heart rate 83 bpm (range 61-122 bpm).   No significant arrhythmias.   Very rare atrial and ventricular ectopy.   No prolonged pauses present.    No symptoms reported.      There is electrodiagnostic evidence of chronic, right-sided polyradiculopathies best localizing to the C6 through C8 motor nerve roots.  There is no electrodiagnostic evidence of a right-sided median or ulnar neuropathy, or generalized large-fiber polyneuropathy.       Clinical Impression:   53 year old male with medical history of cervical fusion who presents with chronic neck pain with radiation of pain and paresthesias into right upper extremity. Neurologic examination reveals hyporeflexia in  right triceps with some suggestion of right tricep and wrist extension weakness. In the context of his electrodiagnostic testing, his neck pain and right arm sensory symptoms and weakness are best explained by multilevel right-sided cervical radiculopathies.          The 10-year ASCVD risk score Mikey Bussing DC Brooke Bonito., et al., 2013) is: 7%    Values used to calculate the score:      Age: 47 years      Sex: Male      Is Non-Hispanic African American: No      Diabetic: No      Tobacco smoker: Yes      Systolic Blood Pressure: 272 mmHg      Is BP treated: No      HDL Cholesterol: 56 mg/dL      Total Cholesterol: 204 mg/dL      DIAGNOSIS, ORDERS AND PLAN     HTN well controlled.  Cont current medications  Labs reviewed.  Rec rechecking lytes    Hypothyroidism, unspecified type    Bipolar affective disorder, remission status unspecified    Hypertension, unspecified type  -  Comprehensive metabolic panel; Future    Palpitations    OSA on CPAP    Annual physical exam  -     TB SKIN TEST  -     Measles IgG antibody; Future  -     Mumps antibody, IgG; Future  -     Rubella antibody, IgG; Future    Cervical radiculopathy  -     MR cervical spine without and with contrast; Future    Preventative health care      --Patient instructed to call if symptoms are not improving or worsening  --Follow-up arranged    Signed: Madolyn Frieze, MD on 11/09/2019 at 9:22 PM  [facility]

## 2019-11-11 ENCOUNTER — Other Ambulatory Visit
Admission: RE | Admit: 2019-11-11 | Discharge: 2019-11-11 | Disposition: A | Discharge status: Home / Self Care | Payer: 59 | Source: Ambulatory Visit | Attending: Primary Care | Admitting: Primary Care

## 2019-11-11 ENCOUNTER — Telehealth: Payer: Self-pay | Admitting: Primary Care

## 2019-11-11 ENCOUNTER — Ambulatory Visit: Payer: 59

## 2019-11-11 DIAGNOSIS — I10 Essential (primary) hypertension: Secondary | ICD-10-CM | POA: Insufficient documentation

## 2019-11-11 DIAGNOSIS — Z0000 Encounter for general adult medical examination without abnormal findings: Secondary | ICD-10-CM | POA: Insufficient documentation

## 2019-11-11 LAB — COMPREHENSIVE METABOLIC PANEL
ALT: 16 U/L (ref 0–50)
AST: 13 U/L (ref 0–50)
Albumin: 4.3 g/dL (ref 3.5–5.2)
Alk Phos: 76 U/L (ref 40–130)
Anion Gap: 10 (ref 7–16)
Bilirubin,Total: <0.2 mg/dL (ref 0.0–1.2)
CO2: 28 mmol/L (ref 20–28)
Calcium: 9.9 mg/dL (ref 8.6–10.2)
Chloride: 103 mmol/L (ref 96–108)
Creatinine: 1.11 mg/dL (ref 0.67–1.17)
GFR,Black: 87 *
GFR,Caucasian: 75 *
Glucose: 70 mg/dL (ref 60–99)
Lab: 11 mg/dL (ref 6–20)
Potassium: 4.7 mmol/L (ref 3.3–5.1)
Sodium: 141 mmol/L (ref 133–145)
Total Protein: 7.4 g/dL (ref 6.3–7.7)

## 2019-11-11 LAB — TB SKIN TEST: Induration:TB skin test: 0 mm

## 2019-11-11 NOTE — Progress Notes (Signed)
Ppd read and placed in previous encounter.

## 2019-11-11 NOTE — Progress Notes (Addendum)
[facility]    Date PPD placed:11/08/2019     PPD Location: right forearm      Lot #: [id number]      Expiration Date: 04/30/2021    Placed by: Jama Flavors, RN    Time Placed: [phone]     Patient agrees to return for reading of PPD in 48-72 hours or have it read by an RN elsewhere.    Signed: Jama Flavors, RN 11/11/2019 3:01 PM  [facility]  [address]  [place], [place] [address]  Phone: [phone]   Fax: [phone]

## 2019-11-11 NOTE — Progress Notes (Signed)
[facility]      PPD READING        Date PPD placed:  December 18     Results: 37mm    Read by: Jama Flavors, RN    Signed: Jama Flavors, RN 11/11/2019 3:47 PM  [facility]  [address]  [place], [place] [address]  Phone: [phone]   Fax: [phone]

## 2019-11-11 NOTE — Telephone Encounter (Signed)
Test: MR CERVICAL SPINE     Date: 12/04/2018    Time: 1:00Pm    Site: [place]    Prep: None     Patient Notified (Y/N): Yes, spoke with patient on phone

## 2019-11-12 LAB — RUBELLA ANTIBODY, IGG: Rubella IgG AB: POSITIVE

## 2019-11-12 LAB — MUMPS ANTIBODY, IGG: Mumps IgG: POSITIVE

## 2019-11-12 LAB — MEASLES IGG AB: Measles IgG: POSITIVE

## 2019-11-18 ENCOUNTER — Encounter: Payer: Self-pay | Admitting: Gastroenterology

## 2019-11-19 NOTE — Telephone Encounter (Signed)
Patient called back stating that [facility] contacted him and stated that they have received everything except lab results from 11/11/2019.  Faxed lab results to [facility] at patient's request.

## 2019-11-19 NOTE — Telephone Encounter (Signed)
Patient called requesting for PCP to expedite completion of form as patient needs form in order to provide care for patient's mother.  Form originally submitted on 11/07/2019.    Patient can be called back at [phone].

## 2019-11-20 NOTE — Telephone Encounter (Signed)
faexed , confirm, scanned

## 2019-12-05 ENCOUNTER — Ambulatory Visit: Payer: 59

## 2019-12-19 ENCOUNTER — Other Ambulatory Visit: Payer: Self-pay | Admitting: Primary Care

## 2019-12-19 DIAGNOSIS — F319 Bipolar disorder, unspecified: Secondary | ICD-10-CM

## 2019-12-19 DIAGNOSIS — E039 Hypothyroidism, unspecified: Secondary | ICD-10-CM

## 2019-12-19 MED ORDER — LEVOTHYROXINE SODIUM 75 MCG PO TABS *I*
75.0000 ug | ORAL_TABLET | Freq: Every day | ORAL | 1 refills | Status: DC
Start: 2019-12-19 — End: 2019-12-20

## 2019-12-19 MED ORDER — DIVALPROEX SODIUM 500 MG PO TB24 *I*
2000.0000 mg | ORAL_TABLET | Freq: Every day | ORAL | 1 refills | Status: DC
Start: 2019-12-19 — End: 2020-02-07

## 2019-12-19 NOTE — Telephone Encounter (Signed)
Dival Proex Extended release 500MG  Tabs;  QTY: 120     Levothyroxine .75MG  Tabs;  QTY: 30

## 2019-12-20 ENCOUNTER — Other Ambulatory Visit: Payer: Self-pay | Admitting: Primary Care

## 2019-12-20 DIAGNOSIS — E039 Hypothyroidism, unspecified: Secondary | ICD-10-CM

## 2019-12-20 DIAGNOSIS — F319 Bipolar disorder, unspecified: Secondary | ICD-10-CM

## 2019-12-20 MED ORDER — LEVOTHYROXINE SODIUM 75 MCG PO TABS *I*
75.0000 ug | ORAL_TABLET | Freq: Every day | ORAL | 1 refills | Status: DC
Start: 2019-12-20 — End: 2020-03-23

## 2019-12-20 NOTE — Telephone Encounter (Signed)
90 Days Supply Request- Levothyroxine 0.75MG  Tabs;  QTY: 90

## 2020-01-09 ENCOUNTER — Other Ambulatory Visit: Payer: Self-pay | Admitting: Primary Care

## 2020-02-06 ENCOUNTER — Ambulatory Visit: Payer: 59 | Admitting: Primary Care

## 2020-02-07 ENCOUNTER — Other Ambulatory Visit: Payer: Self-pay | Admitting: Primary Care

## 2020-02-07 DIAGNOSIS — M545 Low back pain, unspecified: Secondary | ICD-10-CM

## 2020-02-07 DIAGNOSIS — F319 Bipolar disorder, unspecified: Secondary | ICD-10-CM

## 2020-02-11 ENCOUNTER — Telehealth: Payer: Self-pay | Admitting: Primary Care

## 2020-02-11 ENCOUNTER — Other Ambulatory Visit: Payer: Self-pay | Admitting: Primary Care

## 2020-02-11 DIAGNOSIS — K219 Gastro-esophageal reflux disease without esophagitis: Secondary | ICD-10-CM

## 2020-02-11 MED ORDER — VENLAFAXINE HCL 150 MG PO CP24 *I*
150.0000 mg | ORAL_CAPSULE | Freq: Every day | ORAL | 1 refills | Status: DC
Start: 2020-02-11 — End: 2020-03-23

## 2020-02-11 MED ORDER — FLUTICASONE-SALMETEROL 232-14 MCG/ACT IN AEPB *A*
1.0000 | INHALATION_SPRAY | Freq: Two times a day (BID) | RESPIRATORY_TRACT | 2 refills | Status: DC
Start: 2020-02-11 — End: 2020-03-23

## 2020-02-11 NOTE — Telephone Encounter (Signed)
MEDICATION REFILL REQUEST  [id number] - [place], [place] - [address]  [address]  [place] [address]  Phone: [phone] Fax: [phone]      Last Appointment: 11/08/2019  Next Appointment:   Future Appointments   Date Time Provider Department Center   02/25/2020  3:30 PM [name], Robert J, MD [facility] None             Lab results: 08/01/19  1005   WBC 8.7   Hemoglobin 16.4   Hematocrit 50   RBC 5.3   Platelets 250        Lab Results   Component Value Date    HA1C 5.6 03/16/2017         Lab results: 08/01/19  1005   TSH 2.55           Lab results: 11/11/19  1504   Sodium 141   Potassium 4.7   Chloride 103   CO2 28   UN 11   Creatinine 1.11   GFR,Caucasian 75   GFR,Black 87   Glucose 70   Calcium 9.9   Total Protein 7.4   Albumin 4.3   ALT 16   AST 13   Alk Phos 76   Bilirubin,Total <0.2

## 2020-02-11 NOTE — Telephone Encounter (Signed)
Called and spoke with patient relaying new appt information.

## 2020-02-11 NOTE — Telephone Encounter (Signed)
-----   Message from [facility] sent at 02/11/2020  9:03 AM EDT -----  Regarding: rescheduled  Good morning  PCP will not be in clinic  Appt rescheduled  Please notify pt of new appt date/time  Thank you

## 2020-02-18 ENCOUNTER — Ambulatory Visit: Payer: 59 | Admitting: Primary Care

## 2020-02-25 ENCOUNTER — Ambulatory Visit: Payer: 59 | Admitting: Primary Care

## 2020-03-18 ENCOUNTER — Other Ambulatory Visit: Payer: Self-pay | Admitting: Primary Care

## 2020-03-18 DIAGNOSIS — K219 Gastro-esophageal reflux disease without esophagitis: Secondary | ICD-10-CM

## 2020-03-18 MED ORDER — OMEPRAZOLE 20 MG PO CPDR *I*
20.0000 mg | DELAYED_RELEASE_CAPSULE | Freq: Every day | ORAL | 5 refills | Status: DC
Start: 2020-03-18 — End: 2020-03-18

## 2020-03-23 ENCOUNTER — Other Ambulatory Visit: Payer: Self-pay | Admitting: Primary Care

## 2020-03-23 DIAGNOSIS — E039 Hypothyroidism, unspecified: Secondary | ICD-10-CM

## 2020-03-23 DIAGNOSIS — F319 Bipolar disorder, unspecified: Secondary | ICD-10-CM

## 2020-03-23 DIAGNOSIS — M545 Low back pain, unspecified: Secondary | ICD-10-CM

## 2020-03-23 DIAGNOSIS — K219 Gastro-esophageal reflux disease without esophagitis: Secondary | ICD-10-CM

## 2020-03-23 NOTE — Telephone Encounter (Signed)
Pt calling requesting refill of all meds. States is going through a divorce and wife will not let him near house. Pt is moving to [place], he states he needs a 90 day supply until he can find a new PCP.  Please advise

## 2020-03-24 MED ORDER — FLUTICASONE-SALMETEROL 232-14 MCG/ACT IN AEPB *A*
1.0000 | INHALATION_SPRAY | Freq: Two times a day (BID) | RESPIRATORY_TRACT | 1 refills | Status: AC
Start: 2020-03-24 — End: ?

## 2020-03-24 MED ORDER — GABAPENTIN 600 MG PO TABLET *I*
600.0000 mg | ORAL_TABLET | Freq: Three times a day (TID) | ORAL | 1 refills | Status: AC
Start: 2020-03-24 — End: ?

## 2020-03-24 MED ORDER — DIVALPROEX SODIUM 500 MG PO TB24 *I*
2000.0000 mg | ORAL_TABLET | Freq: Every day | ORAL | 1 refills | Status: AC
Start: 2020-03-24 — End: ?

## 2020-03-24 MED ORDER — ALBUTEROL SULFATE HFA 108 (90 BASE) MCG/ACT IN AERS *I*
INHALATION_SPRAY | RESPIRATORY_TRACT | 5 refills | Status: AC
Start: 2020-03-24 — End: ?

## 2020-03-24 MED ORDER — VENLAFAXINE HCL 150 MG PO CP24 *I*
150.0000 mg | ORAL_CAPSULE | Freq: Every day | ORAL | 1 refills | Status: DC
Start: 2020-03-24 — End: 2020-03-27

## 2020-03-24 MED ORDER — QUETIAPINE FUMARATE 300 MG PO TABS *I*
600.0000 mg | ORAL_TABLET | Freq: Every evening | ORAL | 3 refills | Status: DC
Start: 2020-03-24 — End: 2020-07-22

## 2020-03-24 MED ORDER — OMEPRAZOLE 20 MG PO CPDR *I*
20.0000 mg | DELAYED_RELEASE_CAPSULE | Freq: Every day | ORAL | 1 refills | Status: AC
Start: 2020-03-24 — End: ?

## 2020-03-24 MED ORDER — PROAIR RESPICLICK 108 (90 BASE) MCG/ACT IN AEPB
2.0000 | INHALATION_SPRAY | RESPIRATORY_TRACT | 11 refills | Status: AC | PRN
Start: 2020-03-24 — End: ?

## 2020-03-24 MED ORDER — QUETIAPINE FUMARATE 100 MG PO TABS *I*
100.0000 mg | ORAL_TABLET | Freq: Every day | ORAL | 1 refills | Status: DC
Start: 2020-03-24 — End: 2020-06-29

## 2020-03-24 MED ORDER — LEVOTHYROXINE SODIUM 75 MCG PO TABS *I*
75.0000 ug | ORAL_TABLET | Freq: Every day | ORAL | 1 refills | Status: AC
Start: 2020-03-24 — End: ?

## 2020-03-24 NOTE — Telephone Encounter (Signed)
Pt calling to check on status of refill.  Pt states he needs these refills before Thursday because he is leaving out of state.  Please advise.

## 2020-03-27 ENCOUNTER — Other Ambulatory Visit: Payer: Self-pay | Admitting: Primary Care

## 2020-03-27 MED ORDER — VENLAFAXINE HCL 150 MG PO CP24 *I*
150.0000 mg | ORAL_CAPSULE | Freq: Every day | ORAL | 1 refills | Status: AC
Start: 2020-03-27 — End: ?

## 2020-06-26 ENCOUNTER — Other Ambulatory Visit: Payer: Self-pay | Admitting: Primary Care

## 2020-06-26 DIAGNOSIS — E039 Hypothyroidism, unspecified: Secondary | ICD-10-CM

## 2020-06-26 DIAGNOSIS — F319 Bipolar disorder, unspecified: Secondary | ICD-10-CM

## 2020-07-20 ENCOUNTER — Other Ambulatory Visit: Payer: Self-pay | Admitting: Primary Care

## 2020-07-21 ENCOUNTER — Other Ambulatory Visit: Payer: Self-pay | Admitting: Primary Care

## 2020-08-06 ENCOUNTER — Telehealth: Payer: Self-pay | Admitting: Primary Care

## 2020-08-06 NOTE — Telephone Encounter (Signed)
Type of form: Medical record request    Received by fax, mail, or dropped off: fax    What was done with form: sent to [facility]    Instructions (fax/mail/pick up/etc.):     Patient moved to [facility] provider removed from banner.

## 2020-08-30 ENCOUNTER — Inpatient Hospital Stay (HOSPITAL_COMMUNITY): Payer: Medicare Other

## 2020-08-30 ENCOUNTER — Encounter (HOSPITAL_COMMUNITY): Payer: Self-pay | Admitting: *Deleted

## 2020-08-30 ENCOUNTER — Other Ambulatory Visit: Payer: Self-pay

## 2020-08-30 ENCOUNTER — Inpatient Hospital Stay (HOSPITAL_COMMUNITY): Payer: Medicare Other | Admitting: Certified Registered Nurse Anesthetist

## 2020-08-30 ENCOUNTER — Inpatient Hospital Stay (HOSPITAL_COMMUNITY)
Admission: EM | Admit: 2020-08-30 | Discharge: 2020-10-21 | DRG: 957 | Disposition: E | Payer: Medicare Other | Attending: General Surgery | Admitting: General Surgery

## 2020-08-30 ENCOUNTER — Emergency Department (HOSPITAL_COMMUNITY): Payer: Medicare Other

## 2020-08-30 ENCOUNTER — Inpatient Hospital Stay (HOSPITAL_COMMUNITY): Admission: EM | Disposition: E | Payer: Self-pay | Source: Home / Self Care

## 2020-08-30 DIAGNOSIS — Z9911 Dependence on respirator [ventilator] status: Secondary | ICD-10-CM

## 2020-08-30 DIAGNOSIS — Z638 Other specified problems related to primary support group: Secondary | ICD-10-CM

## 2020-08-30 DIAGNOSIS — S14109A Unspecified injury at unspecified level of cervical spinal cord, initial encounter: Secondary | ICD-10-CM | POA: Diagnosis not present

## 2020-08-30 DIAGNOSIS — J9690 Respiratory failure, unspecified, unspecified whether with hypoxia or hypercapnia: Secondary | ICD-10-CM

## 2020-08-30 DIAGNOSIS — Y92410 Unspecified street and highway as the place of occurrence of the external cause: Secondary | ICD-10-CM

## 2020-08-30 DIAGNOSIS — R1110 Vomiting, unspecified: Secondary | ICD-10-CM

## 2020-08-30 DIAGNOSIS — E870 Hyperosmolality and hypernatremia: Secondary | ICD-10-CM | POA: Diagnosis not present

## 2020-08-30 DIAGNOSIS — J189 Pneumonia, unspecified organism: Secondary | ICD-10-CM | POA: Diagnosis not present

## 2020-08-30 DIAGNOSIS — S12390A Other displaced fracture of fourth cervical vertebra, initial encounter for closed fracture: Secondary | ICD-10-CM | POA: Diagnosis present

## 2020-08-30 DIAGNOSIS — R001 Bradycardia, unspecified: Secondary | ICD-10-CM | POA: Diagnosis not present

## 2020-08-30 DIAGNOSIS — E039 Hypothyroidism, unspecified: Secondary | ICD-10-CM | POA: Diagnosis present

## 2020-08-30 DIAGNOSIS — M2578 Osteophyte, vertebrae: Secondary | ICD-10-CM | POA: Diagnosis present

## 2020-08-30 DIAGNOSIS — Z419 Encounter for procedure for purposes other than remedying health state, unspecified: Secondary | ICD-10-CM

## 2020-08-30 DIAGNOSIS — G8250 Quadriplegia, unspecified: Secondary | ICD-10-CM | POA: Diagnosis not present

## 2020-08-30 DIAGNOSIS — E878 Other disorders of electrolyte and fluid balance, not elsewhere classified: Secondary | ICD-10-CM | POA: Diagnosis not present

## 2020-08-30 DIAGNOSIS — L89154 Pressure ulcer of sacral region, stage 4: Secondary | ICD-10-CM | POA: Diagnosis not present

## 2020-08-30 DIAGNOSIS — E875 Hyperkalemia: Secondary | ICD-10-CM | POA: Diagnosis not present

## 2020-08-30 DIAGNOSIS — G9609 Other spinal cerebrospinal fluid leak: Secondary | ICD-10-CM | POA: Diagnosis not present

## 2020-08-30 DIAGNOSIS — N5089 Other specified disorders of the male genital organs: Secondary | ICD-10-CM | POA: Diagnosis not present

## 2020-08-30 DIAGNOSIS — S12490A Other displaced fracture of fifth cervical vertebra, initial encounter for closed fracture: Principal | ICD-10-CM | POA: Diagnosis present

## 2020-08-30 DIAGNOSIS — F419 Anxiety disorder, unspecified: Secondary | ICD-10-CM | POA: Diagnosis present

## 2020-08-30 DIAGNOSIS — S270XXA Traumatic pneumothorax, initial encounter: Secondary | ICD-10-CM | POA: Diagnosis present

## 2020-08-30 DIAGNOSIS — T1490XA Injury, unspecified, initial encounter: Secondary | ICD-10-CM

## 2020-08-30 DIAGNOSIS — Z452 Encounter for adjustment and management of vascular access device: Secondary | ICD-10-CM

## 2020-08-30 DIAGNOSIS — Z4659 Encounter for fitting and adjustment of other gastrointestinal appliance and device: Secondary | ICD-10-CM

## 2020-08-30 DIAGNOSIS — I462 Cardiac arrest due to underlying cardiac condition: Secondary | ICD-10-CM | POA: Diagnosis not present

## 2020-08-30 DIAGNOSIS — Z515 Encounter for palliative care: Secondary | ICD-10-CM | POA: Diagnosis not present

## 2020-08-30 DIAGNOSIS — S14102A Unspecified injury at C2 level of cervical spinal cord, initial encounter: Secondary | ICD-10-CM | POA: Diagnosis present

## 2020-08-30 DIAGNOSIS — I10 Essential (primary) hypertension: Secondary | ICD-10-CM | POA: Diagnosis present

## 2020-08-30 DIAGNOSIS — R339 Retention of urine, unspecified: Secondary | ICD-10-CM | POA: Diagnosis not present

## 2020-08-30 DIAGNOSIS — Z981 Arthrodesis status: Secondary | ICD-10-CM | POA: Diagnosis not present

## 2020-08-30 DIAGNOSIS — M4802 Spinal stenosis, cervical region: Secondary | ICD-10-CM | POA: Diagnosis present

## 2020-08-30 DIAGNOSIS — X509XXA Other and unspecified overexertion or strenuous movements or postures, initial encounter: Secondary | ICD-10-CM | POA: Diagnosis not present

## 2020-08-30 DIAGNOSIS — D62 Acute posthemorrhagic anemia: Secondary | ICD-10-CM | POA: Diagnosis not present

## 2020-08-30 DIAGNOSIS — S0093XA Contusion of unspecified part of head, initial encounter: Secondary | ICD-10-CM | POA: Diagnosis present

## 2020-08-30 DIAGNOSIS — R0902 Hypoxemia: Secondary | ICD-10-CM

## 2020-08-30 DIAGNOSIS — J9601 Acute respiratory failure with hypoxia: Secondary | ICD-10-CM | POA: Diagnosis not present

## 2020-08-30 DIAGNOSIS — Z0189 Encounter for other specified special examinations: Secondary | ICD-10-CM

## 2020-08-30 DIAGNOSIS — Z978 Presence of other specified devices: Secondary | ICD-10-CM

## 2020-08-30 DIAGNOSIS — F17200 Nicotine dependence, unspecified, uncomplicated: Secondary | ICD-10-CM | POA: Diagnosis present

## 2020-08-30 DIAGNOSIS — Z66 Do not resuscitate: Secondary | ICD-10-CM | POA: Diagnosis not present

## 2020-08-30 DIAGNOSIS — Z20822 Contact with and (suspected) exposure to covid-19: Secondary | ICD-10-CM | POA: Diagnosis present

## 2020-08-30 DIAGNOSIS — R578 Other shock: Secondary | ICD-10-CM | POA: Diagnosis present

## 2020-08-30 DIAGNOSIS — S2249XA Multiple fractures of ribs, unspecified side, initial encounter for closed fracture: Secondary | ICD-10-CM

## 2020-08-30 DIAGNOSIS — F3130 Bipolar disorder, current episode depressed, mild or moderate severity, unspecified: Secondary | ICD-10-CM | POA: Diagnosis present

## 2020-08-30 DIAGNOSIS — Z9289 Personal history of other medical treatment: Secondary | ICD-10-CM

## 2020-08-30 DIAGNOSIS — J939 Pneumothorax, unspecified: Secondary | ICD-10-CM

## 2020-08-30 HISTORY — PX: ANTERIOR CERVICAL DECOMP/DISCECTOMY FUSION: SHX1161

## 2020-08-30 HISTORY — PX: POSTERIOR CERVICAL FUSION/FORAMINOTOMY: SHX5038

## 2020-08-30 LAB — POCT I-STAT 7, (LYTES, BLD GAS, ICA,H+H)
Acid-Base Excess: 0 mmol/L (ref 0.0–2.0)
Acid-base deficit: 1 mmol/L (ref 0.0–2.0)
Bicarbonate: 26.5 mmol/L (ref 20.0–28.0)
Bicarbonate: 24.4 mmol/L (ref 20.0–28.0)
Calcium, Ion: 1.18 mmol/L (ref 1.15–1.40)
Calcium, Ion: 1.09 mmol/L — ABNORMAL LOW (ref 1.15–1.40)
HCT: 38 % — ABNORMAL LOW (ref 39.0–52.0)
HCT: 33 % — ABNORMAL LOW (ref 39.0–52.0)
Hemoglobin: 12.9 g/dL — ABNORMAL LOW (ref 13.0–17.0)
Hemoglobin: 11.2 g/dL — ABNORMAL LOW (ref 13.0–17.0)
O2 Saturation: 100 %
O2 Saturation: 100 %
Patient temperature: 35
Potassium: 4.6 mmol/L (ref 3.5–5.1)
Potassium: 4 mmol/L (ref 3.5–5.1)
Sodium: 138 mmol/L (ref 135–145)
Sodium: 139 mmol/L (ref 135–145)
TCO2: 28 mmol/L (ref 22–32)
TCO2: 26 mmol/L (ref 22–32)
pCO2 arterial: 47.7 mmHg (ref 32.0–48.0)
pCO2 arterial: 38.3 mmHg (ref 32.0–48.0)
pH, Arterial: 7.353 (ref 7.350–7.450)
pH, Arterial: 7.404 (ref 7.350–7.450)
pO2, Arterial: 237 mmHg — ABNORMAL HIGH (ref 83.0–108.0)
pO2, Arterial: 232 mmHg — ABNORMAL HIGH (ref 83.0–108.0)

## 2020-08-30 LAB — URINALYSIS, ROUTINE W REFLEX MICROSCOPIC
Bilirubin Urine: NEGATIVE
Glucose, UA: NEGATIVE mg/dL
Hgb urine dipstick: NEGATIVE
Ketones, ur: NEGATIVE mg/dL
Leukocytes,Ua: NEGATIVE
Nitrite: NEGATIVE
Protein, ur: NEGATIVE mg/dL
Specific Gravity, Urine: >1.046 — ABNORMAL HIGH (ref 1.005–1.030)
pH: 6 (ref 5.0–8.0)

## 2020-08-30 LAB — PROTIME-INR
INR: 1.1 (ref 0.8–1.2)
Prothrombin Time: 13.9 seconds (ref 11.4–15.2)

## 2020-08-30 LAB — COMPREHENSIVE METABOLIC PANEL
ALT: 13 U/L (ref 0–44)
AST: 19 U/L (ref 15–41)
Albumin: 3.2 g/dL — ABNORMAL LOW (ref 3.5–5.0)
Alkaline Phosphatase: 48 U/L (ref 38–126)
Anion gap: 12 (ref 5–15)
BUN: 20 mg/dL (ref 6–20)
CO2: 23 mmol/L (ref 22–32)
Calcium: 8.7 mg/dL — ABNORMAL LOW (ref 8.9–10.3)
Chloride: 103 mmol/L (ref 98–111)
Creatinine, Ser: 0.78 mg/dL (ref 0.61–1.24)
GFR, Estimated: >60 mL/min (ref >60)
Glucose, Bld: 126 mg/dL — ABNORMAL HIGH (ref 70–99)
Potassium: 3.5 mmol/L (ref 3.5–5.1)
Sodium: 138 mmol/L (ref 135–145)
Total Bilirubin: 0.4 mg/dL (ref 0.3–1.2)
Total Protein: 6.2 g/dL — ABNORMAL LOW (ref 6.5–8.1)

## 2020-08-30 LAB — I-STAT CHEM 8, ED
BUN: 22 mg/dL — ABNORMAL HIGH (ref 6–20)
Calcium, Ion: 1.16 mmol/L (ref 1.15–1.40)
Chloride: 101 mmol/L (ref 98–111)
Creatinine, Ser: 0.7 mg/dL (ref 0.61–1.24)
Glucose, Bld: 121 mg/dL — ABNORMAL HIGH (ref 70–99)
HCT: 36 % — ABNORMAL LOW (ref 39.0–52.0)
Hemoglobin: 12.2 g/dL — ABNORMAL LOW (ref 13.0–17.0)
Potassium: 3.5 mmol/L (ref 3.5–5.1)
Sodium: 140 mmol/L (ref 135–145)
TCO2: 26 mmol/L (ref 22–32)

## 2020-08-30 LAB — SAMPLE TO BLOOD BANK

## 2020-08-30 LAB — CBC
HCT: 36.4 % — ABNORMAL LOW (ref 39.0–52.0)
Hemoglobin: 11.9 g/dL — ABNORMAL LOW (ref 13.0–17.0)
MCH: 31 pg (ref 26.0–34.0)
MCHC: 32.7 g/dL (ref 30.0–36.0)
MCV: 94.8 fL (ref 80.0–100.0)
Platelets: 163 10*3/uL (ref 150–400)
RBC: 3.84 MIL/uL — ABNORMAL LOW (ref 4.22–5.81)
RDW: 13.2 % (ref 11.5–15.5)
WBC: 6.4 10*3/uL (ref 4.0–10.5)
nRBC: 0 % (ref 0.0–0.2)

## 2020-08-30 LAB — ETHANOL: Alcohol, Ethyl (B): <10 mg/dL (ref <10)

## 2020-08-30 LAB — RESPIRATORY PANEL BY RT PCR (FLU A&B, COVID)
Influenza A by PCR: NEGATIVE
Influenza B by PCR: NEGATIVE
SARS Coronavirus 2 by RT PCR: NEGATIVE

## 2020-08-30 LAB — TYPE AND SCREEN
ABO/RH(D): O POS
Antibody Screen: NEGATIVE

## 2020-08-30 LAB — HIV ANTIBODY (ROUTINE TESTING W REFLEX): HIV Screen 4th Generation wRfx: NONREACTIVE

## 2020-08-30 LAB — LACTIC ACID, PLASMA: Lactic Acid, Venous: 2.1 mmol/L (ref 0.5–1.9)

## 2020-08-30 LAB — ABO/RH: ABO/RH(D): O POS

## 2020-08-30 SURGERY — ANTERIOR CERVICAL DECOMPRESSION/DISCECTOMY FUSION 1 LEVEL
Anesthesia: General | Site: Neck

## 2020-08-30 MED ORDER — PROPOFOL 10 MG/ML IV BOLUS
INTRAVENOUS | Status: DC | PRN
  Administered 2020-08-30: 130 mg via INTRAVENOUS
  Administered 2020-08-30: 50 mg via INTRAVENOUS

## 2020-08-30 MED ORDER — ROCURONIUM BROMIDE 10 MG/ML (PF) SYRINGE
PREFILLED_SYRINGE | INTRAVENOUS | Status: AC
  Filled 2020-08-30: qty 40

## 2020-08-30 MED ORDER — DEXAMETHASONE SODIUM PHOSPHATE 10 MG/ML IJ SOLN
INTRAMUSCULAR | Status: DC | PRN
  Administered 2020-08-30: 10 mg via INTRAVENOUS

## 2020-08-30 MED ORDER — ROCURONIUM BROMIDE 10 MG/ML (PF) SYRINGE
PREFILLED_SYRINGE | INTRAVENOUS | Status: DC | PRN
  Administered 2020-08-30 (×2): 20 mg via INTRAVENOUS
  Administered 2020-08-30: 60 mg via INTRAVENOUS
  Administered 2020-08-30: 20 mg via INTRAVENOUS

## 2020-08-30 MED ORDER — ACETAMINOPHEN 325 MG PO TABS: 650.0000 mg | ORAL_TABLET | ORAL | Status: DC | PRN

## 2020-08-30 MED ORDER — LACTATED RINGERS IV SOLN: INTRAVENOUS | Status: DC | PRN

## 2020-08-30 MED ORDER — MORPHINE SULFATE (PF) 4 MG/ML IV SOLN
4.0000 mg | INTRAVENOUS | Status: DC | PRN
  Administered 2020-08-30 – 2020-09-21 (×6): 4 mg via INTRAVENOUS
  Filled 2020-08-30 (×6): qty 1

## 2020-08-30 MED ORDER — LIDOCAINE-EPINEPHRINE 1 %-1:100000 IJ SOLN
INTRAMUSCULAR | Status: DC | PRN
  Administered 2020-08-30: 10 mL via INTRADERMAL
  Administered 2020-08-30: 4 mL via INTRADERMAL

## 2020-08-30 MED ORDER — MIDAZOLAM HCL 2 MG/2ML IJ SOLN
INTRAMUSCULAR | Status: AC
  Filled 2020-08-30: qty 2

## 2020-08-30 MED ORDER — SUCCINYLCHOLINE CHLORIDE 200 MG/10ML IV SOSY
PREFILLED_SYRINGE | INTRAVENOUS | Status: DC | PRN
  Administered 2020-08-30: 160 mg via INTRAVENOUS

## 2020-08-30 MED ORDER — ALBUMIN HUMAN 5 % IV SOLN: INTRAVENOUS | Status: DC | PRN

## 2020-08-30 MED ORDER — MIDAZOLAM HCL 2 MG/2ML IJ SOLN
INTRAMUSCULAR | Status: DC | PRN
  Administered 2020-08-30: 2 mg via INTRAVENOUS

## 2020-08-30 MED ORDER — SUGAMMADEX SODIUM 200 MG/2ML IV SOLN
INTRAVENOUS | Status: DC | PRN
  Administered 2020-08-30: 300 mg via INTRAVENOUS

## 2020-08-30 MED ORDER — THROMBIN 5000 UNITS EX SOLR
CUTANEOUS | Status: AC
  Filled 2020-08-30: qty 10000

## 2020-08-30 MED ORDER — PROPOFOL 10 MG/ML IV BOLUS
INTRAVENOUS | Status: AC
  Filled 2020-08-30: qty 20

## 2020-08-30 MED ORDER — IOHEXOL 300 MG/ML  SOLN
100.0000 mL | Freq: Once | INTRAMUSCULAR | Status: AC | PRN
  Administered 2020-08-30: 100 mL via INTRAVENOUS

## 2020-08-30 MED ORDER — EPHEDRINE 5 MG/ML INJ
INTRAVENOUS | Status: AC
  Filled 2020-08-30: qty 10

## 2020-08-30 MED ORDER — FENTANYL CITRATE (PF) 250 MCG/5ML IJ SOLN
INTRAMUSCULAR | Status: AC
  Filled 2020-08-30: qty 5

## 2020-08-30 MED ORDER — CHLORHEXIDINE GLUCONATE 0.12 % MT SOLN
15.0000 mL | Freq: Two times a day (BID) | OROMUCOSAL | Status: DC
  Administered 2020-08-31 – 2020-09-02 (×4): 15 mL via OROMUCOSAL
  Filled 2020-08-30: qty 15

## 2020-08-30 MED ORDER — MORPHINE SULFATE (PF) 4 MG/ML IV SOLN
4.0000 mg | Freq: Once | INTRAVENOUS | Status: AC
  Administered 2020-08-30: 4 mg via INTRAVENOUS

## 2020-08-30 MED ORDER — POTASSIUM CHLORIDE IN NACL 20-0.9 MEQ/L-% IV SOLN
INTRAVENOUS | Status: DC
  Filled 2020-08-30 (×10): qty 1000

## 2020-08-30 MED ORDER — SUGAMMADEX SODIUM 500 MG/5ML IV SOLN
INTRAVENOUS | Status: AC
  Filled 2020-08-30: qty 5

## 2020-08-30 MED ORDER — PHENYLEPHRINE 40 MCG/ML (10ML) SYRINGE FOR IV PUSH (FOR BLOOD PRESSURE SUPPORT)
PREFILLED_SYRINGE | INTRAVENOUS | Status: AC
  Filled 2020-08-30: qty 20

## 2020-08-30 MED ORDER — EPHEDRINE SULFATE 50 MG/ML IJ SOLN
INTRAMUSCULAR | Status: DC | PRN
  Administered 2020-08-30 (×2): 5 mg via INTRAVENOUS

## 2020-08-30 MED ORDER — OXYCODONE HCL 5 MG PO TABS
10.0000 mg | ORAL_TABLET | ORAL | Status: DC | PRN
  Administered 2020-08-31 (×5): 10 mg via ORAL
  Filled 2020-08-30 (×6): qty 2

## 2020-08-30 MED ORDER — PHENYLEPHRINE HCL-NACL 10-0.9 MG/250ML-% IV SOLN
INTRAVENOUS | Status: DC | PRN
  Administered 2020-08-30: 25 ug/min via INTRAVENOUS

## 2020-08-30 MED ORDER — CHLORHEXIDINE GLUCONATE CLOTH 2 % EX PADS
6.0000 | MEDICATED_PAD | Freq: Every day | CUTANEOUS | Status: DC
  Administered 2020-08-31 – 2020-09-13 (×11): 6 via TOPICAL

## 2020-08-30 MED ORDER — ONDANSETRON HCL 4 MG/2ML IJ SOLN: 4.0000 mg | Freq: Once | INTRAMUSCULAR | Status: DC | PRN

## 2020-08-30 MED ORDER — ARTIFICIAL TEARS OPHTHALMIC OINT
TOPICAL_OINTMENT | OPHTHALMIC | Status: DC | PRN
  Administered 2020-08-30: 1 via OPHTHALMIC

## 2020-08-30 MED ORDER — SUCCINYLCHOLINE CHLORIDE 200 MG/10ML IV SOSY
PREFILLED_SYRINGE | INTRAVENOUS | Status: AC
  Filled 2020-08-30: qty 20

## 2020-08-30 MED ORDER — BACITRACIN ZINC 500 UNIT/GM EX OINT
TOPICAL_OINTMENT | CUTANEOUS | Status: DC | PRN
  Administered 2020-08-30: 1 via TOPICAL

## 2020-08-30 MED ORDER — OXYCODONE HCL 5 MG PO TABS: 5.0000 mg | ORAL_TABLET | ORAL | Status: DC | PRN

## 2020-08-30 MED ORDER — FENTANYL CITRATE (PF) 100 MCG/2ML IJ SOLN: 25.0000 ug | INTRAMUSCULAR | Status: DC | PRN

## 2020-08-30 MED ORDER — FENTANYL CITRATE (PF) 250 MCG/5ML IJ SOLN
INTRAMUSCULAR | Status: DC | PRN
Start: 2020-08-30 — End: 2020-08-30
  Administered 2020-08-30 (×9): 50 ug via INTRAVENOUS

## 2020-08-30 MED ORDER — ONDANSETRON HCL 4 MG/2ML IJ SOLN
INTRAMUSCULAR | Status: DC | PRN
  Administered 2020-08-30: 4 mg via INTRAVENOUS

## 2020-08-30 MED ORDER — 0.9 % SODIUM CHLORIDE (POUR BTL) OPTIME
TOPICAL | Status: DC | PRN
  Administered 2020-08-30: 1000 mL

## 2020-08-30 MED ORDER — GABAPENTIN 100 MG PO CAPS
100.0000 mg | ORAL_CAPSULE | Freq: Three times a day (TID) | ORAL | Status: DC
  Administered 2020-08-31: 100 mg via ORAL
  Filled 2020-08-30: qty 1

## 2020-08-30 MED ORDER — PHENYLEPHRINE HCL-NACL 10-0.9 MG/250ML-% IV SOLN
INTRAVENOUS | Status: DC | PRN
  Administered 2020-08-30: 50 ug/min via INTRAVENOUS

## 2020-08-30 MED ORDER — OXYCODONE HCL 5 MG/5ML PO SOLN: 5.0000 mg | Freq: Once | ORAL | Status: DC | PRN

## 2020-08-30 MED ORDER — ONDANSETRON HCL 4 MG/2ML IJ SOLN: 4.0000 mg | Freq: Four times a day (QID) | INTRAMUSCULAR | Status: DC | PRN

## 2020-08-30 MED ORDER — METHOCARBAMOL 1000 MG/10ML IJ SOLN
1000.0000 mg | Freq: Three times a day (TID) | INTRAVENOUS | Status: DC | PRN
  Filled 2020-08-30 (×2): qty 10

## 2020-08-30 MED ORDER — LIDOCAINE-EPINEPHRINE 1 %-1:100000 IJ SOLN
INTRAMUSCULAR | Status: AC
  Filled 2020-08-30: qty 1

## 2020-08-30 MED ORDER — LIDOCAINE 2% (20 MG/ML) 5 ML SYRINGE
INTRAMUSCULAR | Status: DC | PRN
  Administered 2020-08-30: 20 mg via INTRAVENOUS

## 2020-08-30 MED ORDER — OXYCODONE HCL 5 MG PO TABS: 5.0000 mg | ORAL_TABLET | Freq: Once | ORAL | Status: DC | PRN

## 2020-08-30 MED ORDER — BACITRACIN ZINC 500 UNIT/GM EX OINT
TOPICAL_OINTMENT | CUTANEOUS | Status: AC
  Filled 2020-08-30: qty 28.35

## 2020-08-30 MED ORDER — ONDANSETRON 4 MG PO TBDP: 4.0000 mg | ORAL_TABLET | Freq: Four times a day (QID) | ORAL | Status: DC | PRN

## 2020-08-30 MED ORDER — PHENYLEPHRINE HCL (PRESSORS) 10 MG/ML IV SOLN
INTRAVENOUS | Status: DC | PRN
  Administered 2020-08-30: 80 ug via INTRAVENOUS

## 2020-08-30 MED ORDER — CEFAZOLIN SODIUM-DEXTROSE 2-3 GM-%(50ML) IV SOLR
INTRAVENOUS | Status: DC | PRN
  Administered 2020-08-30: 2 g via INTRAVENOUS

## 2020-08-30 MED ORDER — MORPHINE SULFATE (PF) 4 MG/ML IV SOLN
INTRAVENOUS | Status: AC
  Filled 2020-08-30: qty 1

## 2020-08-30 MED ORDER — SODIUM CHLORIDE 0.9 % IV SOLN: INTRAVENOUS | Status: DC | PRN

## 2020-08-30 MED ORDER — THROMBIN 5000 UNITS EX SOLR
OROMUCOSAL | Status: DC | PRN
  Administered 2020-08-30 (×2): 5 mL via TOPICAL

## 2020-08-30 MED ORDER — GLYCOPYRROLATE 0.2 MG/ML IJ SOLN
INTRAMUSCULAR | Status: DC | PRN
  Administered 2020-08-30: .2 mg via INTRAVENOUS

## 2020-08-30 MED ORDER — VASOPRESSIN 20 UNIT/ML IV SOLN
INTRAVENOUS | Status: AC
  Filled 2020-08-30: qty 1

## 2020-08-30 SURGICAL SUPPLY — 81 items
BAG DECANTER FOR FLEXI CONT (MISCELLANEOUS) ×6 IMPLANT
BAND RUBBER #18 3X1/16 STRL (MISCELLANEOUS) ×6 IMPLANT
BENZOIN TINCTURE PRP APPL 2/3 (GAUZE/BANDAGES/DRESSINGS) IMPLANT
BIT DRILL 2.4 (BIT) ×2 IMPLANT
BIT DRILL 2.4MM (BIT) ×1
BLADE CLIPPER SURG (BLADE) ×3 IMPLANT
BLADE SURG 11 STRL SS (BLADE) IMPLANT
BUR MATCHSTICK NEURO 3.0 LAGG (BURR) ×3 IMPLANT
BUR PRECISION FLUTE 5.0 (BURR) ×3 IMPLANT
CANISTER SUCT 3000ML PPV (MISCELLANEOUS) ×6 IMPLANT
COUNTER NEEDLE 20 DBL MAG RED (NEEDLE) ×3 IMPLANT
COVER WAND RF STERILE (DRAPES) ×6 IMPLANT
DECANTER SPIKE VIAL GLASS SM (MISCELLANEOUS) ×6 IMPLANT
DERMABOND ADVANCED (GAUZE/BANDAGES/DRESSINGS) ×2
DERMABOND ADVANCED .7 DNX12 (GAUZE/BANDAGES/DRESSINGS) ×1 IMPLANT
DRAPE BACK TABLE (DRAPES) ×3 IMPLANT
DRAPE C-ARM 42X72 X-RAY (DRAPES) ×15 IMPLANT
DRAPE HALF SHEET 40X57 (DRAPES) IMPLANT
DRAPE LAPAROTOMY 100X72 PEDS (DRAPES) ×6 IMPLANT
DRAPE MICROSCOPE LEICA (MISCELLANEOUS) ×3 IMPLANT
DURAPREP 6ML APPLICATOR 50/CS (WOUND CARE) ×3 IMPLANT
ELECT COATED BLADE 2.86 ST (ELECTRODE) ×3 IMPLANT
ELECT REM PT RETURN 9FT ADLT (ELECTROSURGICAL) ×6
ELECTRODE REM PT RTRN 9FT ADLT (ELECTROSURGICAL) ×2 IMPLANT
GAUZE 4X4 16PLY RFD (DISPOSABLE) ×3 IMPLANT
GAUZE SPONGE 4X4 12PLY STRL (GAUZE/BANDAGES/DRESSINGS) IMPLANT
GLOVE BIO SURGEON STRL SZ 6.5 (GLOVE) ×8 IMPLANT
GLOVE BIO SURGEON STRL SZ7 (GLOVE) ×9 IMPLANT
GLOVE BIO SURGEON STRL SZ7.5 (GLOVE) ×6 IMPLANT
GLOVE BIO SURGEONS STRL SZ 6.5 (GLOVE) ×4
GLOVE BIOGEL PI IND STRL 6.5 (GLOVE) ×2 IMPLANT
GLOVE BIOGEL PI IND STRL 7.5 (GLOVE) ×5 IMPLANT
GLOVE BIOGEL PI INDICATOR 6.5 (GLOVE) ×4
GLOVE BIOGEL PI INDICATOR 7.5 (GLOVE) ×10
GLOVE EXAM NITRILE LRG STRL (GLOVE) IMPLANT
GLOVE EXAM NITRILE XL STR (GLOVE) IMPLANT
GLOVE EXAM NITRILE XS STR PU (GLOVE) IMPLANT
GOWN STRL REUS W/ TWL LRG LVL3 (GOWN DISPOSABLE) ×3 IMPLANT
GOWN STRL REUS W/ TWL XL LVL3 (GOWN DISPOSABLE) ×1 IMPLANT
GOWN STRL REUS W/TWL 2XL LVL3 (GOWN DISPOSABLE) IMPLANT
GOWN STRL REUS W/TWL LRG LVL3 (GOWN DISPOSABLE) ×6
GOWN STRL REUS W/TWL XL LVL3 (GOWN DISPOSABLE) ×2
HEMOSTAT POWDER KIT SURGIFOAM (HEMOSTASIS) ×6 IMPLANT
KIT BASIN OR (CUSTOM PROCEDURE TRAY) ×6 IMPLANT
KIT TURNOVER KIT B (KITS) ×6 IMPLANT
NEEDLE HYPO 22GX1.5 SAFETY (NEEDLE) ×6 IMPLANT
NEEDLE SPNL 18GX3.5 QUINCKE PK (NEEDLE) ×3 IMPLANT
NS IRRIG 1000ML POUR BTL (IV SOLUTION) ×6 IMPLANT
PACK LAMINECTOMY NEURO (CUSTOM PROCEDURE TRAY) ×3 IMPLANT
PAD ARMBOARD 7.5X6 YLW CONV (MISCELLANEOUS) ×15 IMPLANT
PIN DISTRACTION 14MM (PIN) IMPLANT
PIN MAYFIELD SKULL DISP (PIN) ×3 IMPLANT
PLATE 1 21XLCK NS SPNE CVD (Plate) ×1 IMPLANT
PLATE 1 ATLANTIS TRANS (Plate) ×2 IMPLANT
PUTTY DBF 1CC CORTICAL FIBERS (Putty) ×3 IMPLANT
ROD PRE-CUT CCM 3.5X100 (Rod) ×6 IMPLANT
SCREW MULTI AXIAL 3.5X14MM (Screw) ×24 IMPLANT
SCREW MULTI AXIAL 4.5X24 (Screw) ×12 IMPLANT
SCREW ST 13X4XST VA NS SPNE (Screw) ×4 IMPLANT
SCREW ST VAR 4 ATL (Screw) ×8 IMPLANT
SEALANT ADHERUS EXTEND TIP (MISCELLANEOUS) ×3 IMPLANT
SET SCREW INFINITY IFIX THOR (Screw) ×36 IMPLANT
SPACER BONE CORNERSTONE 6X14 (Orthopedic Implant) ×3 IMPLANT
SPONGE INTESTINAL PEANUT (DISPOSABLE) ×3 IMPLANT
SPONGE LAP 4X18 RFD (DISPOSABLE) IMPLANT
SPONGE SURGIFOAM ABS GEL SZ50 (HEMOSTASIS) IMPLANT
STAPLER VISISTAT 35W (STAPLE) ×3 IMPLANT
SUT ETHILON 3 0 FSL (SUTURE) IMPLANT
SUT MNCRL AB 3-0 PS2 18 (SUTURE) IMPLANT
SUT MON AB 3-0 SH 27 (SUTURE) ×4
SUT MON AB 3-0 SH27 (SUTURE) ×2 IMPLANT
SUT VIC AB 0 CT1 18XCR BRD8 (SUTURE) ×1 IMPLANT
SUT VIC AB 0 CT1 8-18 (SUTURE) ×2
SUT VIC AB 2-0 CP2 18 (SUTURE) ×3 IMPLANT
SUT VIC AB 3-0 SH 8-18 (SUTURE) ×6 IMPLANT
SYR CONTROL 10ML LL (SYRINGE) ×3 IMPLANT
TAPE CLOTH 3X10 TAN LF (GAUZE/BANDAGES/DRESSINGS) ×3 IMPLANT
TOWEL GREEN STERILE (TOWEL DISPOSABLE) ×6 IMPLANT
TOWEL GREEN STERILE FF (TOWEL DISPOSABLE) ×6 IMPLANT
UNDERPAD 30X36 HEAVY ABSORB (UNDERPADS AND DIAPERS) ×3 IMPLANT
WATER STERILE IRR 1000ML POUR (IV SOLUTION) ×6 IMPLANT
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
## 2020-08-30 NOTE — ED Notes (Signed)
Patient transported to CT 

## 2020-08-30 NOTE — ED Notes (Signed)
Marchelle Folks, daughter, [phone] would like an update when available/to talk to the pt
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
## 2020-08-30 NOTE — ED Notes (Signed)
Updated daughter and placed her on the contacts list.  She is an Charity fundraiser and requested she be the point of contact.

## 2020-08-30 NOTE — Anesthesia Procedure Notes (Addendum)
Arterial Line Insertion Start/EndOctober 26, 2021 3:52 PM, 09/15/2020 4:00 PM Performed by: Sonda Primes, CRNA, CRNA  Patient location: OR. Preanesthetic checklist: patient identified, IV checked, site marked, risks and benefits discussed, surgical consent, monitors and equipment checked, pre-op evaluation, timeout performed and anesthesia consent Catheter size: 20 G Seldinger technique used  Attempts: 2 Procedure performed without using ultrasound guided technique. Following insertion, dressing applied and Biopatch. Post procedure assessment: normal  Patient tolerated the procedure well with no immediate complications.
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
## 2020-08-30 NOTE — ED Notes (Signed)
Pt placed on NRB per [facility], EDP request

## 2020-08-30 NOTE — ED Notes (Signed)
Neurosurgeon at bedside °

## 2020-08-30 NOTE — ED Notes (Signed)
Paged trauma PA re pt temp.
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
## 2020-08-30 NOTE — ED Notes (Signed)
Neurosurgery PA at bedside.  PT ao x 4. Able to move only his L arm, slightly.
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
## 2020-08-30 NOTE — Anesthesia Procedure Notes (Signed)
Procedure Name: Intubation Date/Time: 05-Sep-2020 3:45 PM Performed by: Jed Limerick, CRNA Pre-anesthesia Checklist: Patient identified, Emergency Drugs available, Suction available and Patient being monitored Patient Re-evaluated:Patient Re-evaluated prior to induction Oxygen Delivery Method: Circle System Utilized Preoxygenation: Pre-oxygenation with 100% oxygen Induction Type: IV induction and Rapid sequence Laryngoscope Size: Glidescope and 4 Grade View: Grade I Tube type: Oral Tube size: 7.5 mm Number of attempts: 1 Airway Equipment and Method: Video-laryngoscopy and Rigid stylet Placement Confirmation: ETT inserted through vocal cords under direct vision,  positive ETCO2 and breath sounds checked- equal and bilateral Secured at: 21 cm Tube secured with: Tape Dental Injury: Teeth and Oropharynx as per pre-operative assessment  Difficulty Due To: Difficulty was anticipated, Difficult Airway- due to cervical collar and Difficult Airway-  due to neck instability Comments: Elective glidescope intubation d/t neck instability/fracture. Neck maintained in neutral alignment with manual stabilization.

## 2020-08-30 NOTE — Anesthesia Postprocedure Evaluation (Signed)
Anesthesia Post Note  Patient: Sal Spratley  Procedure(s) Performed: ANTERIOR CERVICAL DISCECTOMY AND INSTRUMENTED FUSION Cervical four - Cervical five (N/A Neck) POSTERIOR CERVICAL DECOMPRESSION AND INSTRUMENTED FUSION CERVICAL THREE TO THORACIC TWO (N/A Neck)     Patient location during evaluation: PACU Anesthesia Type: General Level of consciousness: awake, patient cooperative and responds to stimulation Pain management: pain level controlled Vital Signs Assessment: post-procedure vital signs reviewed and stable Respiratory status: spontaneous breathing, respiratory function stable, nonlabored ventilation and patient connected to nasal cannula oxygen Anesthetic complications: no   No complications documented.  Last Vitals:  Vitals:   09/17/20 2245 2020-09-17 2300  BP: (!) 101/51 (!) 100/54  Pulse: 77 67  Resp: 12 12  Temp:    SpO2: 94% 95%    Last Pain:  Vitals:   September 17, 2020 1329  TempSrc:   PainSc: 7                  Rayansh Herbst COKER

## 2020-08-30 NOTE — Brief Op Note (Signed)
09/16/2020  9:22 PM  PATIENT:  Aaron [name]  54 y.o. male  PRE-OPERATIVE DIAGNOSIS:  UNSTABLE CERVICAL SPINE FRACTURE  POST-OPERATIVE DIAGNOSIS:  UNSTABLE CERVICAL SPINE FRACTURE  PROCEDURE:  Procedure(s): ANTERIOR CERVICAL DISCECTOMY AND INSTRUMENTED FUSION Cervical four - Cervical five (N/A) POSTERIOR CERVICAL DECOMPRESSION AND INSTRUMENTED FUSION CERVICAL THREE TO THORACIC TWO (N/A)  SURGEON:  Surgeon(s) and Role:    * Jadene Pierini, MD - Primary  PHYSICIAN ASSISTANT:   ASSISTANTS: Kennon Portela NP   ANESTHESIA:   general  EBL:  1000 mL   BLOOD ADMINISTERED:none  DRAINS: none   LOCAL MEDICATIONS USED:  LIDOCAINE   SPECIMEN:  No Specimen  DISPOSITION OF SPECIMEN:  N/A  COUNTS:  YES  TOURNIQUET:  * No tourniquets in log *  DICTATION: .Note written in EPIC  PLAN OF CARE: Admit to inpatient   PATIENT DISPOSITION:  PACU - hemodynamically stable.   Delay start of Pharmacological VTE agent (>24hrs) due to surgical blood loss or risk of bleeding: yes

## 2020-08-30 NOTE — Transfer of Care (Signed)
Immediate Anesthesia Transfer of Care Note  Patient: Aaron [name]  Procedure(s) Performed: ANTERIOR CERVICAL DISCECTOMY AND INSTRUMENTED FUSION Cervical four - Cervical five (N/A Neck) POSTERIOR CERVICAL DECOMPRESSION AND INSTRUMENTED FUSION CERVICAL THREE TO THORACIC TWO (N/A Neck)  Patient Location: PACU  Anesthesia Type:General  Level of Consciousness: drowsy  Airway & Oxygen Therapy: Patient Spontanous Breathing and Patient connected to face mask oxygen  Post-op Assessment: Report given to RN and Post -op Vital signs reviewed and stable  Post vital signs: Reviewed and stable  Last Vitals:  Vitals Value Taken Time  BP 99/53 19-Sep-2020 2230  Temp    Pulse 76 2020/09/19 2231  Resp 11 2020/09/19 2231  SpO2 99 % 09/19/2020 2231  Vitals shown include unvalidated device data.  Last Pain:  Vitals:   September 19, 2020 1329  TempSrc:   PainSc: 7          Complications: No complications documented.

## 2020-08-30 NOTE — ED Notes (Signed)
Updated pt daughter via telephone on pt status

## 2020-08-30 NOTE — Progress Notes (Signed)
Neurosurgery Service Post-operative progress note  Assessment & Plan: 54 y.o. man s/p C4-5 ACDF, C3-T2 PSIF.  -no collar needed, activity / head of bed as tolerated -okay for DVT chemoprophylaxis on POD2 (10/12)  Aaron [name]  2020/09/07 9:25 PM

## 2020-08-30 NOTE — ED Notes (Signed)
Warm blankets placed.
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
## 2020-08-30 NOTE — ED Notes (Signed)
Pt does have minimal movement of LUE when going to MRI, still no sensation or movement in other extremities

## 2020-08-30 NOTE — Anesthesia Procedure Notes (Signed)
Central Venous Catheter Insertion Performed by: Kipp Brood, MD, anesthesiologist Start/End10-20-21 10:00 PM, 09-09-2020 10:10 PM Patient location: Pre-op. Preanesthetic checklist: patient identified, IV checked, site marked, risks and benefits discussed, surgical consent, monitors and equipment checked, pre-op evaluation, timeout performed and anesthesia consent Lidocaine 1% used for infiltration and patient sedated Hand hygiene performed  and maximum sterile barriers used  Catheter size: 8 Fr Total catheter length 16. Central line was placed.Double lumen Procedure performed without using ultrasound guided technique. Ultrasound Notes:image(s) printed for medical record Attempts: 1 Following insertion, dressing applied and line sutured. Post procedure assessment: blood return through all ports  Patient tolerated the procedure well with no immediate complications.

## 2020-08-30 NOTE — ED Notes (Signed)
Pt returned from CT, no return of sensation to extremities.

## 2020-08-31 ENCOUNTER — Inpatient Hospital Stay (HOSPITAL_COMMUNITY): Payer: Medicare Other

## 2020-08-31 ENCOUNTER — Encounter (HOSPITAL_COMMUNITY): Payer: Self-pay | Admitting: Neurological Surgery

## 2020-08-31 DIAGNOSIS — F419 Anxiety disorder, unspecified: Secondary | ICD-10-CM

## 2020-08-31 DIAGNOSIS — I10 Essential (primary) hypertension: Secondary | ICD-10-CM

## 2020-08-31 DIAGNOSIS — E039 Hypothyroidism, unspecified: Secondary | ICD-10-CM

## 2020-08-31 DIAGNOSIS — F319 Bipolar disorder, unspecified: Secondary | ICD-10-CM

## 2020-08-31 DIAGNOSIS — M62838 Other muscle spasm: Secondary | ICD-10-CM

## 2020-08-31 HISTORY — DX: Essential (primary) hypertension: I10

## 2020-08-31 HISTORY — DX: Other muscle spasm: M62.838

## 2020-08-31 HISTORY — DX: Bipolar disorder, unspecified: F31.9

## 2020-08-31 HISTORY — DX: Hypothyroidism, unspecified: E03.9

## 2020-08-31 HISTORY — DX: Anxiety disorder, unspecified: F41.9

## 2020-08-31 LAB — CBC
HCT: 29.6 % — ABNORMAL LOW (ref 39.0–52.0)
Hemoglobin: 9.5 g/dL — ABNORMAL LOW (ref 13.0–17.0)
MCH: 30.9 pg (ref 26.0–34.0)
MCHC: 32.1 g/dL (ref 30.0–36.0)
MCV: 96.4 fL (ref 80.0–100.0)
Platelets: 119 10*3/uL — ABNORMAL LOW (ref 150–400)
RBC: 3.07 MIL/uL — ABNORMAL LOW (ref 4.22–5.81)
RDW: 13.3 % (ref 11.5–15.5)
WBC: 9.8 10*3/uL (ref 4.0–10.5)
nRBC: 0 % (ref 0.0–0.2)

## 2020-08-31 LAB — BASIC METABOLIC PANEL
Anion gap: 7 (ref 5–15)
BUN: 10 mg/dL (ref 6–20)
CO2: 25 mmol/L (ref 22–32)
Calcium: 7.3 mg/dL — ABNORMAL LOW (ref 8.9–10.3)
Chloride: 106 mmol/L (ref 98–111)
Creatinine, Ser: 0.69 mg/dL (ref 0.61–1.24)
GFR, Estimated: >60 mL/min (ref >60)
Glucose, Bld: 117 mg/dL — ABNORMAL HIGH (ref 70–99)
Potassium: 4.4 mmol/L (ref 3.5–5.1)
Sodium: 138 mmol/L (ref 135–145)

## 2020-08-31 LAB — MRSA PCR SCREENING: MRSA by PCR: NEGATIVE

## 2020-08-31 MED ORDER — GABAPENTIN 300 MG PO CAPS
300.0000 mg | ORAL_CAPSULE | Freq: Three times a day (TID) | ORAL | Status: DC
  Administered 2020-08-31 (×2): 300 mg via ORAL
  Filled 2020-08-31 (×2): qty 1

## 2020-08-31 MED ORDER — NOREPINEPHRINE 4 MG/250ML-% IV SOLN
0.0000 ug/min | INTRAVENOUS | Status: DC
  Administered 2020-08-31: 2 ug/min via INTRAVENOUS
  Filled 2020-08-31: qty 250
  Filled 2020-08-31: qty 500

## 2020-08-31 MED ORDER — IPRATROPIUM-ALBUTEROL 0.5-2.5 (3) MG/3ML IN SOLN
3.0000 mL | Freq: Four times a day (QID) | RESPIRATORY_TRACT | Status: DC | PRN
  Administered 2020-08-31: 3 mL via RESPIRATORY_TRACT
  Filled 2020-08-31: qty 3

## 2020-08-31 MED ORDER — TRAMADOL HCL 50 MG PO TABS
50.0000 mg | ORAL_TABLET | Freq: Four times a day (QID) | ORAL | Status: DC
  Administered 2020-08-31 – 2020-09-11 (×40): 50 mg via ORAL
  Filled 2020-08-31 (×41): qty 1

## 2020-08-31 MED ORDER — LACTATED RINGERS IV BOLUS
1000.0000 mL | Freq: Once | INTRAVENOUS | Status: AC
  Administered 2020-08-31: 1000 mL via INTRAVENOUS

## 2020-08-31 MED ORDER — ALBUMIN HUMAN 25 % IV SOLN
12.5000 g | Freq: Once | INTRAVENOUS | Status: AC
  Administered 2020-08-31: 12.5 g via INTRAVENOUS
  Filled 2020-08-31: qty 50

## 2020-08-31 MED FILL — Thrombin For Soln 5000 Unit: CUTANEOUS | Qty: 5000 | Status: AC
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
## 2020-08-31 NOTE — Progress Notes (Signed)
Patient reports taking several home medications for bipolar depression, anxiety and hypothyroidism. RN contacted daughter to obtain list of home medications; daughter referred RN to patient's son. RN called patient's son, but son was not available. RN will attempt to contact patient's son again closer to morning.
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
## 2020-08-31 NOTE — Progress Notes (Signed)
Assisted tele visit to patient with wife.  Aaron [name] P, RN  

## 2020-08-31 NOTE — Progress Notes (Signed)
Patient belongs arrived on unit with patient: -2 black shoes -2 black socks -1 ripped tank top -1 pair ripped jeans -1 ripped black jacket -1 ripped black boxer shorts -1 gold ring -1 silver ring -1 silver earring with ball stud RN will send jewelry down to safe.

## 2020-08-31 NOTE — Progress Notes (Signed)
-  1 gold ring -1silver ring -1 earring w/1 stud Items given to security to put in safe. Yellow sheet and locker tag placed in patient's chart. Per security, please send yellow sheet and tag with a staff member at time of item retrieval.

## 2020-09-01 ENCOUNTER — Inpatient Hospital Stay (HOSPITAL_COMMUNITY): Payer: Medicare Other | Admitting: Anesthesiology

## 2020-09-01 ENCOUNTER — Inpatient Hospital Stay (HOSPITAL_COMMUNITY): Payer: Medicare Other

## 2020-09-01 ENCOUNTER — Encounter (HOSPITAL_COMMUNITY): Payer: Self-pay | Admitting: Neurological Surgery

## 2020-09-01 LAB — BASIC METABOLIC PANEL
Anion gap: 8 (ref 5–15)
BUN: 11 mg/dL (ref 6–20)
CO2: 27 mmol/L (ref 22–32)
Calcium: 7.4 mg/dL — ABNORMAL LOW (ref 8.9–10.3)
Chloride: 101 mmol/L (ref 98–111)
Creatinine, Ser: 0.85 mg/dL (ref 0.61–1.24)
GFR, Estimated: >60 mL/min (ref >60)
Glucose, Bld: 108 mg/dL — ABNORMAL HIGH (ref 70–99)
Potassium: 4.4 mmol/L (ref 3.5–5.1)
Sodium: 136 mmol/L (ref 135–145)

## 2020-09-01 LAB — CBC
HCT: 27.1 % — ABNORMAL LOW (ref 39.0–52.0)
Hemoglobin: 8.8 g/dL — ABNORMAL LOW (ref 13.0–17.0)
MCH: 31.9 pg (ref 26.0–34.0)
MCHC: 32.5 g/dL (ref 30.0–36.0)
MCV: 98.2 fL (ref 80.0–100.0)
Platelets: 96 10*3/uL — ABNORMAL LOW (ref 150–400)
RBC: 2.76 MIL/uL — ABNORMAL LOW (ref 4.22–5.81)
RDW: 13.8 % (ref 11.5–15.5)
WBC: 9.4 10*3/uL (ref 4.0–10.5)
nRBC: 0 % (ref 0.0–0.2)

## 2020-09-01 LAB — POCT I-STAT 7, (LYTES, BLD GAS, ICA,H+H)
Acid-Base Excess: 3 mmol/L — ABNORMAL HIGH (ref 0.0–2.0)
Acid-Base Excess: 3 mmol/L — ABNORMAL HIGH (ref 0.0–2.0)
Bicarbonate: 29.1 mmol/L — ABNORMAL HIGH (ref 20.0–28.0)
Bicarbonate: 28.7 mmol/L — ABNORMAL HIGH (ref 20.0–28.0)
Calcium, Ion: 1.05 mmol/L — ABNORMAL LOW (ref 1.15–1.40)
Calcium, Ion: 1.04 mmol/L — ABNORMAL LOW (ref 1.15–1.40)
HCT: 29 % — ABNORMAL LOW (ref 39.0–52.0)
HCT: 27 % — ABNORMAL LOW (ref 39.0–52.0)
Hemoglobin: 9.9 g/dL — ABNORMAL LOW (ref 13.0–17.0)
Hemoglobin: 9.2 g/dL — ABNORMAL LOW (ref 13.0–17.0)
O2 Saturation: 100 %
O2 Saturation: 98 %
Patient temperature: 99.4
Patient temperature: 99.4
Potassium: 4.4 mmol/L (ref 3.5–5.1)
Potassium: 4.3 mmol/L (ref 3.5–5.1)
Sodium: 136 mmol/L (ref 135–145)
Sodium: 138 mmol/L (ref 135–145)
TCO2: 31 mmol/L (ref 22–32)
TCO2: 30 mmol/L (ref 22–32)
pCO2 arterial: 53.1 mmHg — ABNORMAL HIGH (ref 32.0–48.0)
pCO2 arterial: 49.8 mmHg — ABNORMAL HIGH (ref 32.0–48.0)
pH, Arterial: 7.349 — ABNORMAL LOW (ref 7.350–7.450)
pH, Arterial: 7.371 (ref 7.350–7.450)
pO2, Arterial: 189 mmHg — ABNORMAL HIGH (ref 83.0–108.0)
pO2, Arterial: 106 mmHg (ref 83.0–108.0)

## 2020-09-01 LAB — MAGNESIUM
Magnesium: 1.7 mg/dL (ref 1.7–2.4)
Magnesium: 1.9 mg/dL (ref 1.7–2.4)

## 2020-09-01 LAB — TRIGLYCERIDES: Triglycerides: 56 mg/dL (ref <150)

## 2020-09-01 LAB — PHOSPHORUS: Phosphorus: 1.3 mg/dL — ABNORMAL LOW (ref 2.5–4.6)

## 2020-09-01 MED ORDER — GABAPENTIN 250 MG/5ML PO SOLN
600.0000 mg | Freq: Three times a day (TID) | ORAL | Status: DC
  Administered 2020-09-01 – 2020-09-21 (×57): 600 mg
  Filled 2020-09-01 (×62): qty 12

## 2020-09-01 MED ORDER — POTASSIUM PHOSPHATES 15 MMOLE/5ML IV SOLN
10.0000 mmol | Freq: Once | INTRAVENOUS | Status: AC
  Administered 2020-09-01: 10 mmol via INTRAVENOUS
  Filled 2020-09-01: qty 3.33

## 2020-09-01 MED ORDER — PROSOURCE TF PO LIQD
45.0000 mL | Freq: Two times a day (BID) | ORAL | Status: DC
  Administered 2020-09-01: 45 mL
  Filled 2020-09-01: qty 45

## 2020-09-01 MED ORDER — PROPOFOL 10 MG/ML IV BOLUS
INTRAVENOUS | Status: DC | PRN
  Administered 2020-09-01: 100 mg via INTRAVENOUS

## 2020-09-01 MED ORDER — ORAL CARE MOUTH RINSE
15.0000 mL | OROMUCOSAL | Status: DC
  Administered 2020-09-01 – 2020-09-21 (×204): 15 mL via OROMUCOSAL

## 2020-09-01 MED ORDER — PIVOT 1.5 CAL PO LIQD
1000.0000 mL | ORAL | Status: DC
  Administered 2020-09-01 – 2020-09-16 (×16): 1000 mL
  Filled 2020-09-01 (×11): qty 1000

## 2020-09-01 MED ORDER — FENTANYL CITRATE (PF) 100 MCG/2ML IJ SOLN
50.0000 ug | Freq: Once | INTRAMUSCULAR | Status: AC
  Administered 2020-09-03: 50 ug via INTRAVENOUS
  Filled 2020-09-01: qty 2

## 2020-09-01 MED ORDER — ALBUMIN HUMAN 25 % IV SOLN
12.5000 g | Freq: Once | INTRAVENOUS | Status: AC
  Administered 2020-09-01: 12.5 g via INTRAVENOUS
  Filled 2020-09-01: qty 50

## 2020-09-01 MED ORDER — PROSOURCE TF PO LIQD
45.0000 mL | Freq: Every day | ORAL | Status: DC
  Administered 2020-09-02 – 2020-09-17 (×16): 45 mL
  Filled 2020-09-01 (×16): qty 45

## 2020-09-01 MED ORDER — VITAL HIGH PROTEIN PO LIQD
1000.0000 mL | ORAL | Status: AC
  Administered 2020-09-01: 1000 mL
  Filled 2020-09-01: qty 1000

## 2020-09-01 MED ORDER — SUCCINYLCHOLINE CHLORIDE 20 MG/ML IJ SOLN
INTRAMUSCULAR | Status: DC | PRN
  Administered 2020-09-01: 100 mg via INTRAVENOUS

## 2020-09-01 MED ORDER — QUETIAPINE FUMARATE 200 MG PO TABS
600.0000 mg | ORAL_TABLET | Freq: Every day | ORAL | Status: DC
  Administered 2020-09-01 – 2020-09-03 (×3): 600 mg
  Filled 2020-09-01 (×3): qty 3

## 2020-09-01 MED ORDER — FENTANYL BOLUS VIA INFUSION
50.0000 ug | INTRAVENOUS | Status: DC | PRN
  Administered 2020-09-11 – 2020-09-20 (×10): 50 ug via INTRAVENOUS
  Filled 2020-09-01: qty 50

## 2020-09-01 MED ORDER — VALPROIC ACID 250 MG/5ML PO SOLN
500.0000 mg | Freq: Four times a day (QID) | ORAL | Status: DC
  Administered 2020-09-01 – 2020-09-03 (×8): 500 mg
  Filled 2020-09-01 (×10): qty 10

## 2020-09-01 MED ORDER — SODIUM CHLORIDE 0.9% FLUSH: 10.0000 mL | INTRAVENOUS | Status: DC | PRN

## 2020-09-01 MED ORDER — PROPOFOL 1000 MG/100ML IV EMUL
0.0000 ug/kg/min | INTRAVENOUS | Status: DC
  Administered 2020-09-01: 10 ug/kg/min via INTRAVENOUS
  Administered 2020-09-01 (×3): 25 ug/kg/min via INTRAVENOUS
  Administered 2020-09-02: 20 ug/kg/min via INTRAVENOUS
  Administered 2020-09-02: 25 ug/kg/min via INTRAVENOUS
  Administered 2020-09-03: 20 ug/kg/min via INTRAVENOUS
  Administered 2020-09-03: 15 ug/kg/min via INTRAVENOUS
  Administered 2020-09-04 – 2020-09-05 (×3): 10 ug/kg/min via INTRAVENOUS
  Administered 2020-09-06 – 2020-09-07 (×3): 20 ug/kg/min via INTRAVENOUS
  Administered 2020-09-07: 30 ug/kg/min via INTRAVENOUS
  Administered 2020-09-08: 25 ug/kg/min via INTRAVENOUS
  Administered 2020-09-08: 30 ug/kg/min via INTRAVENOUS
  Administered 2020-09-09: 10 ug/kg/min via INTRAVENOUS
  Administered 2020-09-10 (×2): 20 ug/kg/min via INTRAVENOUS
  Administered 2020-09-10 – 2020-09-11 (×3): 30 ug/kg/min via INTRAVENOUS
  Administered 2020-09-11 – 2020-09-12 (×2): 25 ug/kg/min via INTRAVENOUS
  Administered 2020-09-15: 0 ug/kg/min via INTRAVENOUS
  Filled 2020-09-01 (×31): qty 100

## 2020-09-01 MED ORDER — SODIUM CHLORIDE 0.9% FLUSH
10.0000 mL | Freq: Two times a day (BID) | INTRAVENOUS | Status: DC
  Administered 2020-09-01 – 2020-09-11 (×19): 10 mL
  Administered 2020-09-12: 20 mL
  Administered 2020-09-12 – 2020-09-21 (×17): 10 mL

## 2020-09-01 MED ORDER — VENLAFAXINE HCL 50 MG PO TABS
75.0000 mg | ORAL_TABLET | Freq: Two times a day (BID) | ORAL | Status: DC
  Administered 2020-09-01 – 2020-09-03 (×5): 75 mg
  Filled 2020-09-01 (×7): qty 2

## 2020-09-01 MED ORDER — MAGNESIUM SULFATE 2 GM/50ML IV SOLN
2.0000 g | Freq: Once | INTRAVENOUS | Status: AC
  Administered 2020-09-01: 2 g via INTRAVENOUS
  Filled 2020-09-01: qty 50

## 2020-09-01 MED ORDER — GUAIFENESIN 100 MG/5ML PO SOLN
15.0000 mL | Freq: Four times a day (QID) | ORAL | Status: DC
  Administered 2020-09-01 – 2020-09-11 (×42): 300 mg via ORAL
  Filled 2020-09-01 (×42): qty 15

## 2020-09-01 MED ORDER — SODIUM CHLORIDE 0.9 % IV SOLN
2.0000 g | Freq: Three times a day (TID) | INTRAVENOUS | Status: DC
  Administered 2020-09-01 – 2020-09-04 (×10): 2 g via INTRAVENOUS
  Filled 2020-09-01 (×10): qty 2

## 2020-09-01 MED ORDER — LEVOTHYROXINE SODIUM 75 MCG PO TABS
75.0000 ug | ORAL_TABLET | Freq: Every day | ORAL | Status: DC
  Administered 2020-09-01 – 2020-09-21 (×21): 75 ug
  Filled 2020-09-01 (×21): qty 1

## 2020-09-01 MED ORDER — FENTANYL 2500MCG IN NS 250ML (10MCG/ML) PREMIX INFUSION
50.0000 ug/h | INTRAVENOUS | Status: DC
  Administered 2020-09-01: 125 ug/h via INTRAVENOUS
  Administered 2020-09-01 – 2020-09-06 (×3): 50 ug/h via INTRAVENOUS
  Administered 2020-09-07: 100 ug/h via INTRAVENOUS
  Administered 2020-09-08 – 2020-09-09 (×2): 75 ug/h via INTRAVENOUS
  Administered 2020-09-09 – 2020-09-10 (×2): 50 ug/h via INTRAVENOUS
  Administered 2020-09-12: 75 ug/h via INTRAVENOUS
  Administered 2020-09-13: 100 ug/h via INTRAVENOUS
  Administered 2020-09-14: 150 ug/h via INTRAVENOUS
  Administered 2020-09-15: 200 ug/h via INTRAVENOUS
  Administered 2020-09-16: 75 ug/h via INTRAVENOUS
  Administered 2020-09-17: 100 ug/h via INTRAVENOUS
  Administered 2020-09-17: 150 ug/h via INTRAVENOUS
  Administered 2020-09-19 – 2020-09-20 (×2): 100 ug/h via INTRAVENOUS
  Filled 2020-09-01 (×19): qty 250

## 2020-09-01 MED ORDER — CHLORHEXIDINE GLUCONATE 0.12% ORAL RINSE (MEDLINE KIT)
15.0000 mL | Freq: Two times a day (BID) | OROMUCOSAL | Status: DC
  Administered 2020-09-01 – 2020-09-21 (×41): 15 mL via OROMUCOSAL

## 2020-09-01 MED ORDER — PANTOPRAZOLE SODIUM 40 MG PO PACK
40.0000 mg | PACK | Freq: Every day | ORAL | Status: DC
  Administered 2020-09-01 – 2020-09-21 (×21): 40 mg
  Filled 2020-09-01 (×21): qty 20

## 2020-09-01 NOTE — Progress Notes (Signed)
PT Cancellation Note  Patient Details Name: Aaron [name] MRN: [id number] DOB: [birth year]   Cancelled Treatment:    Reason Eval/Treat Not Completed: Medical issues which prohibited therapy - intubated, recent O2 desaturation event this am. RN requesting PT hold today, will check back tomorrow.  Richrd Sox, PT Acute Rehabilitation Services Pager [phone]  Office [phone]     Tyrone Apple D Despina Hidden 09/01/2020, 11:15 AM
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
## 2020-09-01 NOTE — TOC CAGE-AID Note (Signed)
Transition of Care [facility]) - CAGE-AID Screening   Patient Details  Name: Aaron [name] MRN: [id number] Date of Birth: 16-Dec-1965  Transition of [facility]) CM/SW Contact:    Jimmy Picket, LCSWA Phone Number: 09/01/2020, 10:47 AM   Clinical Narrative:  Pt unable to participate at this time due to pt being on ventilator.   CAGE-AID Screening: Substance Abuse Screening unable to be completed due to: : Patient unable to participate               Isabella Stalling Clinical Social Worker [phone]

## 2020-09-01 NOTE — Progress Notes (Addendum)
Contacted by patient's RN at 12:07 that patient is in acute respiratory distress with hypoxia into the 50s, unable to clear secretions, no improvement with nasotracheal suctioning or supplemental O2, worsening mentation. Reportedly had a few brief and less severe desats during the day responsive to supplemental O2 via facemask. Sats improved to 90% with bagmask ventilation. Patient reintubated by anesthesia, sats now 96%. Prelim chest xray no significant pneumothorax or hemothorax or collapse, ETT in good position, final read pending. Check abg in 30-45 min and then wean o2 as able. I updated his daughter by phone.

## 2020-09-01 NOTE — Progress Notes (Signed)
PHARMACY NOTE:  ANTIMICROBIAL RENAL DOSAGE ADJUSTMENT  Current antimicrobial regimen includes a mismatch between antimicrobial dosage and estimated renal function.  As per policy approved by the Pharmacy & Therapeutics and Medical Executive Committees, the antimicrobial dosage will be adjusted accordingly.  Current antimicrobial dosage:  Cefepime 2g IV every 12 hours  Indication: PNA  Renal Function:  Estimated Creatinine Clearance: 97.1 mL/min (by C-G formula based on SCr of 0.85 mg/dL). []      On intermittent HD, scheduled: []      On CRRT    Antimicrobial dosage has been changed to:  Cefepime 2g IV every 8 hours  Additional comments:   Thank you for allowing pharmacy to be a part of this patient's care.  , PharmD, BCPS Clinical Pharmacist Clinical phone for 09/01/2020: [phone] 09/01/2020 1:40 PM   **Pharmacist phone directory can now be found on [email or web address] ([email or web address]).  Listed under [facility].

## 2020-09-01 NOTE — Progress Notes (Signed)
Patient ID: Aaron [name], male   DOB: 01/19/1966, 54 y.o.   MRN: [id number] Had desat episode likely due to secretions. Bagged and suctioned and now sats 93. Increase PEEP to 10, add guaifenesin. CXR.  Violeta Gelinas, MD, MPH, FACS Please use [email or web address] to contact on call provider

## 2020-09-01 NOTE — Progress Notes (Signed)
Patient ID: Aaron [name], male   DOB: June 28, 1966, 53 y.o.   MRN: [id number] Follow up - Trauma Critical Care  Patient Details:    Aaron [name] is an 54 y.o. male.  Lines/tubes : Airway 7.5 mm (Active)  Secured at (cm) 25 cm 09/01/20 0831  Measured From Lips 09/01/20 0831  Secured Location Left 09/01/20 0831  Secured By [employer] 09/01/20 0831  Tube Holder Repositioned Yes 09/01/20 0831  Site Condition Dry 09/01/20 0831     CVC Double Lumen 09/09/2020 Right Internal jugular 16 cm (Active)  Indication for Insertion or Continuance of Line Vasoactive infusions 08/31/20 2000  Site Assessment Clean;Dry;Intact 08/31/20 2000  Proximal Lumen Status Infusing 08/31/20 2000  Distal Lumen Status Infusing 08/31/20 2000  Dressing Type Transparent;Occlusive 08/31/20 2000  Dressing Status Clean;Intact;Dry 08/31/20 2000  Antimicrobial disc in place? Yes 08/31/20 2000  Line Care Connections checked and tightened;Line pulled back 08/31/20 0800  Dressing Change Due 09/06/20 08/31/20 2000     NG/OG Tube Orogastric Xray Measured external length of tube (Active)  Site Assessment Clean;Dry;Intact 09/01/20 0500  Ongoing Placement Verification Xray 09/01/20 0500  Status Clamped 09/01/20 0500     Urethral Catheter JS Temperature probe 16 Fr. (Active)  Indication for Insertion or Continuance of Catheter Unstable spinal/crush injuries / Multisystem Trauma 08/31/20 2000  Site Assessment Clean;Intact 08/31/20 2000  Catheter Maintenance Bag below level of bladder;Catheter secured;Drainage bag/tubing not touching floor;Insertion date on drainage bag;No dependent loops;Seal intact 08/31/20 2000  Collection Container Standard drainage bag 08/31/20 2000  Securement Method Securing device (Describe) 08/31/20 2000  Urinary Catheter Interventions (if applicable) Unclamped 08/31/20 2000  Output (mL) 825 mL 09/01/20 0500    Microbiology/Sepsis markers: Results for orders placed or performed during the  hospital encounter of 09/20/2020  Respiratory Panel by RT PCR (Flu A&B, Covid) - Nasopharyngeal Swab     Status: None   Collection Time: 08/29/2020  1:42 AM   Specimen: Nasopharyngeal Swab  Result Value Ref Range Status   SARS Coronavirus 2 by RT PCR NEGATIVE NEGATIVE Final    Comment: (NOTE) SARS-CoV-2 target nucleic acids are NOT DETECTED.  The SARS-CoV-2 RNA is generally detectable in upper respiratoy specimens during the acute phase of infection. The lowest concentration of SARS-CoV-2 viral copies this assay can detect is 131 copies/mL. A negative result does not preclude SARS-Cov-2 infection and should not be used as the sole basis for treatment or other patient management decisions. A negative result may occur with  improper specimen collection/handling, submission of specimen other than nasopharyngeal swab, presence of viral mutation(s) within the areas targeted by this assay, and inadequate number of viral copies (<131 copies/mL). A negative result must be combined with clinical observations, patient history, and epidemiological information. The expected result is Negative.  Fact Sheet for Patients:  https://www.moore.com/  Fact Sheet for Healthcare Providers:  https://www.young.biz/  This test is no t yet approved or cleared by the [place] FDA and  has been authorized for detection and/or diagnosis of SARS-CoV-2 by FDA under an Emergency Use Authorization (EUA). This EUA will remain  in effect (meaning this test can be used) for the duration of the COVID-19 declaration under Section 564(b)(1) of the Act, 21 U.S.C. section 360bbb-3(b)(1), unless the authorization is terminated or revoked sooner.     Influenza A by PCR NEGATIVE NEGATIVE Final   Influenza B by PCR NEGATIVE NEGATIVE Final    Comment: (NOTE) The Xpert Xpress SARS-CoV-2/FLU/RSV assay is intended as an aid in  the diagnosis  of influenza from Nasopharyngeal swab  specimens and  should not be used as a sole basis for treatment. Nasal washings and  aspirates are unacceptable for Xpert Xpress SARS-CoV-2/FLU/RSV  testing.  Fact Sheet for Patients: https://www.moore.com/  Fact Sheet for Healthcare Providers: https://www.young.biz/  This test is not yet approved or cleared by the [place] FDA and  has been authorized for detection and/or diagnosis of SARS-CoV-2 by  FDA under an Emergency Use Authorization (EUA). This EUA will remain  in effect (meaning this test can be used) for the duration of the  Covid-19 declaration under Section 564(b)(1) of the Act, 21  U.S.C. section 360bbb-3(b)(1), unless the authorization is  terminated or revoked. Performed at [facility], [address], [place], [place] [address]   MRSA PCR Screening     Status: None   Collection Time: 09/08/2020 11:48 PM   Specimen: Nasal Mucosa; Nasopharyngeal  Result Value Ref Range Status   MRSA by PCR NEGATIVE NEGATIVE Final    Comment:        The GeneXpert MRSA Assay (FDA approved for NASAL specimens only), is one component of a comprehensive MRSA colonization surveillance program. It is not intended to diagnose MRSA infection nor to guide or monitor treatment for MRSA infections. Performed at [facility], [address], [place], [place] [address]     Anti-infectives:  Anti-infectives (From admission, onward)   Start     Dose/Rate Route Frequency Ordered Stop   09/01/20 1000  ceFEPIme (MAXIPIME) 2 g in sodium chloride 0.9 % 100 mL IVPB        2 g 200 mL/hr over 30 Minutes Intravenous Every 12 hours 09/01/20 7782       Consults: Treatment Team:  Jadene Pierini, MD    Subjective:    Overnight Issues:   Objective:  Vital signs for last 24 hours: Temp:  [99.4 F (37.4 C)-102 F (38.9 C)] 102 F (38.9 C) (10/12 0800) Pulse Rate:  [69-94] 71 (10/12 0831) Resp:  [10-24] 18 (10/12 0831) BP:  (95-139)/(50-72) 111/58 (10/12 0831) SpO2:  [90 %-100 %] 97 % (10/12 0831) FiO2 (%):  [50 %-100 %] 50 % (10/12 0831)  Hemodynamic parameters for last 24 hours:    Intake/Output from previous day: 10/11 0701 - 10/12 0700 In: 2471.8 [I.V.:2420.1; IV Piggyback:51.7] Out: 1625 [Urine:1625]  Intake/Output this shift: No intake/output data recorded.  Vent settings for last 24 hours: Vent Mode: PRVC FiO2 (%):  [50 %-100 %] 50 % Set Rate:  [16 bmp-18 bmp] 18 bmp Vt Set:  [510 mL] 510 mL PEEP:  [8 cmH20] 8 cmH20 Plateau Pressure:  [16 cmH20-23 cmH20] 23 cmH20  Physical Exam:  General: on vent Neuro: quad, shrugs shoulders HEENT/Neck: ETT and neck incisions Resp: rhonchi on L CVS: RRR GI: soft, NT Extremities: n oedema  Results for orders placed or performed during the hospital encounter of 08/22/2020 (from the past 24 hour(s))  I-STAT 7, (LYTES, BLD GAS, ICA, H+H)     Status: Abnormal   Collection Time: 09/01/20  1:51 AM  Result Value Ref Range   pH, Arterial 7.349 (L) 7.35 - 7.45   pCO2 arterial 53.1 (H) 32 - 48 mmHg   pO2, Arterial 189 (H) 83 - 108 mmHg   Bicarbonate 29.1 (H) 20.0 - 28.0 mmol/L   TCO2 31 22 - 32 mmol/L   O2 Saturation 100.0 %   Acid-Base Excess 3.0 (H) 0.0 - 2.0 mmol/L   Sodium 136 135 - 145  mmol/L   Potassium 4.4 3.5 - 5.1 mmol/L   Calcium, Ion 1.05 (L) 1.15 - 1.40 mmol/L   HCT 29.0 (L) 39 - 52 %   Hemoglobin 9.9 (L) 13.0 - 17.0 g/dL   Patient temperature 16.1 F    Collection site Radial    Drawn by RT    Sample type ARTERIAL   Triglycerides     Status: None   Collection Time: 09/01/20  4:06 AM  Result Value Ref Range   Triglycerides 56 <150 mg/dL  CBC     Status: Abnormal   Collection Time: 09/01/20  4:06 AM  Result Value Ref Range   WBC 9.4 4.0 - 10.5 K/uL   RBC 2.76 (L) 4.22 - 5.81 MIL/uL   Hemoglobin 8.8 (L) 13.0 - 17.0 g/dL   HCT 09.6 (L) 39 - 52 %   MCV 98.2 80.0 - 100.0 fL   MCH 31.9 26.0 - 34.0 pg   MCHC 32.5 30.0 - 36.0 g/dL   RDW  04.5 40.9 - 81.1 %   Platelets 96 (L) 150 - 400 K/uL   nRBC 0.0 0.0 - 0.2 %  Basic metabolic panel     Status: Abnormal   Collection Time: 09/01/20  4:06 AM  Result Value Ref Range   Sodium 136 135 - 145 mmol/L   Potassium 4.4 3.5 - 5.1 mmol/L   Chloride 101 98 - 111 mmol/L   CO2 27 22 - 32 mmol/L   Glucose, Bld 108 (H) 70 - 99 mg/dL   BUN 11 6 - 20 mg/dL   Creatinine, Ser 9.14 0.61 - 1.24 mg/dL   Calcium 7.4 (L) 8.9 - 10.3 mg/dL   GFR, Estimated >78 >29 mL/min   Anion gap 8 5 - 15  Magnesium     Status: None   Collection Time: 09/01/20  4:06 AM  Result Value Ref Range   Magnesium 1.7 1.7 - 2.4 mg/dL    Assessment & Plan: Present on Admission: . Quadriparesis (HCC)    LOS: 2 days   Additional comments:I reviewed the patient's new clinical lab test results. and CXR MCC C5 FX with quadriparesis - S/P C4-5 ACDF and C3-T2 PSIF by Dr. Maurice Small.  Acute hypoxic ventilator dependent respiratory failure - intubated overnight, poor pulmonary toilet due to cord injury. 50% and PEEP 8, RR increased this AM. ABG at 1200 Trace R PTX - no PTX on F/U CXR ID - fever, suspect PNA, resp CX and start empiric Maxipime Neurogenic shock - levo, albumin bolus FEN - start TF VTE - PAS, hold starting LMWH until PLTs over 100k Dispo - ICU, restarted home valproate, neurontin, synthroid, seroquel, effexor. Critical Care Total Time*:  Violeta Gelinas, MD, MPH, FACS Trauma & General Surgery Use [email or web address] to contact on call provider  09/01/2020  *Care during the described time interval was provided by me. I have reviewed this patient's available data, including medical history, events of note, physical examination and test results as part of my evaluation.

## 2020-09-01 NOTE — Progress Notes (Signed)
Neurosurgery Service Progress Note  Subjective: hypoxia into the 50s and unable to clear secretions- required intubation overnight, running fevers  Objective: Vitals:   09/01/20 0600 09/01/20 0700 09/01/20 0800 09/01/20 0831  BP: 131/72 116/61  (!) 111/58  Pulse: 74 72  71  Resp: 18 18  18   Temp:   (!) 102 F (38.9 C)   TempSrc:   Axillary   SpO2: 95% 95%  97%  Weight:      Height:       Temp (24hrs), Avg:100 F (37.8 C), Min:99.4 F (37.4 C), Max:102 F (38.9 C)  CBC Latest Ref Rng & Units 09/01/2020 09/01/2020 08/31/2020  WBC 4.0 - 10.5 K/uL 9.4 - 9.8  Hemoglobin 13.0 - 17.0 g/dL 8.8(L) 9.9(L) 9.5(L)  Hematocrit 39 - 52 % 27.1(L) 29.0(L) 29.6(L)  Platelets 150 - 400 K/uL 96(L) - 119(L)   BMP Latest Ref Rng & Units 09/01/2020 09/01/2020 08/31/2020  Glucose 70 - 99 mg/dL 108(H) - 117(H)  BUN 6 - 20 mg/dL 11 - 10  Creatinine 0.61 - 1.24 mg/dL 0.85 - 0.69  Sodium 135 - 145 mmol/L 136 136 138  Potassium 3.5 - 5.1 mmol/L 4.4 4.4 4.4  Chloride 98 - 111 mmol/L 101 - 106  CO2 22 - 32 mmol/L 27 - 25  Calcium 8.9 - 10.3 mg/dL 7.4(L) - 7.3(L)    Intake/Output Summary (Last 24 hours) at 09/01/2020 1102 Last data filed at 09/01/2020 0500 Gross per 24 hour  Intake 2003.22 ml  Output 825 ml  Net 1178.22 ml    Current Facility-Administered Medications:    0.9 % NaCl with KCl 20 mEq/ L  infusion, , Intravenous, Continuous, Georganna Skeans, MD, Last Rate: 100 mL/hr at 09/01/20 0500, Rate Verify at 09/01/20 0500   acetaminophen (TYLENOL) tablet 650 mg, 650 mg, Oral, Q4H PRN, Georganna Skeans, MD   albumin human 25 % solution 12.5 g, 12.5 g, Intravenous, Once, Georganna Skeans, MD   ceFEPIme (MAXIPIME) 2 g in sodium chloride 0.9 % 100 mL IVPB, 2 g, Intravenous, Q8H, Georganna Skeans, MD   chlorhexidine (PERIDEX) 0.12 % solution 15 mL, 15 mL, Mouth Rinse, BID, Judith Part, MD, 15 mL at 08/31/20 2116   chlorhexidine gluconate (MEDLINE KIT) (PERIDEX) 0.12 % solution 15 mL,  15 mL, Mouth Rinse, BID, Romana Juniper A, MD, 15 mL at 09/01/20 3299   Chlorhexidine Gluconate Cloth 2 % PADS 6 each, 6 each, Topical, Daily, Judith Part, MD, 6 each at 08/31/20 1405   feeding supplement (PROSource TF) liquid 45 mL, 45 mL, Per Tube, BID, Georganna Skeans, MD   feeding supplement (VITAL HIGH PROTEIN) liquid 1,000 mL, 1,000 mL, Per Tube, Q24H, Georganna Skeans, MD   fentaNYL (SUBLIMAZE) bolus via infusion 50 mcg, 50 mcg, Intravenous, Q15 min PRN, Romana Juniper A, MD   fentaNYL (SUBLIMAZE) injection 50 mcg, 50 mcg, Intravenous, Once, Romana Juniper A, MD   fentaNYL 2578mg in NS 2515m(1071mml) infusion-PREMIX, 50-200 mcg/hr, Intravenous, Continuous, ConRomana Juniper MD, Last Rate: 12.5 mL/hr at 09/01/20 0500, 125 mcg/hr at 09/01/20 0500   gabapentin (NEURONTIN) 250 MG/5ML solution 600 mg, 600 mg, Per Tube, Q8H, ThoGeorganna SkeansD   ipratropium-albuterol (DUONEB) 0.5-2.5 (3) MG/3ML nebulizer solution 3 mL, 3 mL, Nebulization, Q6H PRN, ThoGeorganna SkeansD, 3 mL at 08/31/20 1707   levothyroxine (SYNTHROID) tablet 75 mcg, 75 mcg, Per Tube, Q0600, ThoGeorganna SkeansD   MEDLINE mouth rinse, 15 mL, Mouth Rinse, 10 times per day, ConClovis RileyD, 15 mL at  09/01/20 0543   methocarbamol (ROBAXIN) 1,000 mg in dextrose 5 % 100 mL IVPB, 1,000 mg, Intravenous, Q8H PRN, Georganna Skeans, MD   morphine 4 MG/ML injection 4 mg, 4 mg, Intravenous, Q2H PRN, Georganna Skeans, MD, 4 mg at 08/31/20 2007   norepinephrine (LEVOPHED) 51m in 2549mpremix infusion, 0-40 mcg/min, Intravenous, Titrated, [place], AyMontel CulverMD, Last Rate: 3.75 mL/hr at 09/01/20 0500, 1 mcg/min at 09/01/20 0500   ondansetron (ZOFRAN-ODT) disintegrating tablet 4 mg, 4 mg, Oral, Q6H PRN **OR** ondansetron (ZOFRAN) injection 4 mg, 4 mg, Intravenous, Q6H PRN, ThGeorganna SkeansMD   oxyCODONE (Oxy IR/ROXICODONE) immediate release tablet 10 mg, 10 mg, Oral, Q4H PRN, ThGeorganna SkeansMD, 10 mg at 08/31/20  1930   oxyCODONE (Oxy IR/ROXICODONE) immediate release tablet 5 mg, 5 mg, Oral, Q4H PRN, ThGeorganna SkeansMD   pantoprazole sodium (PROTONIX) 40 mg/20 mL oral suspension 40 mg, 40 mg, Per Tube, Daily, ThGeorganna SkeansMD   propofol (DIPRIVAN) 1000 MG/100ML infusion, 0-50 mcg/kg/min, Intravenous, Continuous, CoRomana Juniper, MD, Last Rate: 11.57 mL/hr at 09/01/20 0731, 25 mcg/kg/min at 09/01/20 0731   QUEtiapine (SEROQUEL) tablet 600 mg, 600 mg, Per Tube, QHS, ThGeorganna SkeansMD   sodium chloride flush (NS) 0.9 % injection 10-40 mL, 10-40 mL, Intracatheter, Q12H, ThGeorganna SkeansMD   sodium chloride flush (NS) 0.9 % injection 10-40 mL, 10-40 mL, Intracatheter, PRN, ThGeorganna SkeansMD   traMADol (UVeatrice Bourbontablet 50 mg, 50 mg, Oral, Q6H, ThGeorganna SkeansMD, 50 mg at 08/31/20 2311   valproic acid (DEPAKENE) 250 MG/5ML solution 500 mg, 500 mg, Per Tube, QID, ThGeorganna SkeansMD   venlafaxine (ETemec[facility] Asc [facility] D[facility] Center Murrietatablet 75 mg, 75 mg, Per Tube, BID, ThGeorganna SkeansMD   Physical Exam: Intubated, eyes opening spontaneous,  Strength exam:  L bicep 3/5, 0/5 on R, 0/5 BLE, areflexic in BLE Anterior neck incision: c/d/i Posterior neck incision: serosanguineous drainage to the inferior aspect of the wound-controlled  Assessment & Plan: [age].[facility]. [facility] severe hyperextension injury s/p C4-5 ACDF and C3-T2 posterior instrumented fusion  -no cervical collar needed -okay for DVT chemoPPx today   KiOsie CheeksNP 11:00 am

## 2020-09-01 NOTE — Progress Notes (Signed)
OT Cancellation Note  Patient Details Name: Mathews Stuhr MRN: [id number] DOB: 15-Jun-1966   Cancelled Treatment:    Reason Eval/Treat Not Completed: Medical issues which prohibited therapy; pt re-intubated last night and remains with tenuous pulmonary status. Will follow up for OT eval as able.  Marcy Siren, OT Acute Rehabilitation Services Pager [phone] Office [phone]   [name] 09/01/2020, 11:13 AM

## 2020-09-02 ENCOUNTER — Inpatient Hospital Stay (HOSPITAL_COMMUNITY): Payer: Medicare Other

## 2020-09-02 LAB — GLUCOSE, CAPILLARY
Glucose-Capillary: 120 mg/dL — ABNORMAL HIGH (ref 70–99)
Glucose-Capillary: 136 mg/dL — ABNORMAL HIGH (ref 70–99)

## 2020-09-02 LAB — POCT I-STAT 7, (LYTES, BLD GAS, ICA,H+H)
Acid-Base Excess: 4 mmol/L — ABNORMAL HIGH (ref 0.0–2.0)
Bicarbonate: 29.3 mmol/L — ABNORMAL HIGH (ref 20.0–28.0)
Calcium, Ion: 1.09 mmol/L — ABNORMAL LOW (ref 1.15–1.40)
HCT: 25 % — ABNORMAL LOW (ref 39.0–52.0)
Hemoglobin: 8.5 g/dL — ABNORMAL LOW (ref 13.0–17.0)
O2 Saturation: 99 %
Patient temperature: 98.6
Potassium: 4.4 mmol/L (ref 3.5–5.1)
Sodium: 139 mmol/L (ref 135–145)
TCO2: 31 mmol/L (ref 22–32)
pCO2 arterial: 48.8 mmHg — ABNORMAL HIGH (ref 32.0–48.0)
pH, Arterial: 7.386 (ref 7.350–7.450)
pO2, Arterial: 133 mmHg — ABNORMAL HIGH (ref 83.0–108.0)

## 2020-09-02 LAB — TRIGLYCERIDES: Triglycerides: 45 mg/dL (ref <150)

## 2020-09-02 LAB — BASIC METABOLIC PANEL
Anion gap: 5 (ref 5–15)
BUN: 20 mg/dL (ref 6–20)
CO2: 27 mmol/L (ref 22–32)
Calcium: 7.2 mg/dL — ABNORMAL LOW (ref 8.9–10.3)
Chloride: 106 mmol/L (ref 98–111)
Creatinine, Ser: 0.86 mg/dL (ref 0.61–1.24)
GFR, Estimated: >60 mL/min (ref >60)
Glucose, Bld: 133 mg/dL — ABNORMAL HIGH (ref 70–99)
Potassium: 4.5 mmol/L (ref 3.5–5.1)
Sodium: 138 mmol/L (ref 135–145)

## 2020-09-02 LAB — CBC
HCT: 26.5 % — ABNORMAL LOW (ref 39.0–52.0)
Hemoglobin: 8.4 g/dL — ABNORMAL LOW (ref 13.0–17.0)
MCH: 31.6 pg (ref 26.0–34.0)
MCHC: 31.7 g/dL (ref 30.0–36.0)
MCV: 99.6 fL (ref 80.0–100.0)
Platelets: 60 10*3/uL — ABNORMAL LOW (ref 150–400)
RBC: 2.66 MIL/uL — ABNORMAL LOW (ref 4.22–5.81)
RDW: 13.8 % (ref 11.5–15.5)
WBC: 8.2 10*3/uL (ref 4.0–10.5)
nRBC: 0 % (ref 0.0–0.2)

## 2020-09-02 LAB — PHOSPHORUS
Phosphorus: 1.2 mg/dL — ABNORMAL LOW (ref 2.5–4.6)
Phosphorus: 2.7 mg/dL (ref 2.5–4.6)

## 2020-09-02 LAB — MAGNESIUM
Magnesium: 2.6 mg/dL — ABNORMAL HIGH (ref 1.7–2.4)
Magnesium: 2.6 mg/dL — ABNORMAL HIGH (ref 1.7–2.4)

## 2020-09-02 MED ORDER — LACTATED RINGERS IV BOLUS
1000.0000 mL | Freq: Once | INTRAVENOUS | Status: AC
  Administered 2020-09-02: 1000 mL via INTRAVENOUS

## 2020-09-02 MED ORDER — OXYCODONE HCL 5 MG PO TABS
5.0000 mg | ORAL_TABLET | ORAL | Status: DC | PRN
  Administered 2020-09-02 – 2020-09-19 (×17): 10 mg
  Filled 2020-09-02 (×19): qty 2

## 2020-09-02 MED ORDER — SODIUM PHOSPHATES 45 MMOLE/15ML IV SOLN
45.0000 mmol | Freq: Once | INTRAVENOUS | Status: AC
  Administered 2020-09-02: 45 mmol via INTRAVENOUS
  Filled 2020-09-02: qty 15

## 2020-09-02 MED ORDER — OXYCODONE HCL 5 MG PO TABS
10.0000 mg | ORAL_TABLET | ORAL | Status: DC | PRN
  Administered 2020-09-02: 10 mg

## 2020-09-02 MED ORDER — OXYCODONE HCL 5 MG PO TABS: 5.0000 mg | ORAL_TABLET | ORAL | Status: DC | PRN

## 2020-09-02 NOTE — Evaluation (Signed)
Occupational Therapy Evaluation Patient Details Name: Aaron [name] MRN: [id number] DOB: [birth year] Today's Date: 09/02/2020    History of Present Illness This 54 y.o. male admitted after [facility] - no LOC, but unable to move Bil. UEs and LEs.  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  He underwent C4-5 ACDF, C3,C4,C5, T1,T2 laminectomies, C3-T2 PLIF.  Pt remained intubated post op.  Pt  PMH includes: Bipolar disorder, HTN; s/p ACDF C5-C7   Clinical Impression   Pt admitted with above. He demonstrates the below listed deficits and will benefit from continued OT to maximize safety and independence with BADLs.  Pt presents to OT with quaraplegia/paresis - demonstrates bil. Shoulder shrug and 1/5 biceps on Lt; decreased arousal/cognition, decreased activity tolerance, no response to pain all 4 extremities.  He was seen in conjunction with PT and tolerated chair position ~12 mins with stable Vitals.  He aroused briefly opening eyes, and did attempt to follow commands with Lt UE before drifting back to sleep.  Nsg reports pt has 4 children, but is not married.  Unsure of PLOF.  He will likely require extensive post acute rehab.  Will follow acutely.       Follow Up Recommendations  CIR;Supervision/Assistance - 24 hour    Equipment Recommendations  None recommended by OT    Recommendations for Other Services Rehab consult     Precautions / Restrictions Precautions Precautions: Cervical;Other (comment) Precaution Comments: ETT       Mobility Bed Mobility Overal bed mobility: Needs Assistance Bed Mobility: Rolling Rolling: Total assist;+2 for physical assistance;+2 for safety/equipment         General bed mobility comments: pt unable to assist   Transfers                 General transfer comment: pt unable     Balance                                           ADL either  performed or assessed with clinical judgement   ADL Overall ADL's : Needs assistance/impaired                                       General ADL Comments: Pt is dependent with all aspects of ADLs - unable to assist      Vision   Additional Comments: unable to assess due to decreased arousal      Perception Perception Comments: unable to assess    Praxis Praxis Praxis-Other Comments: unable to assess     Pertinent Vitals/Pain Pain Assessment: Faces Faces Pain Scale: Hurts little more Pain Location: generalized discomfort likely due to ETT  Pain Descriptors / Indicators: Restless Pain Intervention(s): Monitored during session     Hand Dominance  (uncertain )   Extremity/Trunk Assessment Upper Extremity Assessment Upper Extremity Assessment: RUE deficits/detail;LUE deficits/detail RUE Deficits / Details: No AROM noted with exception of shoulder spontaneous shoulder shrug.  Does not respond to painful stimuli.  PROM WFL  RUE Sensation: decreased light touch RUE Coordination: decreased gross motor;decreased fine motor LUE Deficits / Details: Pt with 1/5 biceps, and at least 2/5 shoulder shrug.  No other movement noted.  does not respond to painful  stimuli.  PROM WFL with exception of limitations of shoulder to ~90* due to vent tubing  LUE Sensation: decreased light touch LUE Coordination: decreased fine motor;decreased gross motor   Lower Extremity Assessment Lower Extremity Assessment: Defer to PT evaluation   Cervical / Trunk Assessment Cervical / Trunk Assessment: Other exceptions Cervical / Trunk Exceptions: Pt keeps head rotated to Lt due to ETT placement.  s/p ACDF and PLIF cervical spine    Communication Communication Communication: Other (comment) (ETT )   Cognition Arousal/Alertness: Lethargic Behavior During Therapy: Flat affect Overall Cognitive Status: Impaired/Different from baseline Area of Impairment: Attention;Following commands                    Current Attention Level: Focused   Following Commands: Follows one step commands inconsistently;Follows one step commands with increased time       General Comments: Pt lethargic, but aroused briefly when HOB elevated.  He was able to assist minimally with Lt elbow flexion but followed no other commands, and then fell back to sleep.  Unsure if he was attempting to follow more commands and couldn't due to SCI, or if he was not able due to cognitive impairment/arousal    General Comments  BP supine 112/66; BP in chair position 110/57; 02 sats 92% with HR 69.  Pt on 50% FI02, 10 PEEP     Exercises     Shoulder Instructions      Home Living Family/patient expects to be discharged to:: Unsure                                 Additional Comments: Per RN, pt not married, has 4 children, but unsure of home situation       Prior Functioning/Environment          Comments: Pt unable to provide info, but assume he was independent with ADLs         OT Problem List: Decreased strength;Decreased range of motion;Decreased activity tolerance;Impaired balance (sitting and/or standing);Decreased coordination;Decreased cognition;Decreased safety awareness;Decreased knowledge of use of DME or AE;Decreased knowledge of precautions;Cardiopulmonary status limiting activity;Impaired tone;Impaired sensation;Impaired UE functional use      OT Treatment/Interventions: Self-care/ADL training;Therapeutic exercise;Neuromuscular education;DME and/or AE instruction;Manual therapy;Splinting;Therapeutic activities;Cognitive remediation/compensation;Patient/family education;Balance training    OT Goals(Current goals can be found in the care plan section) Acute Rehab OT Goals OT Goal Formulation: Patient unable to participate in goal setting Time For Goal Achievement: 09/16/20 Potential to Achieve Goals: Good ADL Goals Additional ADL Goal #1: Pt will tolerate upright  posture/sitting x 10 mins with stable vitals in prep for ADLs/functional transfers Additional ADL Goal #2: Pt will consistently maintain arousal during therapy sessions Additional ADL Goal #3: family will be independent with PROM bil. UEs Additional ADL Goal #4: Will use soft touch call bell with Lt UE and mod A  OT Frequency: Min 2X/week   Barriers to D/C: Decreased caregiver support  uncertain of support at discharge        Co-evaluation PT/OT/SLP Co-Evaluation/Treatment: Yes Reason for Co-Treatment: Complexity of the patient's impairments (multi-system involvement);For patient/therapist safety   OT goals addressed during session: Strengthening/ROM      AM-PAC OT "6 Clicks" Daily Activity     Outcome Measure Help from another person eating meals?: Total Help from another person taking care of personal grooming?: Total Help from another person toileting, which includes using toliet, bedpan, or urinal?: Total Help from another person  bathing (including washing, rinsing, drying)?: Total Help from another person to put on and taking off regular upper body clothing?: Total Help from another person to put on and taking off regular lower body clothing?: Total 6 Click Score: 6   End of Session Equipment Utilized During Treatment: Oxygen Nurse Communication: Mobility status  Activity Tolerance: Patient limited by lethargy Patient left: in bed;with call bell/phone within reach;with nursing/sitter in room  OT Visit Diagnosis: Muscle weakness (generalized) (M62.81)                Time: 1683-7290 OT Time Calculation (min): 17 min Charges:  OT General Charges $OT Visit: 1 Visit OT Evaluation $OT Eval High Complexity: 1 High  Eber Jones., OTR/L Acute Rehabilitation Services Pager [phone] Office [phone]   Jeani Hawking M 09/02/2020, 10:18 AM

## 2020-09-02 NOTE — Progress Notes (Signed)
RT called by RN to bedside due to patient sats in 80's. RT bagged patient with PEEP valve at 10 until sats returned to mid 90's. RN at bedside making MD aware. RT will continue to monitor as needed.

## 2020-09-02 NOTE — Progress Notes (Signed)
Pt desatted to 80s. Bag lavaged by RT. Diminished BS on rt. MD notified, orders received. RN and RT at beside.

## 2020-09-02 NOTE — Progress Notes (Signed)
Posterior incision with large amount of clear/serous drainage. Dr Maurice Small notified. Dressing changed, site stopped oozing. Will cont to monitor.

## 2020-09-02 NOTE — Evaluation (Signed)
Physical Therapy Evaluation Patient Details Name: Aaron [name] MRN: [id number] DOB: [birth year] Today's Date: 09/02/2020   History of Present Illness  This 53 y.o. male admitted after [facility] - no LOC, but unable to move Bil. UEs and LEs.  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  He underwent C4-5 ACDF, C3,C4,C5, T1,T2 laminectomies, C3-T2 PLIF.  Pt remained intubated post op.  Pt  PMH includes: Bipolar disorder, HTN; s/p ACDF C5-C7  Clinical Impression  Pt presents to PT with deficits in strength, power, cognition, functional mobility, level of arousal. Pt is lethargic for much of session, does not consistently follow commands. Pt demonstrates minimal LUE bicep strength but not AROM noted in any other extremity at this time. Pt remains flaccid in BLE and RUE at this time. Pt will benefit from contined acute PT POC to improve activity tolerance and mobility quality while reducing caregiver burden. PT recommends CIR at this time however the pt will need to demonstrate improved participation in PT sessions moving forward.    Follow Up Recommendations CIR (pending improvement in activity tolerance)    Equipment Recommendations  Wheelchair (measurements PT);Wheelchair cushion (measurements PT);Hospital bed    Recommendations for Other Services Rehab consult     Precautions / Restrictions Precautions Precautions: Cervical;Other (comment) Precaution Booklet Issued: No Precaution Comments: ETT  Restrictions Weight Bearing Restrictions: No      Mobility  Bed Mobility Overal bed mobility: Needs Assistance Bed Mobility: Rolling Rolling: Total assist;+2 for physical assistance;+2 for safety/equipment         General bed mobility comments: pt unable to assist   Transfers                 General transfer comment: pt unable   Ambulation/Gait                Stairs             Wheelchair Mobility    Modified Rankin (Stroke Patients Only)       Balance                                             Pertinent Vitals/Pain Pain Assessment: Faces Faces Pain Scale: Hurts little more Pain Location: generalized discomfort likely due to ETT  Pain Descriptors / Indicators: Restless Pain Intervention(s): Monitored during session    Home Living Family/patient expects to be discharged to:: Unsure                 Additional Comments: Per RN, pt not married, has 4 children, but unsure of home situation     Prior Function           Comments: Pt unable to provide info, but assume he was independent with ADLs      Hand Dominance   Dominant Hand:  (uncertain )    Extremity/Trunk Assessment   Upper Extremity Assessment Upper Extremity Assessment: RUE deficits/detail;LUE deficits/detail RUE Deficits / Details: No AROM noted with exception of shoulder spontaneous shoulder shrug.  Does not respond to painful stimuli.  PROM WFL  RUE Sensation: decreased light touch RUE Coordination: decreased gross motor;decreased fine motor LUE Deficits / Details: Pt with 1/5 biceps, and at least 2/5 shoulder shrug.  No other movement noted.  does not respond to painful stimuli.  PROM WFL with exception of limitations of shoulder to ~90* due to vent tubing  LUE Sensation: decreased light touch LUE Coordination: decreased fine motor;decreased gross motor    Lower Extremity Assessment Lower Extremity Assessment: Defer to PT evaluation RLE Deficits / Details: flaccid, PROM WFL, no response to pain noted LLE Deficits / Details: flaccid, PROM WFL, no response to pain noted    Cervical / Trunk Assessment Cervical / Trunk Assessment: Other exceptions Cervical / Trunk Exceptions: Pt keeps head rotated to Lt due to ETT placement.  s/p ACDF and PLIF cervical spine   Communication   Communication: Other (comment) (ETT )  Cognition Arousal/Alertness:  Lethargic Behavior During Therapy: Flat affect Overall Cognitive Status: Impaired/Different from baseline Area of Impairment: Attention;Following commands                   Current Attention Level: Focused   Following Commands: Follows one step commands inconsistently;Follows one step commands with increased time       General Comments: Pt lethargic, but aroused briefly when HOB elevated.  He was able to assist minimally with Lt elbow flexion but followed no other commands, and then fell back to sleep.  Unsure if he was attempting to follow more commands and couldn't due to SCI, or if he was not able due to cognitive impairment/arousal       General Comments General comments (skin integrity, edema, etc.): BP supine 112/66; BP in chair position 110/57; 02 sats 92% with HR 69.  Pt on 50% FI02, 10 PEEP     Exercises Other Exercises Other Exercises: PROM in BLE 5 reps of ankle, knee, hip   Assessment/Plan    PT Assessment Patient needs continued PT services  PT Problem List Decreased strength;Decreased activity tolerance;Decreased balance;Decreased mobility;Decreased coordination;Decreased cognition;Decreased knowledge of use of DME;Decreased safety awareness;Decreased knowledge of precautions;Impaired tone;Impaired sensation       PT Treatment Interventions DME instruction;Functional mobility training;Therapeutic activities;Therapeutic exercise;Balance training;Neuromuscular re-education;Cognitive remediation;Patient/family education;Wheelchair mobility training    PT Goals (Current goals can be found in the Care Plan section)  Acute Rehab PT Goals Patient Stated Goal: pt unable to state, PT goal to improve mobility quality and reduce caregiver burden PT Goal Formulation: Patient unable to participate in goal setting Time For Goal Achievement: 09/16/20 Potential to Achieve Goals: Fair    Frequency Min 3X/week   Barriers to discharge        Co-evaluation PT/OT/SLP  Co-Evaluation/Treatment: Yes Reason for Co-Treatment: Complexity of the patient's impairments (multi-system involvement);For patient/therapist safety PT goals addressed during session: Mobility/safety with mobility;Balance;Strengthening/ROM OT goals addressed during session: Strengthening/ROM       AM-PAC PT "6 Clicks" Mobility  Outcome Measure Help needed turning from your back to your side while in a flat bed without using bedrails?: Total Help needed moving from lying on your back to sitting on the side of a flat bed without using bedrails?: Total Help needed moving to and from a bed to a chair (including a wheelchair)?: Total Help needed standing up from a chair using your arms (e.g., wheelchair or bedside chair)?: Total Help needed to walk in hospital room?: Total Help needed climbing 3-5 steps with a railing? : Total 6 Click Score: 6    End of Session Equipment Utilized During Treatment: Oxygen (ETT) Activity Tolerance: Patient limited by lethargy Patient left: in bed;with nursing/sitter in room;with bed alarm set Nurse Communication: Mobility status;Need for lift equipment PT Visit Diagnosis: Other abnormalities of gait and mobility (R26.89);Muscle weakness (generalized) (M62.81);Other  symptoms and signs involving the nervous system (R29.898)    Time: 7989-2119 PT Time Calculation (min) (ACUTE ONLY): 16 min   Charges:   PT Evaluation $PT Eval Moderate Complexity: 1 Mod          Arlyss Gandy, PT, DPT Acute Rehabilitation Pager: [phone]   Arlyss Gandy 09/02/2020, 10:34 AM
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
## 2020-09-02 NOTE — Progress Notes (Signed)
Trauma/Critical Care Follow Up Note  Subjective:    Overnight Issues:   Objective:  Vital signs for last 24 hours: Temp:  [98.6 F (37 C)-100.8 F (38.2 C)] 98.6 F (37 C) (10/13 0400) Pulse Rate:  [65-77] 69 (10/13 1112) Resp:  [5-20] 20 (10/13 1112) BP: (97-126)/(51-59) 109/53 (10/13 1112) SpO2:  [93 %-100 %] 98 % (10/13 1112) FiO2 (%):  [50 %-70 %] 50 % (10/13 1112) Weight:  [88.1 kg] 88.1 kg (10/13 0500)  Hemodynamic parameters for last 24 hours:    Intake/Output from previous day: 10/12 0701 - 10/13 0700 In: 4062.4 [I.V.:2566.6; NG/GT:892.7; IV Piggyback:603.1] Out: 1800 [Urine:1800]  Intake/Output this shift: No intake/output data recorded.  Vent settings for last 24 hours: Vent Mode: PRVC FiO2 (%):  [50 %-70 %] 50 % Set Rate:  [18 bmp-20 bmp] 20 bmp Vt Set:  [510 mL] 510 mL PEEP:  [10 cmH20] 10 cmH20 Plateau Pressure:  [18 cmH20-25 cmH20] 18 cmH20  Physical Exam:  Gen: comfortable, no distress Neuro: grossly non-focal, does not follow commands, very sleepy despite sedation being off for an hour HEENT: intubated, anterior surgical wound c/d/i with dermabond Neck: c-collar in place CV: RRR Pulm: unlabored breathing, mechanically ventilated Abd: soft, nontender GU: clear, yellow urine Extr: wwp, no edema   Results for orders placed or performed during the hospital encounter of 09/20/20 (from the past 24 hour(s))  I-STAT 7, (LYTES, BLD GAS, ICA, H+H)     Status: Abnormal   Collection Time: 09/01/20 12:09 PM  Result Value Ref Range   pH, Arterial 7.371 7.35 - 7.45   pCO2 arterial 49.8 (H) 32 - 48 mmHg   pO2, Arterial 106 83 - 108 mmHg   Bicarbonate 28.7 (H) 20.0 - 28.0 mmol/L   TCO2 30 22 - 32 mmol/L   O2 Saturation 98.0 %   Acid-Base Excess 3.0 (H) 0.0 - 2.0 mmol/L   Sodium 138 135 - 145 mmol/L   Potassium 4.3 3.5 - 5.1 mmol/L   Calcium, Ion 1.04 (L) 1.15 - 1.40 mmol/L   HCT 27.0 (L) 39 - 52 %   Hemoglobin 9.2 (L) 13.0 - 17.0 g/dL   Patient  temperature 99.4 F    Collection site Radial    Drawn by Operator    Sample type ARTERIAL   Culture, respiratory (non-expectorated)     Status: None (Preliminary result)   Collection Time: 09/01/20  1:50 PM   Specimen: Tracheal Aspirate; Respiratory  Result Value Ref Range   Specimen Description TRACHEAL ASPIRATE    Special Requests NONE    Gram Stain      FEW WBC PRESENT,BOTH PMN AND MONONUCLEAR RARE GRAM POSITIVE COCCI    Culture      CULTURE REINCUBATED FOR BETTER GROWTH Performed at [facility], [address] [place], [place] [address]    Report Status PENDING   Magnesium     Status: None   Collection Time: 09/01/20  5:22 PM  Result Value Ref Range   Magnesium 1.9 1.7 - 2.4 mg/dL  Phosphorus     Status: Abnormal   Collection Time: 09/01/20  5:22 PM  Result Value Ref Range   Phosphorus 1.3 (L) 2.5 - 4.6 mg/dL  Triglycerides     Status: None   Collection Time: 09/02/20  3:42 AM  Result Value Ref Range   Triglycerides 45 <150 mg/dL  CBC     Status: Abnormal   Collection Time: 09/02/20  3:42 AM  Result Value Ref Range  WBC 8.2 4.0 - 10.5 K/uL   RBC 2.66 (L) 4.22 - 5.81 MIL/uL   Hemoglobin 8.4 (L) 13.0 - 17.0 g/dL   HCT 15.4 (L) 39 - 52 %   MCV 99.6 80.0 - 100.0 fL   MCH 31.6 26.0 - 34.0 pg   MCHC 31.7 30.0 - 36.0 g/dL   RDW 00.8 67.6 - 19.5 %   Platelets 60 (L) 150 - 400 K/uL   nRBC 0.0 0.0 - 0.2 %  Basic metabolic panel     Status: Abnormal   Collection Time: 09/02/20  3:42 AM  Result Value Ref Range   Sodium 138 135 - 145 mmol/L   Potassium 4.5 3.5 - 5.1 mmol/L   Chloride 106 98 - 111 mmol/L   CO2 27 22 - 32 mmol/L   Glucose, Bld 133 (H) 70 - 99 mg/dL   BUN 20 6 - 20 mg/dL   Creatinine, Ser 0.93 0.61 - 1.24 mg/dL   Calcium 7.2 (L) 8.9 - 10.3 mg/dL   GFR, Estimated >26 >71 mL/min   Anion gap 5 5 - 15  Magnesium     Status: Abnormal   Collection Time: 09/02/20  3:42 AM  Result Value Ref Range   Magnesium 2.6 (H) 1.7 - 2.4 mg/dL  Phosphorus      Status: Abnormal   Collection Time: 09/02/20  3:42 AM  Result Value Ref Range   Phosphorus 1.2 (L) 2.5 - 4.6 mg/dL  I-STAT 7, (LYTES, BLD GAS, ICA, H+H)     Status: Abnormal   Collection Time: 09/02/20  4:34 AM  Result Value Ref Range   pH, Arterial 7.386 7.35 - 7.45   pCO2 arterial 48.8 (H) 32 - 48 mmHg   pO2, Arterial 133 (H) 83 - 108 mmHg   Bicarbonate 29.3 (H) 20.0 - 28.0 mmol/L   TCO2 31 22 - 32 mmol/L   O2 Saturation 99.0 %   Acid-Base Excess 4.0 (H) 0.0 - 2.0 mmol/L   Sodium 139 135 - 145 mmol/L   Potassium 4.4 3.5 - 5.1 mmol/L   Calcium, Ion 1.09 (L) 1.15 - 1.40 mmol/L   HCT 25.0 (L) 39 - 52 %   Hemoglobin 8.5 (L) 13.0 - 17.0 g/dL   Patient temperature 24.5 F    Collection site Radial    Drawn by RT    Sample type ARTERIAL     Assessment & Plan: The plan of care was discussed with the bedside nurse for the day, Consuella Lose, who is in agreement with this plan and no additional concerns were raised.   Present on Admission: . Quadriparesis (HCC)    LOS: 3 days   Additional comments:I reviewed the patient's new clinical lab test results.   and I reviewed the patients new imaging test results.    [facility]  C5 FX with quadriparesis - S/P C4-5 ACDF and C3-T2 PSIF 10/10 by Dr. Maurice Small.  Acute hypoxic ventilator dependent respiratory failure - intubated overnight, poor pulmonary toilet due to cord injury. 50% and PEEP 10. Due to cord injury, expecting he will need a trach, will d/w family today. Trace R PTX - no PTX on F/U CXR Pre-existing medical problems - restarted home valproate, neurontin, synthroid, seroquel, effexor ID - AF in the last 24h, resp cx sent 10/12 with rare GPCs, on empiric Maxipime Neurogenic shock - off levo, resolved FEN - start TF, aggressively replete phos VTE - PAS, hold starting LMWH until PLTs over 100k Dispo - ICU  Lengthy discussion held with the  patient's daughter to provide clinical update and expectations of clinical course.  Communicated my  expection that he will need a tracheostomy and also began to explore whether the patient would want this or not. Offered palliative care services to aid in the discussion, which she declined. States she lives in [place] and will be driving down later today with expected arrival tomorrow AM. Plans to continue discussions with other family.   Critical Care Total Time: 80 minutes  Diamantina Monks, MD Trauma & General Surgery Please use [email or web address] to contact on call provider  09/02/2020  *Care during the described time interval was provided by me. I have reviewed this patient's available data, including medical history, events of note, physical examination and test results as part of my evaluation.

## 2020-09-02 NOTE — Progress Notes (Signed)
Inpatient Rehab Admissions Coordinator Note:   Per therapy recommendations, pt was screened for CIR candidacy by Megan Salon, MS CCC-SLP. At this time, Pt. Remains on ventilator, tolerating only bed-level therapies. I will not place  CIR consult at this time, but will follow for progress and place consult order once Pt. Has been extubated if he appears to be an appropriate candidate.  Please contact me with questions.   Megan Salon, MS, CCC-SLP Rehab Admissions Coordinator  [phone] (celll) [phone] (office)

## 2020-09-03 LAB — BASIC METABOLIC PANEL
Anion gap: 8 (ref 5–15)
BUN: 29 mg/dL — ABNORMAL HIGH (ref 6–20)
CO2: 24 mmol/L (ref 22–32)
Calcium: 7.6 mg/dL — ABNORMAL LOW (ref 8.9–10.3)
Chloride: 108 mmol/L (ref 98–111)
Creatinine, Ser: 0.64 mg/dL (ref 0.61–1.24)
GFR, Estimated: >60 mL/min (ref >60)
Glucose, Bld: 160 mg/dL — ABNORMAL HIGH (ref 70–99)
Potassium: 5.1 mmol/L (ref 3.5–5.1)
Sodium: 140 mmol/L (ref 135–145)

## 2020-09-03 LAB — CBC
HCT: 25.6 % — ABNORMAL LOW (ref 39.0–52.0)
Hemoglobin: 8.1 g/dL — ABNORMAL LOW (ref 13.0–17.0)
MCH: 31 pg (ref 26.0–34.0)
MCHC: 31.6 g/dL (ref 30.0–36.0)
MCV: 98.1 fL (ref 80.0–100.0)
Platelets: 68 10*3/uL — ABNORMAL LOW (ref 150–400)
RBC: 2.61 MIL/uL — ABNORMAL LOW (ref 4.22–5.81)
RDW: 14.2 % (ref 11.5–15.5)
WBC: 6.4 10*3/uL (ref 4.0–10.5)
nRBC: 0 % (ref 0.0–0.2)

## 2020-09-03 LAB — GLUCOSE, CAPILLARY
Glucose-Capillary: 151 mg/dL — ABNORMAL HIGH (ref 70–99)
Glucose-Capillary: 156 mg/dL — ABNORMAL HIGH (ref 70–99)
Glucose-Capillary: 135 mg/dL — ABNORMAL HIGH (ref 70–99)
Glucose-Capillary: 159 mg/dL — ABNORMAL HIGH (ref 70–99)
Glucose-Capillary: 131 mg/dL — ABNORMAL HIGH (ref 70–99)
Glucose-Capillary: 111 mg/dL — ABNORMAL HIGH (ref 70–99)

## 2020-09-03 LAB — MAGNESIUM: Magnesium: 2.8 mg/dL — ABNORMAL HIGH (ref 1.7–2.4)

## 2020-09-03 LAB — PHOSPHORUS: Phosphorus: 1.5 mg/dL — ABNORMAL LOW (ref 2.5–4.6)

## 2020-09-03 LAB — TRIGLYCERIDES: Triglycerides: 36 mg/dL (ref <150)

## 2020-09-03 MED ORDER — SODIUM CHLORIDE 0.9 % IV SOLN: INTRAVENOUS | Status: DC

## 2020-09-03 MED ORDER — ACETAZOLAMIDE 250 MG PO TABS
500.0000 mg | ORAL_TABLET | Freq: Two times a day (BID) | ORAL | Status: AC
  Administered 2020-09-03 – 2020-09-08 (×12): 500 mg
  Filled 2020-09-03 (×12): qty 2

## 2020-09-03 MED ORDER — ACETAZOLAMIDE ORAL SUSPENSION 25 MG/ML
500.0000 mg | Freq: Two times a day (BID) | ORAL | Status: DC
  Filled 2020-09-03: qty 20
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
## 2020-09-03 NOTE — Progress Notes (Signed)
Patient ID: Aaron [name], male   DOB: 27-Jul-1966, 54 y.o.   MRN: [id number] His daughter arrived from [place] and I met with her at the bedside. She is an Therapist, sports. She reports that when his Propofol was held Sheppard indicated he would want a trach. I updated her on his clinical situation and significant respiratory failure. He has sons that will be visiting as well. Georganna Skeans, MD, MPH, FACS Please use [email or web address] to contact on call provider

## 2020-09-03 NOTE — Progress Notes (Signed)
Patient ID: Aaron [name], male   DOB: [birth year], 54 y.o.   MRN: [id number] Follow up - Trauma Critical Care  Patient Details:    Aaron [name] is an 54 y.o. male.  Lines/tubes : Airway 7.5 mm (Active)  Secured at (cm) 25 cm 09/03/20 0802  Measured From Lips 09/03/20 0802  Secured Location Center 09/03/20 0802  Secured By [employer] 09/03/20 0802  Tube Holder Repositioned Yes 09/03/20 0802  Cuff Pressure (cm H2O) 28 cm H2O 09/02/20 0804  Site Condition Dry 09/03/20 0802     NG/OG Tube Orogastric Xray Measured external length of tube (Active)  Site Assessment Clean;Dry;Intact 09/03/20 0800  Ongoing Placement Verification No change in respiratory status;No acute changes, not attributed to clinical condition 09/03/20 0800  Status Infusing tube feed 09/03/20 0800    Microbiology/Sepsis markers: Results for orders placed or performed during the hospital encounter of 09/25/20  Respiratory Panel by RT PCR (Flu A&B, Covid) - Nasopharyngeal Swab     Status: None   Collection Time: Sep 25, 2020  1:42 AM   Specimen: Nasopharyngeal Swab  Result Value Ref Range Status   SARS Coronavirus 2 by RT PCR NEGATIVE NEGATIVE Final    Comment: (NOTE) SARS-CoV-2 target nucleic acids are NOT DETECTED.  The SARS-CoV-2 RNA is generally detectable in upper respiratoy specimens during the acute phase of infection. The lowest concentration of SARS-CoV-2 viral copies this assay can detect is 131 copies/mL. A negative result does not preclude SARS-Cov-2 infection and should not be used as the sole basis for treatment or other patient management decisions. A negative result may occur with  improper specimen collection/handling, submission of specimen other than nasopharyngeal swab, presence of viral mutation(s) within the areas targeted by this assay, and inadequate number of viral copies (<131 copies/mL). A negative result must be combined with clinical observations, patient history, and  epidemiological information. The expected result is Negative.  Fact Sheet for Patients:  https://www.moore.com/  Fact Sheet for Healthcare Providers:  https://www.young.biz/  This test is no t yet approved or cleared by the [place] FDA and  has been authorized for detection and/or diagnosis of SARS-CoV-2 by FDA under an Emergency Use Authorization (EUA). This EUA will remain  in effect (meaning this test can be used) for the duration of the COVID-19 declaration under Section 564(b)(1) of the Act, 21 U.S.C. section 360bbb-3(b)(1), unless the authorization is terminated or revoked sooner.     Influenza A by PCR NEGATIVE NEGATIVE Final   Influenza B by PCR NEGATIVE NEGATIVE Final    Comment: (NOTE) The Xpert Xpress SARS-CoV-2/FLU/RSV assay is intended as an aid in  the diagnosis of influenza from Nasopharyngeal swab specimens and  should not be used as a sole basis for treatment. Nasal washings and  aspirates are unacceptable for Xpert Xpress SARS-CoV-2/FLU/RSV  testing.  Fact Sheet for Patients: https://www.moore.com/  Fact Sheet for Healthcare Providers: https://www.young.biz/  This test is not yet approved or cleared by the [place] FDA and  has been authorized for detection and/or diagnosis of SARS-CoV-2 by  FDA under an Emergency Use Authorization (EUA). This EUA will remain  in effect (meaning this test can be used) for the duration of the  Covid-19 declaration under Section 564(b)(1) of the Act, 21  U.S.C. section 360bbb-3(b)(1), unless the authorization is  terminated or revoked. Performed at [facility], [address] [place], [place] [address]   MRSA PCR Screening     Status: None   Collection Time: 09/25/2020 11:48 PM  Specimen: Nasal Mucosa; Nasopharyngeal  Result Value Ref Range Status   MRSA by PCR NEGATIVE NEGATIVE Final    Comment:        The GeneXpert MRSA  Assay (FDA approved for NASAL specimens only), is one component of a comprehensive MRSA colonization surveillance program. It is not intended to diagnose MRSA infection nor to guide or monitor treatment for MRSA infections. Performed at [facility], [address] [place], [place] [address]   Culture, respiratory (non-expectorated)     Status: None (Preliminary result)   Collection Time: 09/01/20  1:50 PM   Specimen: Tracheal Aspirate; Respiratory  Result Value Ref Range Status   Specimen Description TRACHEAL ASPIRATE  Final   Special Requests NONE  Final   Gram Stain   Final    FEW WBC PRESENT,BOTH PMN AND MONONUCLEAR RARE GRAM POSITIVE COCCI    Culture   Final    CULTURE REINCUBATED FOR BETTER GROWTH Performed at [facility], [address], [place], [place] [address]    Report Status PENDING  Incomplete    Anti-infectives:  Anti-infectives (From admission, onward)   Start     Dose/Rate Route Frequency Ordered Stop   09/01/20 1000  ceFEPIme (MAXIPIME) 2 g in sodium chloride 0.9 % 100 mL IVPB        2 g 200 mL/hr over 30 Minutes Intravenous Every 8 hours 09/01/20 3976       Consults: Treatment Team:  Jadene Pierini, MD   Subjective:    Overnight Issues:   Objective:  Vital signs for last 24 hours: Temp:  [97.7 F (36.5 C)-98.8 F (37.1 C)] 97.7 F (36.5 C) (10/14 0800) Pulse Rate:  [55-69] 58 (10/14 0930) Resp:  [17-22] 22 (10/14 0930) BP: (91-112)/(50-65) 107/59 (10/14 0930) SpO2:  [84 %-100 %] 98 % (10/14 0930) FiO2 (%):  [50 %-100 %] 70 % (10/14 0802) Weight:  [90.8 kg] 90.8 kg (10/14 0500)  Hemodynamic parameters for last 24 hours:    Intake/Output from previous day: 10/13 0701 - 10/14 0700 In: 4494.3 [I.V.:2634.3; NG/GT:1560; IV Piggyback:300] Out: 1300 [Urine:1300]  Intake/Output this shift: Total I/O In: 2028.7 [I.V.:97.7; NG/GT:1931] Out: 0   Vent settings for last 24 hours: Vent Mode: PRVC FiO2 (%):  [50 %-100 %] 70  % Set Rate:  [20 bmp] 20 bmp Vt Set:  [510 mL] 510 mL PEEP:  [10 cmH20-12 cmH20] 12 cmH20 Plateau Pressure:  [21 cmH20-28 cmH20] 28 cmH20  Physical Exam:  General: on vnet Neuro: arouses, no change quad HEENT/Neck: ETT Resp: clear to auscultation bilaterally CVS: RRR GI: soft, NT Extremities: calves soft  Results for orders placed or performed during the hospital encounter of 08/27/2020 (from the past 24 hour(s))  Magnesium     Status: Abnormal   Collection Time: 09/02/20  7:04 PM  Result Value Ref Range   Magnesium 2.6 (H) 1.7 - 2.4 mg/dL  Phosphorus     Status: None   Collection Time: 09/02/20  7:04 PM  Result Value Ref Range   Phosphorus 2.7 2.5 - 4.6 mg/dL  Glucose, capillary     Status: Abnormal   Collection Time: 09/02/20 10:03 PM  Result Value Ref Range   Glucose-Capillary 136 (H) 70 - 99 mg/dL   Comment 1 Document in Chart   Glucose, capillary     Status: Abnormal   Collection Time: 09/02/20 11:37 PM  Result Value Ref Range   Glucose-Capillary 120 (H) 70 - 99 mg/dL  Glucose, capillary  Status: Abnormal   Collection Time: 09/03/20  3:41 AM  Result Value Ref Range   Glucose-Capillary 151 (H) 70 - 99 mg/dL  Glucose, capillary     Status: Abnormal   Collection Time: 09/03/20  8:25 AM  Result Value Ref Range   Glucose-Capillary 156 (H) 70 - 99 mg/dL    Assessment & Plan: Present on Admission: . Quadriparesis (HCC)    LOS: 4 days   Additional comments:I reviewed the patient's new clinical lab test results. [name]  C5 FX with quadriparesis - S/P C4-5 ACDF and C3-T2 PSIF 10/10 by Dr. Maurice Small. Diamox added for CSF leak.  Acute hypoxic ventilator dependent respiratory failure - worsening hypoxia, on PEEP 12 anf 70% Trace R PTX - no PTX on F/U CXR Pre-existing medical problems - restarted home valproate, neurontin, synthroid, seroquel, effexor ID - AF in the last 24h, resp cx sent 10/12 with rare GPCs, on empiric Maxipime Neurogenic shock - off levo,  resolved FEN - start TF, phos is better after replacement VTE - PAS, hold starting LMWH until PLTs over 100k Dispo - ICU Daughter reportedly driving down from [place]. Critical Care Total Time*: 39 Minutes  Violeta Gelinas, MD, MPH, FACS Trauma & General Surgery Use [email or web address] to contact on call provider  09/03/2020  *Care during the described time interval was provided by me. I have reviewed this patient's available data, including medical history, events of note, physical examination and test results as part of my evaluation.

## 2020-09-03 NOTE — Progress Notes (Signed)
Neurosurgery Service Progress Note  Subjective: Desats overnight, improved with some recruitment, had some clear drainage from his posterior cervical wound last night  Objective: Vitals:   09/03/20 0730 09/03/20 0800 09/03/20 0802 09/03/20 0830  BP: (!) 107/56 (!) 109/58  (!) 110/58  Pulse: 60 (!) 58 60 62  Resp: 19 19 17 19   Temp:      TempSrc:      SpO2: 98% 98% 99% 96%  Weight:      Height:       Temp (24hrs), Avg:98.3 F (36.8 C), Min:98.1 F (36.7 C), Max:98.8 F (37.1 C)  CBC Latest Ref Rng & Units 09/02/2020 09/02/2020 09/01/2020  WBC 4.0 - 10.5 K/uL - 8.2 -  Hemoglobin 13.0 - 17.0 g/dL 8.5(L) 8.4(L) 9.2(L)  Hematocrit 39 - 52 % 25.0(L) 26.5(L) 27.0(L)  Platelets 150 - 400 K/uL - 60(L) -   BMP Latest Ref Rng & Units 09/02/2020 09/02/2020 09/01/2020  Glucose 70 - 99 mg/dL - 133(H) -  BUN 6 - 20 mg/dL - 20 -  Creatinine 0.61 - 1.24 mg/dL - 0.86 -  Sodium 135 - 145 mmol/L 139 138 138  Potassium 3.5 - 5.1 mmol/L 4.4 4.5 4.3  Chloride 98 - 111 mmol/L - 106 -  CO2 22 - 32 mmol/L - 27 -  Calcium 8.9 - 10.3 mg/dL - 7.2(L) -    Intake/Output Summary (Last 24 hours) at 09/03/2020 0847 Last data filed at 09/03/2020 0800 Gross per 24 hour  Intake 6522.99 ml  Output 1300 ml  Net 5222.99 ml    Current Facility-Administered Medications:  .  0.9 % NaCl with KCl 20 mEq/ L  infusion, , Intravenous, Continuous, Georganna Skeans, MD, Last Rate: 100 mL/hr at 09/03/20 0800, Rate Verify at 09/03/20 0800 .  acetaminophen (TYLENOL) tablet 650 mg, 650 mg, Oral, Q4H PRN, Georganna Skeans, MD .  ceFEPIme (MAXIPIME) 2 g in sodium chloride 0.9 % 100 mL IVPB, 2 g, Intravenous, Q8H, Georganna Skeans, MD, Stopped at 09/03/20 0138 .  chlorhexidine gluconate (MEDLINE KIT) (PERIDEX) 0.12 % solution 15 mL, 15 mL, Mouth Rinse, BID, Romana Juniper A, MD, 15 mL at 09/03/20 0738 .  Chlorhexidine Gluconate Cloth 2 % PADS 6 each, 6 each, Topical, Daily, Judith Part, MD, 6 each at 09/02/20  1600 .  feeding supplement (PIVOT 1.5 CAL) liquid 1,000 mL, 1,000 mL, Per Tube, Continuous, Georganna Skeans, MD, Last Rate: 65 mL/hr at 09/03/20 0700, Rate Verify at 09/03/20 0700 .  feeding supplement (PROSource TF) liquid 45 mL, 45 mL, Per Tube, Daily, Georganna Skeans, MD, 45 mL at 09/02/20 0949 .  fentaNYL (SUBLIMAZE) bolus via infusion 50 mcg, 50 mcg, Intravenous, Q15 min PRN, Kae Heller, Chelsea A, MD .  fentaNYL (SUBLIMAZE) injection 50 mcg, 50 mcg, Intravenous, Once, Kae Heller, Chelsea A, MD .  fentaNYL 2571mg in NS 2535m(1078mml) infusion-PREMIX, 50-200 mcg/hr, Intravenous, Continuous, ConClovis RileyD, Stopped at 09/02/20 [phone] gabapentin (NEURONTIN) 250 MG/5ML solution 600 mg, 600 mg, Per Tube, Q8H, ThoGeorganna SkeansD, 600 mg at 09/02/20 2149 .  guaiFENesin (ROBITUSSIN) 100 MG/5ML solution 300 mg, 15 mL, Oral, Q6H, ThoGeorganna SkeansD, 300 mg at 09/03/20 0540 .  ipratropium-albuterol (DUONEB) 0.5-2.5 (3) MG/3ML nebulizer solution 3 mL, 3 mL, Nebulization, Q6H PRN, ThoGeorganna SkeansD, 3 mL at 08/31/20 1707 .  levothyroxine (SYNTHROID) tablet 75 mcg, 75 mcg, Per Tube, Q0600, ThoGeorganna SkeansD, 75 mcg at 09/03/20 0540 .  MEDLINE mouth rinse, 15 mL, Mouth Rinse, 10 times per day,  Clovis Riley, MD, 15 mL at 09/03/20 0540 .  methocarbamol (ROBAXIN) 1,000 mg in dextrose 5 % 100 mL IVPB, 1,000 mg, Intravenous, Q8H PRN, Georganna Skeans, MD .  morphine 4 MG/ML injection 4 mg, 4 mg, Intravenous, Q2H PRN, Georganna Skeans, MD, 4 mg at 08/31/20 2007 .  ondansetron (ZOFRAN-ODT) disintegrating tablet 4 mg, 4 mg, Oral, Q6H PRN **OR** ondansetron (ZOFRAN) injection 4 mg, 4 mg, Intravenous, Q6H PRN, Georganna Skeans, MD .  oxyCODONE (Oxy IR/ROXICODONE) immediate release tablet 5-10 mg, 5-10 mg, Per Tube, Q4H PRN, Jesusita Oka, MD, 10 mg at 09/02/20 1851 .  pantoprazole sodium (PROTONIX) 40 mg/20 mL oral suspension 40 mg, 40 mg, Per Tube, Daily, Georganna Skeans, MD, 40 mg at 09/02/20 0950 .   propofol (DIPRIVAN) 1000 MG/100ML infusion, 0-50 mcg/kg/min, Intravenous, Continuous, Clovis Riley, MD, Paused at 09/03/20 0126 .  QUEtiapine (SEROQUEL) tablet 600 mg, 600 mg, Per Tube, Barrington Ellison, MD, 600 mg at 09/02/20 2149 .  sodium chloride flush (NS) 0.9 % injection 10-40 mL, 10-40 mL, Intracatheter, Q12H, Georganna Skeans, MD, 10 mL at 09/02/20 2150 .  sodium chloride flush (NS) 0.9 % injection 10-40 mL, 10-40 mL, Intracatheter, PRN, Georganna Skeans, MD .  traMADol Veatrice Bourbon) tablet 50 mg, 50 mg, Oral, Q6H, Georganna Skeans, MD, 50 mg at 09/02/20 2305 .  valproic acid (DEPAKENE) 250 MG/5ML solution 500 mg, 500 mg, Per Tube, QID, Georganna Skeans, MD, 500 mg at 09/02/20 2150 .  venlafaxine [facility]) tablet 75 mg, 75 mg, Per Tube, BID, Georganna Skeans, MD, 75 mg at 09/02/20 2149   Physical Exam: Intubated, eyes open spontaneously, PERRL, gaze neutral, 0/5 x4 with no sensation Posterior incision c/d/i no drainage but some clear staining on the dressing Anterior incision c/d/i, neck soft  Assessment & Plan: 54 y.o. man s/p [facility] w/ severe hyperextension injury s/p C4-5 ACDF and C3-T2 posterior instrumented fusion, post-op still C5 ASIA A. Post-op with some CSF leak from posterior incision  -keep head of bed elevated as much as tolerated -will start diamox to help decrease CSF production  Judith Part  09/03/20 8:47 AM

## 2020-09-04 ENCOUNTER — Inpatient Hospital Stay (HOSPITAL_COMMUNITY): Payer: Medicare Other

## 2020-09-04 LAB — BASIC METABOLIC PANEL
Anion gap: 5 (ref 5–15)
BUN: 34 mg/dL — ABNORMAL HIGH (ref 6–20)
CO2: 23 mmol/L (ref 22–32)
Calcium: 7.9 mg/dL — ABNORMAL LOW (ref 8.9–10.3)
Chloride: 113 mmol/L — ABNORMAL HIGH (ref 98–111)
Creatinine, Ser: 0.7 mg/dL (ref 0.61–1.24)
GFR, Estimated: >60 mL/min (ref >60)
Glucose, Bld: 142 mg/dL — ABNORMAL HIGH (ref 70–99)
Potassium: 3.7 mmol/L (ref 3.5–5.1)
Sodium: 141 mmol/L (ref 135–145)

## 2020-09-04 LAB — CULTURE, RESPIRATORY W GRAM STAIN: Culture: NORMAL

## 2020-09-04 LAB — GLUCOSE, CAPILLARY
Glucose-Capillary: 113 mg/dL — ABNORMAL HIGH (ref 70–99)
Glucose-Capillary: 123 mg/dL — ABNORMAL HIGH (ref 70–99)
Glucose-Capillary: 136 mg/dL — ABNORMAL HIGH (ref 70–99)
Glucose-Capillary: 153 mg/dL — ABNORMAL HIGH (ref 70–99)
Glucose-Capillary: 156 mg/dL — ABNORMAL HIGH (ref 70–99)
Glucose-Capillary: 161 mg/dL — ABNORMAL HIGH (ref 70–99)

## 2020-09-04 LAB — POCT I-STAT 7, (LYTES, BLD GAS, ICA,H+H)
Acid-base deficit: 2 mmol/L (ref 0.0–2.0)
Acid-base deficit: 3 mmol/L — ABNORMAL HIGH (ref 0.0–2.0)
Bicarbonate: 23 mmol/L (ref 20.0–28.0)
Bicarbonate: 22.5 mmol/L (ref 20.0–28.0)
Calcium, Ion: 1.2 mmol/L (ref 1.15–1.40)
Calcium, Ion: 1.2 mmol/L (ref 1.15–1.40)
HCT: 35 % — ABNORMAL LOW (ref 39.0–52.0)
HCT: 23 % — ABNORMAL LOW (ref 39.0–52.0)
Hemoglobin: 11.9 g/dL — ABNORMAL LOW (ref 13.0–17.0)
Hemoglobin: 7.8 g/dL — ABNORMAL LOW (ref 13.0–17.0)
O2 Saturation: 95 %
O2 Saturation: 97 %
Patient temperature: 97.7
Potassium: 3.8 mmol/L (ref 3.5–5.1)
Potassium: 3.6 mmol/L (ref 3.5–5.1)
Sodium: 144 mmol/L (ref 135–145)
Sodium: 145 mmol/L (ref 135–145)
TCO2: 24 mmol/L (ref 22–32)
TCO2: 24 mmol/L (ref 22–32)
pCO2 arterial: 39 mmHg (ref 32.0–48.0)
pCO2 arterial: 38.9 mmHg (ref 32.0–48.0)
pH, Arterial: 7.376 (ref 7.350–7.450)
pH, Arterial: 7.369 (ref 7.350–7.450)
pO2, Arterial: 78 mmHg — ABNORMAL LOW (ref 83.0–108.0)
pO2, Arterial: 92 mmHg (ref 83.0–108.0)

## 2020-09-04 LAB — CBC
HCT: 25.3 % — ABNORMAL LOW (ref 39.0–52.0)
Hemoglobin: 8 g/dL — ABNORMAL LOW (ref 13.0–17.0)
MCH: 31.1 pg (ref 26.0–34.0)
MCHC: 31.6 g/dL (ref 30.0–36.0)
MCV: 98.4 fL (ref 80.0–100.0)
Platelets: 112 10*3/uL — ABNORMAL LOW (ref 150–400)
RBC: 2.57 MIL/uL — ABNORMAL LOW (ref 4.22–5.81)
RDW: 14.6 % (ref 11.5–15.5)
WBC: 7.1 10*3/uL (ref 4.0–10.5)
nRBC: 0 % (ref 0.0–0.2)

## 2020-09-04 MED ORDER — ENOXAPARIN SODIUM 30 MG/0.3ML ~~LOC~~ SOLN
30.0000 mg | Freq: Two times a day (BID) | SUBCUTANEOUS | Status: DC
  Administered 2020-09-04 – 2020-09-06 (×5): 30 mg via SUBCUTANEOUS
  Filled 2020-09-04 (×5): qty 0.3

## 2020-09-04 MED ORDER — POTASSIUM PHOSPHATES 15 MMOLE/5ML IV SOLN
45.0000 mmol | Freq: Once | INTRAVENOUS | Status: AC
  Administered 2020-09-04: 45 mmol via INTRAVENOUS
  Filled 2020-09-04: qty 15

## 2020-09-04 MED ORDER — FUROSEMIDE 10 MG/ML IJ SOLN
20.0000 mg | Freq: Once | INTRAMUSCULAR | Status: AC
  Administered 2020-09-04: 20 mg via INTRAVENOUS
  Filled 2020-09-04: qty 2

## 2020-09-04 NOTE — Progress Notes (Signed)
OT Cancellation Note  Patient Details Name: Aaron [name] MRN: [id number] DOB: 12/02/1965   Cancelled Treatment:    Reason Eval/Treat Not Completed: Medical issues which prohibited therapy.  RN reports pt desaturated into the 70's and request therapies hold today.  Will reattempt next week.  Eber Jones., OTR/L Acute Rehabilitation Services Pager [phone] Office [phone]   Jeani Hawking M 09/04/2020, 8:39 AM

## 2020-09-04 NOTE — Progress Notes (Signed)
Trauma/Critical Care Follow Up Note  Subjective:    Overnight Issues:   Objective:  Vital signs for last 24 hours: Temp:  [97.7 F (36.5 C)-98.4 F (36.9 C)] 98 F (36.7 C) (10/15 0800) Pulse Rate:  [52-72] 55 (10/15 1124) Resp:  [8-24] 12 (10/15 1124) BP: (106-128)/(59-89) 110/61 (10/15 1100) SpO2:  [91 %-99 %] 98 % (10/15 1124) FiO2 (%):  [60 %-80 %] 80 % (10/15 1124)  Hemodynamic parameters for last 24 hours:    Intake/Output from previous day: 10/14 0701 - 10/15 0700 In: 5825.8 [I.V.:2159.8; NG/GT:3366; IV Piggyback:300] Out: 2100 [Urine:2100]  Intake/Output this shift: Total I/O In: 128.4 [I.V.:28.5; IV Piggyback:99.9] Out: -   Vent settings for last 24 hours: Vent Mode: PRVC FiO2 (%):  [60 %-80 %] 80 % Set Rate:  [20 bmp] 20 bmp Vt Set:  [510 mL] 510 mL PEEP:  [12 cmH20] 12 cmH20 Plateau Pressure:  [22 cmH20-29 cmH20] 25 cmH20  Physical Exam:  Gen: comfortable, no distress Neuro: grossly non-focal, does not follow commands HEENT: intubated Neck: c-collar in place CV: RRR Pulm: unlabored breathing, mechanically ventilated Abd: soft, nontender GU: clear, yellow urine Extr: wwp, no edema   Results for orders placed or performed during the hospital encounter of Sep 10, 2020 (from the past 24 hour(s))  Glucose, capillary     Status: Abnormal   Collection Time: 09/03/20 11:55 AM  Result Value Ref Range   Glucose-Capillary 135 (H) 70 - 99 mg/dL  Glucose, capillary     Status: Abnormal   Collection Time: 09/03/20  3:56 PM  Result Value Ref Range   Glucose-Capillary 159 (H) 70 - 99 mg/dL  Glucose, capillary     Status: Abnormal   Collection Time: 09/03/20  7:30 PM  Result Value Ref Range   Glucose-Capillary 131 (H) 70 - 99 mg/dL  Glucose, capillary     Status: Abnormal   Collection Time: 09/03/20 11:14 PM  Result Value Ref Range   Glucose-Capillary 111 (H) 70 - 99 mg/dL  CBC     Status: Abnormal   Collection Time: 09/04/20  2:51 AM  Result Value Ref  Range   WBC 7.1 4.0 - 10.5 K/uL   RBC 2.57 (L) 4.22 - 5.81 MIL/uL   Hemoglobin 8.0 (L) 13.0 - 17.0 g/dL   HCT 70.6 (L) 39 - 52 %   MCV 98.4 80.0 - 100.0 fL   MCH 31.1 26.0 - 34.0 pg   MCHC 31.6 30.0 - 36.0 g/dL   RDW 23.7 62.8 - 31.5 %   Platelets 112 (L) 150 - 400 K/uL   nRBC 0.0 0.0 - 0.2 %  Basic metabolic panel     Status: Abnormal   Collection Time: 09/04/20  2:51 AM  Result Value Ref Range   Sodium 141 135 - 145 mmol/L   Potassium 3.7 3.5 - 5.1 mmol/L   Chloride 113 (H) 98 - 111 mmol/L   CO2 23 22 - 32 mmol/L   Glucose, Bld 142 (H) 70 - 99 mg/dL   BUN 34 (H) 6 - 20 mg/dL   Creatinine, Ser 1.76 0.61 - 1.24 mg/dL   Calcium 7.9 (L) 8.9 - 10.3 mg/dL   GFR, Estimated >16 >07 mL/min   Anion gap 5 5 - 15  Glucose, capillary     Status: Abnormal   Collection Time: 09/04/20  3:23 AM  Result Value Ref Range   Glucose-Capillary 153 (H) 70 - 99 mg/dL  Glucose, capillary     Status: Abnormal   Collection  Time: 09/04/20  7:38 AM  Result Value Ref Range   Glucose-Capillary 156 (H) 70 - 99 mg/dL  Glucose, capillary     Status: Abnormal   Collection Time: 09/04/20 11:30 AM  Result Value Ref Range   Glucose-Capillary 113 (H) 70 - 99 mg/dL    Assessment & Plan: The plan of care was discussed with the bedside nurse for the day, who is in agreement with this plan and no additional concerns were raised.   Present on Admission:  Quadriparesis (HCC)    LOS: 5 days   Additional comments:I reviewed the patient's new clinical lab test results.   and I reviewed the patients new imaging test results.    [facility]  C5 FX with quadriparesis- S/P C4-5 ACDF and C3-T2 PSIF 10/10 by Dr. Maurice Small. Diamox added for CSF leak. Plan for bedside repair of CSF leak today.  Acute hypoxic ventilator dependent respiratory failure- worsening hypoxia, on PEEP 12 and 70%. Desat to 63s with with turn this AM.  Trace R PTX- no PTX on F/UCXR Pre-existing medical problems - restarted home valproate,  neurontin, synthroid, seroquel, effexor. Holding VPA/sero/effexor per family request. ID- AF in the last 24h, resp cx with nl resp flora, d/c cefepime Neurogenic shock- resolved FEN- restart TF, phos replacement VTE- PAS, LMWH today Dispo- ICU  Critical Care Total Time: 55 minutes  Diamantina Monks, MD Trauma & General Surgery Please use [email or web address] to contact on call provider  09/04/2020  *Care during the described time interval was provided by me. I have reviewed this patient's available data, including medical history, events of note, physical examination and test results as part of my evaluation.

## 2020-09-04 NOTE — Progress Notes (Signed)
Neurosurgery Service Progress Note  Subjective: Nursing reported drainage on pillow overnight  Objective: Vitals:   09/04/20 0600 09/04/20 0700 09/04/20 0731 09/04/20 0800  BP: 122/66 107/61 128/76 124/66  Pulse: 60 (!) 54 63 63  Resp: (!) 24 (!) 22 (!) 8 15  Temp:    98 F (36.7 C)  TempSrc:    Axillary  SpO2: 95% 94% 95% 93%  Weight:      Height:       Temp (24hrs), Avg:98 F (36.7 C), Min:97.7 F (36.5 C), Max:98.4 F (36.9 C)  CBC Latest Ref Rng & Units 09/04/2020 09/03/2020 09/02/2020  WBC 4.0 - 10.5 K/uL 7.1 6.4 -  Hemoglobin 13.0 - 17.0 g/dL 8.0(L) 8.1(L) 8.5(L)  Hematocrit 39 - 52 % 25.3(L) 25.6(L) 25.0(L)  Platelets 150 - 400 K/uL 112(L) 68(L) -   BMP Latest Ref Rng & Units 09/04/2020 09/03/2020 09/02/2020  Glucose 70 - 99 mg/dL 142(H) 160(H) -  BUN 6 - 20 mg/dL 34(H) 29(H) -  Creatinine 0.61 - 1.24 mg/dL 0.70 0.64 -  Sodium 135 - 145 mmol/L 141 140 139  Potassium 3.5 - 5.1 mmol/L 3.7 5.1 4.4  Chloride 98 - 111 mmol/L 113(H) 108 -  CO2 22 - 32 mmol/L 23 24 -  Calcium 8.9 - 10.3 mg/dL 7.9(L) 7.6(L) -    Intake/Output Summary (Last 24 hours) at 09/04/2020 0902 Last data filed at 09/04/2020 0800 Gross per 24 hour  Intake 3809.26 ml  Output 2100 ml  Net 1709.26 ml    Current Facility-Administered Medications:  .  0.9 %  sodium chloride infusion, , Intravenous, Continuous, Georganna Skeans, MD, Last Rate: 100 mL/hr at 09/04/20 0156, New Bag at 09/04/20 0156 .  acetaminophen (TYLENOL) tablet 650 mg, 650 mg, Oral, Q4H PRN, Georganna Skeans, MD .  acetaZOLAMIDE (DIAMOX) tablet 500 mg, 500 mg, Per Tube, BID, Blenda Nicely, RPH, 500 mg at 09/03/20 2141 .  ceFEPIme (MAXIPIME) 2 g in sodium chloride 0.9 % 100 mL IVPB, 2 g, Intravenous, Q8H, Georganna Skeans, MD, Stopped at 09/04/20 0200 .  chlorhexidine gluconate (MEDLINE KIT) (PERIDEX) 0.12 % solution 15 mL, 15 mL, Mouth Rinse, BID, Romana Juniper A, MD, 15 mL at 09/04/20 0738 .  Chlorhexidine Gluconate Cloth 2 %  PADS 6 each, 6 each, Topical, Daily, Judith Part, MD, 6 each at 09/03/20 2142 .  feeding supplement (PIVOT 1.5 CAL) liquid 1,000 mL, 1,000 mL, Per Tube, Continuous, Georganna Skeans, MD, Last Rate: 65 mL/hr at 09/03/20 1544, 1,000 mL at 09/03/20 1544 .  feeding supplement (PROSource TF) liquid 45 mL, 45 mL, Per Tube, Daily, Georganna Skeans, MD, 45 mL at 09/03/20 1018 .  fentaNYL (SUBLIMAZE) bolus via infusion 50 mcg, 50 mcg, Intravenous, Q15 min PRN, Kae Heller, Chelsea A, MD .  fentaNYL 2565mg in NS 2517m(1013mml) infusion-PREMIX, 50-200 mcg/hr, Intravenous, Continuous, ConClovis RileyD, Stopped at 09/02/20 0759 .  gabapentin (NEURONTIN) 250 MG/5ML solution 600 mg, 600 mg, Per Tube, Q8H, ThoGeorganna SkeansD, 600 mg at 09/03/20 2141 .  guaiFENesin (ROBITUSSIN) 100 MG/5ML solution 300 mg, 15 mL, Oral, Q6H, ThoGeorganna SkeansD, 300 mg at 09/04/20 0527 .  ipratropium-albuterol (DUONEB) 0.5-2.5 (3) MG/3ML nebulizer solution 3 mL, 3 mL, Nebulization, Q6H PRN, ThoGeorganna SkeansD, 3 mL at 08/31/20 1707 .  levothyroxine (SYNTHROID) tablet 75 mcg, 75 mcg, Per Tube, Q0600, ThoGeorganna SkeansD, 75 mcg at 09/04/20 0528 .  MEDLINE mouth rinse, 15 mL, Mouth Rinse, 10 times per day, ConClovis RileyD, 15 mL  at 09/04/20 0528 .  methocarbamol (ROBAXIN) 1,000 mg in dextrose 5 % 100 mL IVPB, 1,000 mg, Intravenous, Q8H PRN, Georganna Skeans, MD .  morphine 4 MG/ML injection 4 mg, 4 mg, Intravenous, Q2H PRN, Georganna Skeans, MD, 4 mg at 08/31/20 2007 .  ondansetron (ZOFRAN-ODT) disintegrating tablet 4 mg, 4 mg, Oral, Q6H PRN **OR** ondansetron (ZOFRAN) injection 4 mg, 4 mg, Intravenous, Q6H PRN, Georganna Skeans, MD .  oxyCODONE (Oxy IR/ROXICODONE) immediate release tablet 5-10 mg, 5-10 mg, Per Tube, Q4H PRN, Jesusita Oka, MD, 10 mg at 09/02/20 1851 .  pantoprazole sodium (PROTONIX) 40 mg/20 mL oral suspension 40 mg, 40 mg, Per Tube, Daily, Georganna Skeans, MD, 40 mg at 09/03/20 1017 .  propofol  (DIPRIVAN) 1000 MG/100ML infusion, 0-50 mcg/kg/min, Intravenous, Continuous, Romana Juniper A, MD, Last Rate: 4.63 mL/hr at 09/04/20 0800, 10 mcg/kg/min at 09/04/20 0800 .  QUEtiapine (SEROQUEL) tablet 600 mg, 600 mg, Per Tube, Barrington Ellison, MD, 600 mg at 09/03/20 2141 .  sodium chloride flush (NS) 0.9 % injection 10-40 mL, 10-40 mL, Intracatheter, Q12H, Georganna Skeans, MD, 10 mL at 09/03/20 2142 .  sodium chloride flush (NS) 0.9 % injection 10-40 mL, 10-40 mL, Intracatheter, PRN, Georganna Skeans, MD .  traMADol Veatrice Bourbon) tablet 50 mg, 50 mg, Oral, Q6H, Georganna Skeans, MD, 50 mg at 09/04/20 2395 .  valproic acid (DEPAKENE) 250 MG/5ML solution 500 mg, 500 mg, Per Tube, QID, Georganna Skeans, MD, 500 mg at 09/03/20 2141 .  venlafaxine [facility]) tablet 75 mg, 75 mg, Per Tube, BID, Georganna Skeans, MD, 75 mg at 09/03/20 2141   Physical Exam: Intubated, eyes open spontaneously, PERRL, gaze neutral, 0/5 x4 with no sensation Posterior incision c/d/i no drainage with palpation, but some staining on the pillow Anterior incision c/d/i, neck soft  Assessment & Plan: 54 y.o. man s/p [facility] w/ severe hyperextension injury s/p C4-5 ACDF and C3-T2 posterior instrumented fusion, post-op still C5 ASIA A. Post-op with some CSF leak from posterior incision, 10/14 started diamox  -keep head of bed elevated as much as tolerated -continue diamox -unable to express anything this morning. If he continues to have drainage today, will oversew the wound at bedside this afternoon  Judith Part  09/04/20 9:02 AM

## 2020-09-04 NOTE — Progress Notes (Signed)
PT Cancellation Note  Patient Details Name: Aaron [name] MRN: [id number] DOB: 1966-06-25   Cancelled Treatment:    Reason Eval/Treat Not Completed: Medical issues which prohibited therapy. RN reports pt desaturated into the 70's and request therapies hold today.  Will reattempt when pt is more medically appropriate for PT intervention and as time allows.   Arlyss Gandy 09/04/2020, 9:38 AM

## 2020-09-05 LAB — BASIC METABOLIC PANEL
Anion gap: 10 (ref 5–15)
BUN: 41 mg/dL — ABNORMAL HIGH (ref 6–20)
CO2: 20 mmol/L — ABNORMAL LOW (ref 22–32)
Calcium: 8.1 mg/dL — ABNORMAL LOW (ref 8.9–10.3)
Chloride: 113 mmol/L — ABNORMAL HIGH (ref 98–111)
Creatinine, Ser: 0.81 mg/dL (ref 0.61–1.24)
GFR, Estimated: >60 mL/min (ref >60)
Glucose, Bld: 130 mg/dL — ABNORMAL HIGH (ref 70–99)
Potassium: 4 mmol/L (ref 3.5–5.1)
Sodium: 143 mmol/L (ref 135–145)

## 2020-09-05 LAB — CBC
HCT: 24.9 % — ABNORMAL LOW (ref 39.0–52.0)
Hemoglobin: 7.9 g/dL — ABNORMAL LOW (ref 13.0–17.0)
MCH: 31 pg (ref 26.0–34.0)
MCHC: 31.7 g/dL (ref 30.0–36.0)
MCV: 97.6 fL (ref 80.0–100.0)
Platelets: 138 10*3/uL — ABNORMAL LOW (ref 150–400)
RBC: 2.55 MIL/uL — ABNORMAL LOW (ref 4.22–5.81)
RDW: 15.1 % (ref 11.5–15.5)
WBC: 8.1 10*3/uL (ref 4.0–10.5)
nRBC: 0.9 % — ABNORMAL HIGH (ref 0.0–0.2)

## 2020-09-05 LAB — GLUCOSE, CAPILLARY
Glucose-Capillary: 121 mg/dL — ABNORMAL HIGH (ref 70–99)
Glucose-Capillary: 149 mg/dL — ABNORMAL HIGH (ref 70–99)
Glucose-Capillary: 139 mg/dL — ABNORMAL HIGH (ref 70–99)
Glucose-Capillary: 153 mg/dL — ABNORMAL HIGH (ref 70–99)
Glucose-Capillary: 151 mg/dL — ABNORMAL HIGH (ref 70–99)
Glucose-Capillary: 146 mg/dL — ABNORMAL HIGH (ref 70–99)

## 2020-09-05 MED ORDER — DOCUSATE SODIUM 50 MG/5ML PO LIQD
100.0000 mg | Freq: Two times a day (BID) | ORAL | Status: DC
  Administered 2020-09-05 – 2020-09-11 (×10): 100 mg via ORAL
  Filled 2020-09-05 (×12): qty 10

## 2020-09-05 MED ORDER — POLYETHYLENE GLYCOL 3350 17 G PO PACK
17.0000 g | PACK | Freq: Every day | ORAL | Status: DC
  Administered 2020-09-05 – 2020-09-11 (×5): 17 g via ORAL
  Filled 2020-09-05 (×6): qty 1

## 2020-09-05 NOTE — Progress Notes (Addendum)
Patient ID: Aaron [name], male   DOB: [birth year], 54 y.o.   MRN: [id number] Follow up - Trauma Critical Care  Patient Details:    Aaron [name] is an 54 y.o. male.  Lines/tubes : Airway 7.5 mm (Active)  Secured at (cm) 25 cm 09/03/20 0802  Measured From Lips 09/03/20 0802  Secured Location Center 09/03/20 0802  Secured By [employer] 09/03/20 0802  Tube Holder Repositioned Yes 09/03/20 0802  Cuff Pressure (cm H2O) 28 cm H2O 09/02/20 0804  Site Condition Dry 09/03/20 0802     NG/OG Tube Orogastric Xray Measured external length of tube (Active)  Site Assessment Clean;Dry;Intact 09/03/20 0800  Ongoing Placement Verification No change in respiratory status;No acute changes, not attributed to clinical condition 09/03/20 0800  Status Infusing tube feed 09/03/20 0800    Microbiology/Sepsis markers: Results for orders placed or performed during the hospital encounter of 09/25/20  Respiratory Panel by RT PCR (Flu A&B, Covid) - Nasopharyngeal Swab     Status: None   Collection Time: Sep 25, 2020  1:42 AM   Specimen: Nasopharyngeal Swab  Result Value Ref Range Status   SARS Coronavirus 2 by RT PCR NEGATIVE NEGATIVE Final    Comment: (NOTE) SARS-CoV-2 target nucleic acids are NOT DETECTED.  The SARS-CoV-2 RNA is generally detectable in upper respiratoy specimens during the acute phase of infection. The lowest concentration of SARS-CoV-2 viral copies this assay can detect is 131 copies/mL. A negative result does not preclude SARS-Cov-2 infection and should not be used as the sole basis for treatment or other patient management decisions. A negative result may occur with  improper specimen collection/handling, submission of specimen other than nasopharyngeal swab, presence of viral mutation(s) within the areas targeted by this assay, and inadequate number of viral copies (<131 copies/mL). A negative result must be combined with clinical observations, patient history, and  epidemiological information. The expected result is Negative.  Fact Sheet for Patients:  https://www.moore.com/  Fact Sheet for Healthcare Providers:  https://www.young.biz/  This test is no t yet approved or cleared by the [place] FDA and  has been authorized for detection and/or diagnosis of SARS-CoV-2 by FDA under an Emergency Use Authorization (EUA). This EUA will remain  in effect (meaning this test can be used) for the duration of the COVID-19 declaration under Section 564(b)(1) of the Act, 21 U.S.C. section 360bbb-3(b)(1), unless the authorization is terminated or revoked sooner.     Influenza A by PCR NEGATIVE NEGATIVE Final   Influenza B by PCR NEGATIVE NEGATIVE Final    Comment: (NOTE) The Xpert Xpress SARS-CoV-2/FLU/RSV assay is intended as an aid in  the diagnosis of influenza from Nasopharyngeal swab specimens and  should not be used as a sole basis for treatment. Nasal washings and  aspirates are unacceptable for Xpert Xpress SARS-CoV-2/FLU/RSV  testing.  Fact Sheet for Patients: https://www.moore.com/  Fact Sheet for Healthcare Providers: https://www.young.biz/  This test is not yet approved or cleared by the [place] FDA and  has been authorized for detection and/or diagnosis of SARS-CoV-2 by  FDA under an Emergency Use Authorization (EUA). This EUA will remain  in effect (meaning this test can be used) for the duration of the  Covid-19 declaration under Section 564(b)(1) of the Act, 21  U.S.C. section 360bbb-3(b)(1), unless the authorization is  terminated or revoked. Performed at [facility], [address] [place], [place] [address]   MRSA PCR Screening     Status: None   Collection Time: 09/25/2020 11:48 PM  Specimen: Nasal Mucosa; Nasopharyngeal  Result Value Ref Range Status   MRSA by PCR NEGATIVE NEGATIVE Final    Comment:        The GeneXpert MRSA  Assay (FDA approved for NASAL specimens only), is one component of a comprehensive MRSA colonization surveillance program. It is not intended to diagnose MRSA infection nor to guide or monitor treatment for MRSA infections. Performed at [facility], [address] [place], [place] [address]   Culture, respiratory (non-expectorated)     Status: None   Collection Time: 09/01/20  1:50 PM   Specimen: Tracheal Aspirate; Respiratory  Result Value Ref Range Status   Specimen Description TRACHEAL ASPIRATE  Final   Special Requests NONE  Final   Gram Stain   Final    FEW WBC PRESENT,BOTH PMN AND MONONUCLEAR RARE GRAM POSITIVE COCCI    Culture   Final    Normal respiratory flora-no Staph aureus or Pseudomonas seen Performed at [facility], [address] [place], [place] [address]    Report Status 09/04/2020 FINAL  Final    Anti-infectives:  Anti-infectives (From admission, onward)   Start     Dose/Rate Route Frequency Ordered Stop   09/01/20 1000  ceFEPIme (MAXIPIME) 2 g in sodium chloride 0.9 % 100 mL IVPB  Status:  Discontinued        2 g 200 mL/hr over 30 Minutes Intravenous Every 8 hours 09/01/20 0906 09/04/20 1114     Consults: Treatment Team:  Jadene Pierinistergard, Thomas A, MD   Subjective:    Overnight Issues:  FiO2 down to 50% this am Objective:  Vital signs for last 24 hours: Temp:  [97 F (36.1 C)-98.3 F (36.8 C)] 98.3 F (36.8 C) (10/16 0400) Pulse Rate:  [51-63] 58 (10/16 0800) Resp:  [11-23] 17 (10/16 0800) BP: (104-123)/(52-80) 113/63 (10/16 0700) SpO2:  [92 %-99 %] 96 % (10/16 0821) FiO2 (%):  [50 %-80 %] 50 % (10/16 0821)  Hemodynamic parameters for last 24 hours:    Intake/Output from previous day: 10/15 0701 - 10/16 0700 In: 4383.7 [I.V.:2512.5; NG/GT:1235; IV Piggyback:636.2] Out: 1975 [Urine:1975]  Intake/Output this shift: No intake/output data recorded.  Vent settings for last 24 hours: Vent Mode: PRVC FiO2 (%):  [50 %-80 %] 50  % Set Rate:  [20 bmp] 20 bmp Vt Set:  [510 mL-550 mL] 550 mL PEEP:  [12 cmH20] 12 cmH20 Plateau Pressure:  [21 cmH20-25 cmH20] 21 cmH20  Physical Exam:  General: on vent, sedated Neuro: arouses, no change quad HEENT/Neck: ETT Resp: clear to auscultation bilaterally CVS: RRR GI: soft, NT Extremities: calves soft no edema  Results for orders placed or performed during the hospital encounter of 03/09/2020 (from the past 24 hour(s))  Glucose, capillary     Status: Abnormal   Collection Time: 09/04/20 11:30 AM  Result Value Ref Range   Glucose-Capillary 113 (H) 70 - 99 mg/dL  I-STAT 7, (LYTES, BLD GAS, ICA, H+H)     Status: Abnormal   Collection Time: 09/04/20 11:42 AM  Result Value Ref Range   pH, Arterial 7.369 7.35 - 7.45   pCO2 arterial 38.9 32 - 48 mmHg   pO2, Arterial 92 83 - 108 mmHg   Bicarbonate 22.5 20.0 - 28.0 mmol/L   TCO2 24 22 - 32 mmol/L   O2 Saturation 97.0 %   Acid-base deficit 3.0 (H) 0.0 - 2.0 mmol/L   Sodium 145 135 - 145 mmol/L   Potassium 3.6 3.5 - 5.1 mmol/L  Calcium, Ion 1.20 1.15 - 1.40 mmol/L   HCT 23.0 (L) 39 - 52 %   Hemoglobin 7.8 (L) 13.0 - 17.0 g/dL   Sample type ARTERIAL   Glucose, capillary     Status: Abnormal   Collection Time: 09/04/20  3:20 PM  Result Value Ref Range   Glucose-Capillary 161 (H) 70 - 99 mg/dL  Glucose, capillary     Status: Abnormal   Collection Time: 09/04/20  7:45 PM  Result Value Ref Range   Glucose-Capillary 136 (H) 70 - 99 mg/dL  Glucose, capillary     Status: Abnormal   Collection Time: 09/04/20 11:32 PM  Result Value Ref Range   Glucose-Capillary 123 (H) 70 - 99 mg/dL  CBC     Status: Abnormal   Collection Time: 09/05/20  3:06 AM  Result Value Ref Range   WBC 8.1 4.0 - 10.5 K/uL   RBC 2.55 (L) 4.22 - 5.81 MIL/uL   Hemoglobin 7.9 (L) 13.0 - 17.0 g/dL   HCT 16.1 (L) 39 - 52 %   MCV 97.6 80.0 - 100.0 fL   MCH 31.0 26.0 - 34.0 pg   MCHC 31.7 30.0 - 36.0 g/dL   RDW 09.6 04.5 - 40.9 %   Platelets 138 (L) 150 -  400 K/uL   nRBC 0.9 (H) 0.0 - 0.2 %  Basic metabolic panel     Status: Abnormal   Collection Time: 09/05/20  3:06 AM  Result Value Ref Range   Sodium 143 135 - 145 mmol/L   Potassium 4.0 3.5 - 5.1 mmol/L   Chloride 113 (H) 98 - 111 mmol/L   CO2 20 (L) 22 - 32 mmol/L   Glucose, Bld 130 (H) 70 - 99 mg/dL   BUN 41 (H) 6 - 20 mg/dL   Creatinine, Ser 8.11 0.61 - 1.24 mg/dL   Calcium 8.1 (L) 8.9 - 10.3 mg/dL   GFR, Estimated >91 >47 mL/min   Anion gap 10 5 - 15  Glucose, capillary     Status: Abnormal   Collection Time: 09/05/20  3:32 AM  Result Value Ref Range   Glucose-Capillary 121 (H) 70 - 99 mg/dL  Glucose, capillary     Status: Abnormal   Collection Time: 09/05/20  7:40 AM  Result Value Ref Range   Glucose-Capillary 149 (H) 70 - 99 mg/dL   Comment 1 Notify RN    Comment 2 Document in Chart     Assessment & Plan: Present on Admission: . Quadriparesis (HCC)    LOS: 6 days   Additional comments:I reviewed the patient's new clinical lab test results. [name] [facility]  C5 FX with quadriparesis - S/P C4-5 ACDF and C3-T2 PSIF 10/10 by Dr. Maurice Small. Diamox added for CSF leak.  Acute hypoxic ventilator dependent respiratory failure - starting to have some improving hypoxia, FiO2 down to 50% but  PEEP 12 Trace R PTX - no PTX on F/U CXR Pre-existing medical problems - restarted home valproate, neurontin, synthroid, seroquel, effexor ID - AF in the last 48hr, resp cx neg 10/12 with rare GPCs,  empiric Maxipime stopped 10/15 Neurogenic shock - off levo, resolved FEN - TF, no BM since 11/10 - will add bowel regimen ABL anemia - hgb 11.9-->7.8-->7.9; monitor, repeat CBC in am VTE - PAS,  [facility] Dispo - ICU  Critical Care Total Time*: 30 Minutes  Jearldine Cassady M. Andrey Campanile, MD, FACS General, Bariatric, & Minimally Invasive Surgery [facility], [place]   09/05/2020  *Care during the described time interval was  provided by me. I have reviewed this patient's available data, including medical  history, events of note, physical examination and test results as part of my evaluation.
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
## 2020-09-06 LAB — POCT I-STAT 7, (LYTES, BLD GAS, ICA,H+H)
Acid-base deficit: 3 mmol/L — ABNORMAL HIGH (ref 0.0–2.0)
Bicarbonate: 22.2 mmol/L (ref 20.0–28.0)
Calcium, Ion: 1.24 mmol/L (ref 1.15–1.40)
HCT: 27 % — ABNORMAL LOW (ref 39.0–52.0)
Hemoglobin: 9.2 g/dL — ABNORMAL LOW (ref 13.0–17.0)
O2 Saturation: 99 %
Patient temperature: 98.2
Potassium: 3.9 mmol/L (ref 3.5–5.1)
Sodium: 148 mmol/L — ABNORMAL HIGH (ref 135–145)
TCO2: 23 mmol/L (ref 22–32)
pCO2 arterial: 41.2 mmHg (ref 32.0–48.0)
pH, Arterial: 7.339 — ABNORMAL LOW (ref 7.350–7.450)
pO2, Arterial: 155 mmHg — ABNORMAL HIGH (ref 83.0–108.0)

## 2020-09-06 LAB — CBC
HCT: 25.8 % — ABNORMAL LOW (ref 39.0–52.0)
Hemoglobin: 8.1 g/dL — ABNORMAL LOW (ref 13.0–17.0)
MCH: 31 pg (ref 26.0–34.0)
MCHC: 31.4 g/dL (ref 30.0–36.0)
MCV: 98.9 fL (ref 80.0–100.0)
Platelets: 173 10*3/uL (ref 150–400)
RBC: 2.61 MIL/uL — ABNORMAL LOW (ref 4.22–5.81)
RDW: 15.9 % — ABNORMAL HIGH (ref 11.5–15.5)
WBC: 8.8 10*3/uL (ref 4.0–10.5)
nRBC: 3.5 % — ABNORMAL HIGH (ref 0.0–0.2)

## 2020-09-06 LAB — BASIC METABOLIC PANEL
Anion gap: 5 (ref 5–15)
BUN: 47 mg/dL — ABNORMAL HIGH (ref 6–20)
CO2: 23 mmol/L (ref 22–32)
Calcium: 8.3 mg/dL — ABNORMAL LOW (ref 8.9–10.3)
Chloride: 116 mmol/L — ABNORMAL HIGH (ref 98–111)
Creatinine, Ser: 0.72 mg/dL (ref 0.61–1.24)
GFR, Estimated: >60 mL/min (ref >60)
Glucose, Bld: 160 mg/dL — ABNORMAL HIGH (ref 70–99)
Potassium: 3.7 mmol/L (ref 3.5–5.1)
Sodium: 144 mmol/L (ref 135–145)

## 2020-09-06 LAB — GLUCOSE, CAPILLARY
Glucose-Capillary: 118 mg/dL — ABNORMAL HIGH (ref 70–99)
Glucose-Capillary: 118 mg/dL — ABNORMAL HIGH (ref 70–99)
Glucose-Capillary: 125 mg/dL — ABNORMAL HIGH (ref 70–99)
Glucose-Capillary: 126 mg/dL — ABNORMAL HIGH (ref 70–99)
Glucose-Capillary: 130 mg/dL — ABNORMAL HIGH (ref 70–99)
Glucose-Capillary: 134 mg/dL — ABNORMAL HIGH (ref 70–99)

## 2020-09-06 LAB — TRIGLYCERIDES: Triglycerides: 50 mg/dL (ref <150)

## 2020-09-06 NOTE — Progress Notes (Signed)
Patient ID: Aaron [name], male   DOB: 1966-06-06, 54 y.o.   MRN: [id number] Follow up - Trauma Critical Care  Patient Details:    Aaron [name] is an 54 y.o. male.  Lines/tubes : Airway 7.5 mm (Active)  Secured at (cm) 25 cm 09/03/20 0802  Measured From Lips 09/03/20 0802  Secured Location Center 09/03/20 0802  Secured By [employer] 09/03/20 0802  Tube Holder Repositioned Yes 09/03/20 0802  Cuff Pressure (cm H2O) 28 cm H2O 09/02/20 0804  Site Condition Dry 09/03/20 0802     NG/OG Tube Orogastric Xray Measured external length of tube (Active)  Site Assessment Clean;Dry;Intact 09/03/20 0800  Ongoing Placement Verification No change in respiratory status;No acute changes, not attributed to clinical condition 09/03/20 0800  Status Infusing tube feed 09/03/20 0800    Microbiology/Sepsis markers: Results for orders placed or performed during the hospital encounter of 09/12/2020  Respiratory Panel by RT PCR (Flu A&B, Covid) - Nasopharyngeal Swab     Status: None   Collection Time: 09/13/2020  1:42 AM   Specimen: Nasopharyngeal Swab  Result Value Ref Range Status   SARS Coronavirus 2 by RT PCR NEGATIVE NEGATIVE Final    Comment: (NOTE) SARS-CoV-2 target nucleic acids are NOT DETECTED.  The SARS-CoV-2 RNA is generally detectable in upper respiratoy specimens during the acute phase of infection. The lowest concentration of SARS-CoV-2 viral copies this assay can detect is 131 copies/mL. A negative result does not preclude SARS-Cov-2 infection and should not be used as the sole basis for treatment or other patient management decisions. A negative result may occur with  improper specimen collection/handling, submission of specimen other than nasopharyngeal swab, presence of viral mutation(s) within the areas targeted by this assay, and inadequate number of viral copies (<131 copies/mL). A negative result must be combined with clinical observations, patient history, and  epidemiological information. The expected result is Negative.  Fact Sheet for Patients:  https://www.moore.com/  Fact Sheet for Healthcare Providers:  https://www.young.biz/  This test is no t yet approved or cleared by the [place] FDA and  has been authorized for detection and/or diagnosis of SARS-CoV-2 by FDA under an Emergency Use Authorization (EUA). This EUA will remain  in effect (meaning this test can be used) for the duration of the COVID-19 declaration under Section 564(b)(1) of the Act, 21 U.S.C. section 360bbb-3(b)(1), unless the authorization is terminated or revoked sooner.     Influenza A by PCR NEGATIVE NEGATIVE Final   Influenza B by PCR NEGATIVE NEGATIVE Final    Comment: (NOTE) The Xpert Xpress SARS-CoV-2/FLU/RSV assay is intended as an aid in  the diagnosis of influenza from Nasopharyngeal swab specimens and  should not be used as a sole basis for treatment. Nasal washings and  aspirates are unacceptable for Xpert Xpress SARS-CoV-2/FLU/RSV  testing.  Fact Sheet for Patients: https://www.moore.com/  Fact Sheet for Healthcare Providers: https://www.young.biz/  This test is not yet approved or cleared by the [place] FDA and  has been authorized for detection and/or diagnosis of SARS-CoV-2 by  FDA under an Emergency Use Authorization (EUA). This EUA will remain  in effect (meaning this test can be used) for the duration of the  Covid-19 declaration under Section 564(b)(1) of the Act, 21  U.S.C. section 360bbb-3(b)(1), unless the authorization is  terminated or revoked. Performed at [facility], [address] [place], [place] [address]   MRSA PCR Screening     Status: None   Collection Time: 08/31/2020 11:48 PM  Specimen: Nasal Mucosa; Nasopharyngeal  Result Value Ref Range Status   MRSA by PCR NEGATIVE NEGATIVE Final    Comment:        The GeneXpert MRSA  Assay (FDA approved for NASAL specimens only), is one component of a comprehensive MRSA colonization surveillance program. It is not intended to diagnose MRSA infection nor to guide or monitor treatment for MRSA infections. Performed at [facility], [address] [place], [place] [address]   Culture, respiratory (non-expectorated)     Status: None   Collection Time: 09/01/20  1:50 PM   Specimen: Tracheal Aspirate; Respiratory  Result Value Ref Range Status   Specimen Description TRACHEAL ASPIRATE  Final   Special Requests NONE  Final   Gram Stain   Final    FEW WBC PRESENT,BOTH PMN AND MONONUCLEAR RARE GRAM POSITIVE COCCI    Culture   Final    Normal respiratory flora-no Staph aureus or Pseudomonas seen Performed at [facility], [address] [place], [place] [address]    Report Status 09/04/2020 FINAL  Final    Anti-infectives:  Anti-infectives (From admission, onward)   Start     Dose/Rate Route Frequency Ordered Stop   09/01/20 1000  ceFEPIme (MAXIPIME) 2 g in sodium chloride 0.9 % 100 mL IVPB  Status:  Discontinued        2 g 200 mL/hr over 30 Minutes Intravenous Every 8 hours 09/01/20 0906 09/04/20 1114     Consults: Treatment Team:  Jadene Pierinistergard, Thomas A, MD   Subjective:    Overnight Issues:  FiO2 down to 50% this am Objective:  Vital signs for last 24 hours: Temp:  [97.2 F (36.2 C)-98.6 F (37 C)] 98.3 F (36.8 C) (10/17 0800) Pulse Rate:  [48-62] 51 (10/17 0800) Resp:  [15-25] 16 (10/17 0800) BP: (105-128)/(56-69) 109/59 (10/17 0800) SpO2:  [91 %-98 %] 96 % (10/17 0800) FiO2 (%):  [50 %] 50 % (10/17 0800)  Hemodynamic parameters for last 24 hours:    Intake/Output from previous day: 10/16 0701 - 10/17 0700 In: 4047.9 [I.V.:2487.9; NG/GT:1560] Out: 1750 [Urine:1750]  Intake/Output this shift: No intake/output data recorded.  Vent settings for last 24 hours: Vent Mode: PRVC FiO2 (%):  [50 %] 50 % Set Rate:  [20 bmp] 20  bmp Vt Set:  [550 mL] 550 mL PEEP:  [12 cmH20] 12 cmH20 Plateau Pressure:  [20 cmH20-24 cmH20] 20 cmH20  Physical Exam:  General: on vent, sedated Neuro: arouses, no change quad HEENT/Neck: ETT Resp: clear to auscultation bilaterally CVS: RRR GI: soft, NT Extremities: calves soft no edema  Results for orders placed or performed during the hospital encounter of 08-07-2020 (from the past 24 hour(s))  Glucose, capillary     Status: Abnormal   Collection Time: 09/05/20 11:27 AM  Result Value Ref Range   Glucose-Capillary 139 (H) 70 - 99 mg/dL   Comment 1 Notify RN    Comment 2 Document in Chart   Glucose, capillary     Status: Abnormal   Collection Time: 09/05/20  3:47 PM  Result Value Ref Range   Glucose-Capillary 153 (H) 70 - 99 mg/dL  Glucose, capillary     Status: Abnormal   Collection Time: 09/05/20  7:21 PM  Result Value Ref Range   Glucose-Capillary 151 (H) 70 - 99 mg/dL  Glucose, capillary     Status: Abnormal   Collection Time: 09/05/20 11:27 PM  Result Value Ref Range   Glucose-Capillary 146 (H) 70 - 99  mg/dL  Glucose, capillary     Status: Abnormal   Collection Time: 09/06/20  3:23 AM  Result Value Ref Range   Glucose-Capillary 134 (H) 70 - 99 mg/dL  I-STAT 7, (LYTES, BLD GAS, ICA, H+H)     Status: Abnormal   Collection Time: 09/06/20  5:17 AM  Result Value Ref Range   pH, Arterial 7.339 (L) 7.35 - 7.45   pCO2 arterial 41.2 32 - 48 mmHg   pO2, Arterial 155 (H) 83 - 108 mmHg   Bicarbonate 22.2 20.0 - 28.0 mmol/L   TCO2 23 22 - 32 mmol/L   O2 Saturation 99.0 %   Acid-base deficit 3.0 (H) 0.0 - 2.0 mmol/L   Sodium 148 (H) 135 - 145 mmol/L   Potassium 3.9 3.5 - 5.1 mmol/L   Calcium, Ion 1.24 1.15 - 1.40 mmol/L   HCT 27.0 (L) 39 - 52 %   Hemoglobin 9.2 (L) 13.0 - 17.0 g/dL   Patient temperature 36.6 F    Collection site Radial    Drawn by RT    Sample type ARTERIAL   Basic metabolic panel     Status: Abnormal   Collection Time: 09/06/20  6:00 AM  Result Value  Ref Range   Sodium 144 135 - 145 mmol/L   Potassium 3.7 3.5 - 5.1 mmol/L   Chloride 116 (H) 98 - 111 mmol/L   CO2 23 22 - 32 mmol/L   Glucose, Bld 160 (H) 70 - 99 mg/dL   BUN 47 (H) 6 - 20 mg/dL   Creatinine, Ser 2.94 0.61 - 1.24 mg/dL   Calcium 8.3 (L) 8.9 - 10.3 mg/dL   GFR, Estimated >76 >54 mL/min   Anion gap 5 5 - 15  CBC     Status: Abnormal   Collection Time: 09/06/20  6:00 AM  Result Value Ref Range   WBC 8.8 4.0 - 10.5 K/uL   RBC 2.61 (L) 4.22 - 5.81 MIL/uL   Hemoglobin 8.1 (L) 13.0 - 17.0 g/dL   HCT 65.0 (L) 39 - 52 %   MCV 98.9 80.0 - 100.0 fL   MCH 31.0 26.0 - 34.0 pg   MCHC 31.4 30.0 - 36.0 g/dL   RDW 35.4 (H) 65.6 - 81.2 %   Platelets 173 150 - 400 K/uL   nRBC 3.5 (H) 0.0 - 0.2 %  Triglycerides     Status: None   Collection Time: 09/06/20  6:01 AM  Result Value Ref Range   Triglycerides 50 <150 mg/dL  Glucose, capillary     Status: Abnormal   Collection Time: 09/06/20  7:50 AM  Result Value Ref Range   Glucose-Capillary 125 (H) 70 - 99 mg/dL    Assessment & Plan: Present on Admission: . Quadriparesis (HCC)    LOS: 7 days   Additional comments:I reviewed the patient's new clinical lab test results. [name] [facility]  C5 FX with quadriparesis - S/P C4-5 ACDF and C3-T2 PSIF 10/10 by Dr. Maurice Small. Diamox added for CSF leak.  Acute hypoxic ventilator dependent respiratory failure - starting to have some improving hypoxia, FiO2 down to 50% but  PEEP 12 Trace R PTX - no PTX on F/U CXR Pre-existing medical problems - restarted home valproate, neurontin, synthroid, seroquel, effexor ID - AF in the last 48hr, resp cx neg 10/12 with rare GPCs,  empiric Maxipime stopped 10/15 Neurogenic shock - off levo, resolved FEN - TF, no BM since 11/10 - will add bowel regimen ABL anemia - hgb 11.9-->7.8-->7.9-> 8.1 VTE -  PAS,  [facility] Dispo - ICU  Critical Care Total Time*: 30 Minutes  Berna Bue MD, FACS General, Bariatric, & Minimally In[facility]  Surgery, [place]   09/06/2020  *Care during the described time interval was provided by me. I have reviewed this patient's available data, including medical history, events of note, physical examination and test results as part of my evaluation.

## 2020-09-06 NOTE — Progress Notes (Signed)
   Providing Compassionate, Quality Care - Together  NEUROSURGERY PROGRESS NOTE   S: No issues overnight.   O: EXAM:  BP 113/60   Pulse (!) 56   Temp 98.3 F (36.8 C) (Axillary)   Resp (!) 23   Ht 5\' 6"  (1.676 m)   Wt 90.8 kg   SpO2 95%   BMI 32.31 kg/m   Intubated Eyes open to voice PERRL Neck soft, no signs of leaking posteriorly Motor 0/5 x4 Shaking head to basic questions  ASSESSMENT:  54 y.o. male with  s/p [facility] w/ severe hyperextension injury s/p C4-5 ACDF and C3-T2 posterior instrumented fusion, post-op still C5 ASIA A. Post-op with some CSF leak from posterior incision, 10/14 started diamox  PLAN: - elevate HOB - diamox - supportive care - wean vent as tolerated, possible trach needed   Thank you for allowing me to participate in this patient's care.  Please do not hesitate to call with questions or concerns.   [facility], DO Neurosurgeon [facility] Cell: [phone]

## 2020-09-07 ENCOUNTER — Inpatient Hospital Stay (HOSPITAL_COMMUNITY): Payer: Medicare Other

## 2020-09-07 LAB — BASIC METABOLIC PANEL
Anion gap: 9 (ref 5–15)
BUN: 50 mg/dL — ABNORMAL HIGH (ref 6–20)
CO2: 19 mmol/L — ABNORMAL LOW (ref 22–32)
Calcium: 8.3 mg/dL — ABNORMAL LOW (ref 8.9–10.3)
Chloride: 118 mmol/L — ABNORMAL HIGH (ref 98–111)
Creatinine, Ser: 0.73 mg/dL (ref 0.61–1.24)
GFR, Estimated: >60 mL/min (ref >60)
Glucose, Bld: 129 mg/dL — ABNORMAL HIGH (ref 70–99)
Potassium: 4.3 mmol/L (ref 3.5–5.1)
Sodium: 146 mmol/L — ABNORMAL HIGH (ref 135–145)

## 2020-09-07 LAB — GLUCOSE, CAPILLARY
Glucose-Capillary: 114 mg/dL — ABNORMAL HIGH (ref 70–99)
Glucose-Capillary: 116 mg/dL — ABNORMAL HIGH (ref 70–99)
Glucose-Capillary: 117 mg/dL — ABNORMAL HIGH (ref 70–99)
Glucose-Capillary: 119 mg/dL — ABNORMAL HIGH (ref 70–99)
Glucose-Capillary: 130 mg/dL — ABNORMAL HIGH (ref 70–99)
Glucose-Capillary: 136 mg/dL — ABNORMAL HIGH (ref 70–99)

## 2020-09-07 LAB — CBC
HCT: 25.4 % — ABNORMAL LOW (ref 39.0–52.0)
Hemoglobin: 7.8 g/dL — ABNORMAL LOW (ref 13.0–17.0)
MCH: 30.8 pg (ref 26.0–34.0)
MCHC: 30.7 g/dL (ref 30.0–36.0)
MCV: 100.4 fL — ABNORMAL HIGH (ref 80.0–100.0)
Platelets: 191 10*3/uL (ref 150–400)
RBC: 2.53 MIL/uL — ABNORMAL LOW (ref 4.22–5.81)
RDW: 16.2 % — ABNORMAL HIGH (ref 11.5–15.5)
WBC: 9.2 10*3/uL (ref 4.0–10.5)
nRBC: 7 % — ABNORMAL HIGH (ref 0.0–0.2)

## 2020-09-07 LAB — PATHOLOGIST SMEAR REVIEW

## 2020-09-07 LAB — MAGNESIUM: Magnesium: 2.6 mg/dL — ABNORMAL HIGH (ref 1.7–2.4)

## 2020-09-07 MED ORDER — FUROSEMIDE 10 MG/ML IJ SOLN
20.0000 mg | Freq: Once | INTRAMUSCULAR | Status: AC
  Administered 2020-09-07: 20 mg via INTRAVENOUS
  Filled 2020-09-07: qty 2

## 2020-09-07 MED ORDER — METHOCARBAMOL 500 MG PO TABS
1000.0000 mg | ORAL_TABLET | Freq: Three times a day (TID) | ORAL | Status: DC
  Administered 2020-09-07 – 2020-09-09 (×6): 1000 mg via ORAL
  Filled 2020-09-07 (×7): qty 2

## 2020-09-07 MED ORDER — ENOXAPARIN SODIUM 40 MG/0.4ML ~~LOC~~ SOLN
40.0000 mg | Freq: Two times a day (BID) | SUBCUTANEOUS | Status: DC
  Administered 2020-09-07 – 2020-09-21 (×29): 40 mg via SUBCUTANEOUS
  Filled 2020-09-07 (×29): qty 0.4

## 2020-09-07 MED ORDER — BISACODYL 10 MG RE SUPP
10.0000 mg | Freq: Every day | RECTAL | Status: DC
  Administered 2020-09-07 – 2020-09-21 (×6): 10 mg via RECTAL
  Filled 2020-09-07 (×7): qty 1

## 2020-09-07 MED ORDER — ACETAMINOPHEN 500 MG PO TABS
1000.0000 mg | ORAL_TABLET | Freq: Four times a day (QID) | ORAL | Status: DC
  Administered 2020-09-07 – 2020-09-09 (×6): 1000 mg via ORAL
  Filled 2020-09-07 (×8): qty 2

## 2020-09-07 NOTE — Progress Notes (Signed)
Neurosurgery Service Progress Note  Subjective: No acute events overnight. No drainage from posterior incision- stopped Saturday 10/16  Objective: Vitals:   09/07/20 1000 09/07/20 1136 09/07/20 1200 09/07/20 1300  BP: (!) 106/52  110/74 (!) 117/53  Pulse: (!) 52 (!) 55 (!) 55 (!) 50  Resp: 19 20 18 19  Temp:   98.2 F (36.8 C)   TempSrc:   Axillary   SpO2: 93% 97% 97% 98%  Weight:      Height:       Temp (24hrs), Avg:98.5 F (36.9 C), Min:98.1 F (36.7 C), Max:99.1 F (37.3 C)  CBC Latest Ref Rng & Units 09/07/2020 09/06/2020 09/06/2020  WBC 4.0 - 10.5 K/uL 9.2 8.8 -  Hemoglobin 13.0 - 17.0 g/dL 7.8(L) 8.1(L) 9.2(L)  Hematocrit 39 - 52 % 25.4(L) 25.8(L) 27.0(L)  Platelets 150 - 400 K/uL 191 173 -   BMP Latest Ref Rng & Units 09/07/2020 09/06/2020 09/06/2020  Glucose 70 - 99 mg/dL 129(H) 160(H) -  BUN 6 - 20 mg/dL 50(H) 47(H) -  Creatinine 0.61 - 1.24 mg/dL 0.73 0.72 -  Sodium 135 - 145 mmol/L 146(H) 144 148(H)  Potassium 3.5 - 5.1 mmol/L 4.3 3.7 3.9  Chloride 98 - 111 mmol/L 118(H) 116(H) -  CO2 22 - 32 mmol/L 19(L) 23 -  Calcium 8.9 - 10.3 mg/dL 8.3(L) 8.3(L) -    Intake/Output Summary (Last 24 hours) at 09/07/2020 1357 Last data filed at 09/07/2020 1300 Gross per 24 hour  Intake 4818.88 ml  Output 2500 ml  Net 2318.88 ml    Current Facility-Administered Medications:  .  acetaminophen (TYLENOL) tablet 1,000 mg, 1,000 mg, Oral, Q6H, Lovick, Ayesha N, MD, 1,000 mg at 09/07/20 0917 .  acetaZOLAMIDE (DIAMOX) tablet 500 mg, 500 mg, Per Tube, BID, Patton, Karen K, RPH, 500 mg at 09/07/20 0922 .  bisacodyl (DULCOLAX) suppository 10 mg, 10 mg, Rectal, Daily, Lovick, Ayesha N, MD, 10 mg at 09/07/20 1027 .  chlorhexidine gluconate (MEDLINE KIT) (PERIDEX) 0.12 % solution 15 mL, 15 mL, Mouth Rinse, BID, Connor, Chelsea A, MD, 15 mL at 09/07/20 0730 .  Chlorhexidine Gluconate Cloth 2 % PADS 6 each, 6 each, Topical, Daily, Ostergard, Thomas A, MD, 6 each at 09/06/20 2253 .   docusate (COLACE) 50 MG/5ML liquid 100 mg, 100 mg, Oral, BID, Wilson, Eric, MD, 100 mg at 09/07/20 0914 .  enoxaparin (LOVENOX) injection 40 mg, 40 mg, Subcutaneous, Q12H, Lovick, Ayesha N, MD, 40 mg at 09/07/20 0915 .  feeding supplement (PIVOT 1.5 CAL) liquid 1,000 mL, 1,000 mL, Per Tube, Continuous, Thompson, Burke, MD, Last Rate: 65 mL/hr at 09/07/20 0937, 1,000 mL at 09/07/20 0937 .  feeding supplement (PROSource TF) liquid 45 mL, 45 mL, Per Tube, Daily, Thompson, Burke, MD, 45 mL at 09/07/20 0914 .  fentaNYL (SUBLIMAZE) bolus via infusion 50 mcg, 50 mcg, Intravenous, Q15 min PRN, Connor, Chelsea A, MD .  fentaNYL 2500mcg in NS 250mL (10mcg/ml) infusion-PREMIX, 50-200 mcg/hr, Intravenous, Continuous, Connor, Chelsea A, MD, Last Rate: 10 mL/hr at 09/07/20 0722, 100 mcg/hr at 09/07/20 0722 .  gabapentin (NEURONTIN) 250 MG/5ML solution 600 mg, 600 mg, Per Tube, Q8H, Thompson, Burke, MD, 600 mg at 09/07/20 1324 .  guaiFENesin (ROBITUSSIN) 100 MG/5ML solution 300 mg, 15 mL, Oral, Q6H, Thompson, Burke, MD, 300 mg at 09/07/20 1237 .  ipratropium-albuterol (DUONEB) 0.5-2.5 (3) MG/3ML nebulizer solution 3 mL, 3 mL, Nebulization, Q6H PRN, Thompson, Burke, MD, 3 mL at 08/31/20 1707 .  levothyroxine (SYNTHROID) tablet 75 mcg, 75   mcg, Per Tube, Q0600, Thompson, Burke, MD, 75 mcg at 09/07/20 0615 .  MEDLINE mouth rinse, 15 mL, Mouth Rinse, 10 times per day, Connor, Chelsea A, MD, 15 mL at 09/07/20 1233 .  methocarbamol (ROBAXIN) tablet 1,000 mg, 1,000 mg, Oral, Q8H, Lovick, Ayesha N, MD, 1,000 mg at 09/07/20 0914 .  morphine 4 MG/ML injection 4 mg, 4 mg, Intravenous, Q2H PRN, Thompson, Burke, MD, 4 mg at 08/31/20 2007 .  ondansetron (ZOFRAN-ODT) disintegrating tablet 4 mg, 4 mg, Oral, Q6H PRN **OR** ondansetron (ZOFRAN) injection 4 mg, 4 mg, Intravenous, Q6H PRN, Thompson, Burke, MD .  oxyCODONE (Oxy IR/ROXICODONE) immediate release tablet 5-10 mg, 5-10 mg, Per Tube, Q4H PRN, Lovick, Ayesha N, MD, 10 mg at  09/02/20 1851 .  pantoprazole sodium (PROTONIX) 40 mg/20 mL oral suspension 40 mg, 40 mg, Per Tube, Daily, Thompson, Burke, MD, 40 mg at 09/07/20 0914 .  polyethylene glycol (MIRALAX / GLYCOLAX) packet 17 g, 17 g, Oral, Daily, Wilson, Eric, MD, 17 g at 09/07/20 0914 .  propofol (DIPRIVAN) 1000 MG/100ML infusion, 0-50 mcg/kg/min, Intravenous, Continuous, Connor, Chelsea A, MD, Last Rate: 9.25 mL/hr at 09/07/20 1300, 20 mcg/kg/min at 09/07/20 1300 .  sodium chloride flush (NS) 0.9 % injection 10-40 mL, 10-40 mL, Intracatheter, Q12H, Thompson, Burke, MD, 10 mL at 09/07/20 0917 .  sodium chloride flush (NS) 0.9 % injection 10-40 mL, 10-40 mL, Intracatheter, PRN, Thompson, Burke, MD .  traMADol (ULTRAM) tablet 50 mg, 50 mg, Oral, Q6H, Thompson, Burke, MD, 50 mg at 09/07/20 0615   Physical Exam: Intubated, eyes open spontaneously,  motor 0/5 x4  Anterior Incision: c/d/i Posterior incision c/d/i no drainage, c/d/i  Assessment & Plan: 54 y.o. man s/p [facility] w/ severe hyperextension injury s/p C4-5 ACDF and C3-T2 posterior instrumented fusion, post-op still C5 ASIA A. Post-op with some CSF leak from posterior incision, 10/14 started diamox  -Keep HOB elevated  -Continue diamox today, will stop diamox tomorrow  -Supportive care    , NP  09/07/20 1:57 PM 

## 2020-09-07 NOTE — Progress Notes (Addendum)
Will take foley catheter out and I&O Q6 if needed per Dr. Marvetta Gibbons recommendation.

## 2020-09-07 NOTE — Progress Notes (Signed)
PT Cancellation Note  Patient Details Name: Argyle Gustafson MRN: [id number] DOB: 12-May-1966   Cancelled Treatment:    Reason Eval/Treat Not Completed: Medical issues which prohibited therapy Holding PT, per RN as pt is still intubated and sedated. Will follow.  Blake Divine A Lorenzo Arscott 09/07/2020, 7:44 AM [place], PT, DPT Acute Rehabilitation Services Pager [phone] Office [phone]

## 2020-09-07 NOTE — Progress Notes (Signed)
Trauma/Critical Care Follow Up Note  Subjective:    Overnight Issues:   Objective:  Vital signs for last 24 hours: Temp:  [98.1 F (36.7 C)-99.1 F (37.3 C)] 98.7 F (37.1 C) (10/18 0400) Pulse Rate:  [51-60] 51 (10/18 0800) Resp:  [13-24] 20 (10/18 0800) BP: (105-124)/(54-64) 111/58 (10/18 0800) SpO2:  [93 %-99 %] 96 % (10/18 0800) FiO2 (%):  [40 %-50 %] 40 % (10/18 0736) Weight:  [99 kg] 99 kg (10/18 0500)  Hemodynamic parameters for last 24 hours:    Intake/Output from previous day: 10/17 0701 - 10/18 0700 In: 3788.6 [I.V.:2203.6; NG/GT:1585] Out: 2150 [Urine:2150]  Intake/Output this shift: Total I/O In: 348.3 [I.V.:348.3] Out: -   Vent settings for last 24 hours: Vent Mode: PRVC FiO2 (%):  [40 %-50 %] 40 % Set Rate:  [20 bmp] 20 bmp Vt Set:  [550 mL] 550 mL PEEP:  [12 cmH20] 12 cmH20 Plateau Pressure:  [14 cmH20-22 cmH20] 18 cmH20  Physical Exam:  Gen: comfortable, no distress Neuro: grossly non-focal, does not follow commands HEENT: intubated CV: RRR Pulm: unlabored breathing, mechanically ventilated Abd: soft, nontender GU: clear, yellow urine Extr: wwp, no edema  Results for orders placed or performed during the hospital encounter of 09-10-2020 (from the past 24 hour(s))  Glucose, capillary     Status: Abnormal   Collection Time: 09/06/20 11:37 AM  Result Value Ref Range   Glucose-Capillary 130 (H) 70 - 99 mg/dL  Glucose, capillary     Status: Abnormal   Collection Time: 09/06/20  2:58 PM  Result Value Ref Range   Glucose-Capillary 126 (H) 70 - 99 mg/dL  Glucose, capillary     Status: Abnormal   Collection Time: 09/06/20  7:47 PM  Result Value Ref Range   Glucose-Capillary 118 (H) 70 - 99 mg/dL  Glucose, capillary     Status: Abnormal   Collection Time: 09/06/20 11:33 PM  Result Value Ref Range   Glucose-Capillary 118 (H) 70 - 99 mg/dL  CBC     Status: Abnormal   Collection Time: 09/07/20  1:39 AM  Result Value Ref Range   WBC 9.2 4.0 -  10.5 K/uL   RBC 2.53 (L) 4.22 - 5.81 MIL/uL   Hemoglobin 7.8 (L) 13.0 - 17.0 g/dL   HCT 09.6 (L) 39 - 52 %   MCV 100.4 (H) 80.0 - 100.0 fL   MCH 30.8 26.0 - 34.0 pg   MCHC 30.7 30.0 - 36.0 g/dL   RDW 28.3 (H) 66.2 - 94.7 %   Platelets 191 150 - 400 K/uL   nRBC 7.0 (H) 0.0 - 0.2 %  Basic metabolic panel     Status: Abnormal   Collection Time: 09/07/20  1:39 AM  Result Value Ref Range   Sodium 146 (H) 135 - 145 mmol/L   Potassium 4.3 3.5 - 5.1 mmol/L   Chloride 118 (H) 98 - 111 mmol/L   CO2 19 (L) 22 - 32 mmol/L   Glucose, Bld 129 (H) 70 - 99 mg/dL   BUN 50 (H) 6 - 20 mg/dL   Creatinine, Ser 6.54 0.61 - 1.24 mg/dL   Calcium 8.3 (L) 8.9 - 10.3 mg/dL   GFR, Estimated >65 >03 mL/min   Anion gap 9 5 - 15  Magnesium     Status: Abnormal   Collection Time: 09/07/20  1:39 AM  Result Value Ref Range   Magnesium 2.6 (H) 1.7 - 2.4 mg/dL  Glucose, capillary     Status: Abnormal  Collection Time: 09/07/20  3:38 AM  Result Value Ref Range   Glucose-Capillary 117 (H) 70 - 99 mg/dL  Glucose, capillary     Status: Abnormal   Collection Time: 09/07/20  8:12 AM  Result Value Ref Range   Glucose-Capillary 116 (H) 70 - 99 mg/dL    Assessment & Plan: The plan of care was discussed with the bedside nurse for the day, TK, who is in agreement with this plan and no additional concerns were raised.   Present on Admission: . Quadriparesis (HCC)    LOS: 8 days   Additional comments:I reviewed the patient's new clinical lab test results.   and I reviewed the patients new imaging test results.    [facility]  C5 FX with quadriparesis- S/P C4-5 ACDF and C3-T2 PSIF 10/10 by Dr. Maurice Small. Diamox added for CSF leak.  Acute hypoxic ventilator dependent respiratory failure- wean FiO2 and PEEP to maintain sats >92%. Will need trach/peg, when vent settings lower Trace R PTX- no PTX on F/UCXR Pre-existing medical problems - restarted home valproate, neurontin, synthroid, seroquel, effexor. Holding  VPA/ser/effexor per family request. FEN- TF, no BM since 11/10 - escalate bowel regimen to add dulcolax daily, lasix 20 Foley - recommend removal and Q6 I/O ABL anemia - hgb stable VTE- PAS,  [facility]  Dispo- ICU  Critical Care Total Time: 45 minutes  Diamantina Monks, MD Trauma & General Surgery Please use [email or web address] to contact on call provider  09/07/2020  *Care during the described time interval was provided by me. I have reviewed this patient's available data, including medical history, events of note, physical examination and test results as part of my evaluation.

## 2020-09-08 LAB — GLUCOSE, CAPILLARY
Glucose-Capillary: 105 mg/dL — ABNORMAL HIGH (ref 70–99)
Glucose-Capillary: 134 mg/dL — ABNORMAL HIGH (ref 70–99)
Glucose-Capillary: 134 mg/dL — ABNORMAL HIGH (ref 70–99)
Glucose-Capillary: 137 mg/dL — ABNORMAL HIGH (ref 70–99)
Glucose-Capillary: 144 mg/dL — ABNORMAL HIGH (ref 70–99)
Glucose-Capillary: 145 mg/dL — ABNORMAL HIGH (ref 70–99)

## 2020-09-08 MED ORDER — METOLAZONE 5 MG PO TABS
5.0000 mg | ORAL_TABLET | Freq: Once | ORAL | Status: AC
  Administered 2020-09-08: 5 mg via ORAL
  Filled 2020-09-08: qty 1

## 2020-09-08 NOTE — Progress Notes (Signed)
Patient is maintaining  SP02 at this time.  I am weaning FI02 and peep.  FI02 decreased to 80% and peep to 10.  RT will continue to wean as tolerated.

## 2020-09-08 NOTE — Progress Notes (Addendum)
Occupational Therapy Treatment Patient Details Name: Aaron [name] MRN: [id number] DOB: 28-Apr-1966 Today's Date: 09/08/2020    History of present illness This 54 y.o. male admitted after [facility] - no LOC, but unable to move Bil. UEs and LEs.  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  He underwent C4-5 ACDF, C3,C4,C5, T1,T2 laminectomies, C3-T2 PLIF.  Pt remained intubated post op.  Pt  PMH includes: Bipolar disorder, HTN; s/p ACDF C5-C7   OT comments  Pt with slow progress towards OT Goals. Pt with sedation lifted during session but with continued lethargy and poor arousal levels. Use of bed egress to chair position to promote being upright and to attempt increasing pt's arousal level. Pt does occasionally nod his head in response to questions (moreso "no" vs "yes" at this time so unsure of full accuracy of responses). Pt also noted to demonstrate bil shoulder shrug and activating/performing on command (R>L). Repositioned for comfort while in chair egress including scrotal elevation due to notable edema. SpO2 >/=90% on vent support (FiO2 40%, PEEP 8) and BP stable (see below). Will continue per POC at this time.   BP supine: 102/56 Chair egress: 114/56 approx 5 min chair egress: 116/60 End of session (remained in modified chair egress): 114/59   Follow Up Recommendations  CIR;Supervision/Assistance - 24 hour    Equipment Recommendations  Other (comment);Hospital bed;Wheelchair (measurements OT);Wheelchair cushion (measurements OT) (TBD)          Precautions / Restrictions Precautions Precautions: Other (comment) Precaution Booklet Issued: No Precaution Comments: ETT, swollen scrotum, watch 02 Restrictions Weight Bearing Restrictions: No       Mobility Bed Mobility Overal bed mobility: Needs Assistance             General bed mobility comments: Placed pt in Egress position in bed to help  elicit arousal/participation.  Transfers                 General transfer comment: pt unable         ADL either performed or assessed with clinical judgement   ADL Overall ADL's : Needs assistance/impaired                                       General ADL Comments: remains totalA                       Cognition Arousal/Alertness: Lethargic Behavior During Therapy: Flat affect Overall Cognitive Status: Difficult to assess Area of Impairment: Following commands                       Following Commands: Follows one step commands inconsistently       General Comments: Lethargic with eyes closed for all of session. Perseverating on shaking head no during session. Occasionally nodding appropriately to questions but difficult to distinguish due to arousal/inconsistencies. Follows <25% of commands.No response to noxious stimulus in all extremities.        Exercises Other Exercises Other Exercises: PROM of BLEs of ankle, knee, hip. Other Exercises: Worked on trunk coming forward in bed in chair position Other Exercises: Cervical AROM stretching Other Exercises: Shoulder shrugs x5 (able to activate left side some). Other Exercises: Stretching LE heel cords and into Hip IR as pt favors hip ER  Shoulder Instructions       General Comments Pt on 40% Fi02, PEEP 8; Supine BP 102/56, sitting BP in chair position 114/56. SP02 stayed <89%. HR bradycardic in 40s-50s.    Pertinent Vitals/ Pain       Pain Assessment: Faces Faces Pain Scale: No hurt Pain Intervention(s): Monitored during session  Home Living                                          Prior Functioning/Environment              Frequency  Min 2X/week        Progress Toward Goals  OT Goals(current goals can now be found in the care plan section)  Progress towards OT goals: OT to reassess next treatment  Acute Rehab OT Goals Patient Stated Goal:  pt unable to state  OT Goal Formulation: Patient unable to participate in goal setting Time For Goal Achievement: 09/16/20 Potential to Achieve Goals: Good ADL Goals Additional ADL Goal #1: Pt will tolerate upright posture/sitting x 10 mins with stable vitals in prep for ADLs/functional transfers Additional ADL Goal #2: Pt will consistently maintain arousal during therapy sessions Additional ADL Goal #3: family will be independent with PROM bil. UEs Additional ADL Goal #4: Will use soft touch call bell with Lt UE and mod A  Plan Discharge plan remains appropriate    Co-evaluation    PT/OT/SLP Co-Evaluation/Treatment: Yes Reason for Co-Treatment: Complexity of the patient's impairments (multi-system involvement);Necessary to address cognition/behavior during functional activity;For patient/therapist safety PT goals addressed during session: Mobility/safety with mobility;Strengthening/ROM OT goals addressed during session: Strengthening/ROM      AM-PAC OT "6 Clicks" Daily Activity     Outcome Measure   Help from another person eating meals?: Total Help from another person taking care of personal grooming?: Total Help from another person toileting, which includes using toliet, bedpan, or urinal?: Total Help from another person bathing (including washing, rinsing, drying)?: Total Help from another person to put on and taking off regular upper body clothing?: Total Help from another person to put on and taking off regular lower body clothing?: Total 6 Click Score: 6    End of Session Equipment Utilized During Treatment: Oxygen  OT Visit Diagnosis: Muscle weakness (generalized) (M62.81)   Activity Tolerance Patient limited by lethargy   Patient Left in bed;with call bell/phone within reach;with bed alarm set   Nurse Communication Mobility status        Time: 4332-9518 OT Time Calculation (min): 24 min  Charges: OT General Charges $OT Visit: 1 Visit OT  Treatments $Therapeutic Activity: 8-22 mins  Marcy Siren, OT Acute Rehabilitation Services Pager [phone] Office [phone]    Aaron [name] 09/08/2020, 3:58 PM

## 2020-09-08 NOTE — Progress Notes (Signed)
Patient had a episode of low SP02 was suctioned and it didn't increase sats.  Patient was placed back on 100% and 12 of peep.  RT will assess and wean as tolerated throughout the shift.

## 2020-09-08 NOTE — Progress Notes (Signed)
Physical Therapy Treatment Patient Details Name: Aaron [name] MRN: [id number] DOB: 11/25/1965 Today's Date: 09/08/2020    History of Present Illness This 54 y.o. male admitted after [facility] - no LOC, but unable to move Bil. UEs and LEs.  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  He underwent C4-5 ACDF, C3,C4,C5, T1,T2 laminectomies, C3-T2 PLIF.  Pt remained intubated post op.  Pt  PMH includes: Bipolar disorder, HTN; s/p ACDF C5-C7    PT Comments    Patient is lethargic today despite sedation turned down. Pt tolerated chair position in bed throughout session with stable vitals. Sp02 stayed >89% on 40% Fi02, PEEP 8 with HR bradycardic in 40-50s bpm. Able to follow <25% of commands with increased time and repetition. Able to shrug shoulders to command. Perseverates on shaking head no. Worked on PROM of all extremities, stretching of cervical musculature and LE heel cords/Hip external rotators. Pt noted to have swollen scrotum so adjusted sling and provided some elevation. Will continue to follow to progress. Pt left in chair position in bed.    Follow Up Recommendations  CIR (pending improvement)     Equipment Recommendations  Wheelchair (measurements PT);Wheelchair cushion (measurements PT);Hospital bed    Recommendations for Other Services       Precautions / Restrictions Precautions Precautions: Other (comment) Precaution Booklet Issued: No Precaution Comments: ETT, swollen scrotum, watch 02 Restrictions Weight Bearing Restrictions: No    Mobility  Bed Mobility               General bed mobility comments: Placed pt in Egress position in bed to help elicit arousal/participation.  Transfers                 General transfer comment: pt unable   Ambulation/Gait                 Stairs             Wheelchair Mobility    Modified Rankin (Stroke Patients Only)        Balance                                            Cognition Arousal/Alertness: Lethargic Behavior During Therapy: Flat affect Overall Cognitive Status: Difficult to assess Area of Impairment: Following commands                       Following Commands: Follows one step commands inconsistently       General Comments: Lethargic with eyes closed for all of session. Perseverating on shaking head no during session. Occasionally nodding appropriately to questions but difficult to distinguish due to arousal/inconsistencies. Follows <25% of commands.No response to noxious stimulus in all extremities.      Exercises Other Exercises Other Exercises: PROM of BLEs of ankle, knee, hip. Other Exercises: Worked on trunk coming forward in bed in chair position Other Exercises: Cervical AROM stretching Other Exercises: Shoulder shrugs x5 (able to activate left side some). Other Exercises: Stretching LE heel cords and into Hip IR as pt favors hip ER    General Comments General comments (skin integrity, edema, etc.): Pt on 40% Fi02, PEEP 8; Supine BP 102/56, sitting BP in chair position 114/56. SP02 stayed <89%. HR bradycardic in 40s-50s.  Pertinent Vitals/Pain Pain Assessment: Faces Faces Pain Scale: No hurt    Home Living                      Prior Function            PT Goals (current goals can now be found in the care plan section) Progress towards PT goals: Not progressing toward goals - comment (due to arousal/cognition?)    Frequency    Min 3X/week      PT Plan Current plan remains appropriate    Co-evaluation PT/OT/SLP Co-Evaluation/Treatment: Yes Reason for Co-Treatment: Complexity of the patient's impairments (multi-system involvement);For patient/therapist safety;Necessary to address cognition/behavior during functional activity PT goals addressed during session: Mobility/safety with mobility;Strengthening/ROM         AM-PAC PT "6 Clicks" Mobility   Outcome Measure  Help needed turning from your back to your side while in a flat bed without using bedrails?: Total Help needed moving from lying on your back to sitting on the side of a flat bed without using bedrails?: Total Help needed moving to and from a bed to a chair (including a wheelchair)?: Total Help needed standing up from a chair using your arms (e.g., wheelchair or bedside chair)?: Total Help needed to walk in hospital room?: Total Help needed climbing 3-5 steps with a railing? : Total 6 Click Score: 6    End of Session Equipment Utilized During Treatment: Oxygen (ETT) Activity Tolerance: Patient limited by lethargy Patient left: in bed;with call bell/phone within reach;with bed alarm set;with SCD's reapplied (chair position) Nurse Communication: Mobility status;Need for lift equipment PT Visit Diagnosis: Other abnormalities of gait and mobility (R26.89);Muscle weakness (generalized) (M62.81);Other symptoms and signs involving the nervous system (R29.898)     Time: 6073-7106 PT Time Calculation (min) (ACUTE ONLY): 24 min  Charges:  $Therapeutic Exercise: 8-22 mins                     Aaron [name], PT, DPT Acute Rehabilitation Services Pager [phone] Office [phone]       Blake Divine A Lanier Ensign 09/08/2020, 12:26 PM

## 2020-09-08 NOTE — Progress Notes (Signed)
Trauma/Critical Care Follow Up Note  Subjective:    Overnight Issues:   Objective:  Vital signs for last 24 hours: Temp:  [98.1 F (36.7 C)-99.9 F (37.7 C)] 98.1 F (36.7 C) (10/19 0400) Pulse Rate:  [45-57] 45 (10/19 0800) Resp:  [13-23] 19 (10/19 0800) BP: (101-117)/(51-74) 105/59 (10/19 0800) SpO2:  [92 %-100 %] 99 % (10/19 0800) FiO2 (%):  [50 %-60 %] 50 % (10/19 0758) Weight:  [98 kg] 98 kg (10/19 0411)  Hemodynamic parameters for last 24 hours:    Intake/Output from previous day: 10/18 0701 - 10/19 0700 In: 2175.2 [I.V.:680.2; NG/GT:1495] Out: 1950 [Urine:1950]  Intake/Output this shift: No intake/output data recorded.  Vent settings for last 24 hours: Vent Mode: PRVC FiO2 (%):  [50 %-60 %] 50 % Set Rate:  [20 bmp] 20 bmp Vt Set:  [550 mL] 550 mL PEEP:  [8 cmH20-12 cmH20] 8 cmH20 Plateau Pressure:  [16 cmH20-24 cmH20] 18 cmH20  Physical Exam:  Gen: comfortable, no distress Neuro: opens eyes to sternal rub this AM, shakes head non-purposefully, does not follow commands HEENT: intubated Neck: s/p fixation CV: RRR Pulm: unlabored breathing, mechanically ventilated Abd: soft, nontender GU: clear, yellow urine Extr: wwp, no edema   Results for orders placed or performed during the hospital encounter of 09/09/2020 (from the past 24 hour(s))  Glucose, capillary     Status: Abnormal   Collection Time: 09/07/20 12:01 PM  Result Value Ref Range   Glucose-Capillary 130 (H) 70 - 99 mg/dL  Glucose, capillary     Status: Abnormal   Collection Time: 09/07/20  4:00 PM  Result Value Ref Range   Glucose-Capillary 136 (H) 70 - 99 mg/dL  Glucose, capillary     Status: Abnormal   Collection Time: 09/07/20  7:49 PM  Result Value Ref Range   Glucose-Capillary 114 (H) 70 - 99 mg/dL  Glucose, capillary     Status: Abnormal   Collection Time: 09/07/20 11:45 PM  Result Value Ref Range   Glucose-Capillary 119 (H) 70 - 99 mg/dL  Glucose, capillary     Status: Abnormal    Collection Time: 09/08/20  3:46 AM  Result Value Ref Range   Glucose-Capillary 105 (H) 70 - 99 mg/dL  Glucose, capillary     Status: Abnormal   Collection Time: 09/08/20  8:03 AM  Result Value Ref Range   Glucose-Capillary 134 (H) 70 - 99 mg/dL    Assessment & Plan: The plan of care was discussed with the bedside nurse for the day, who is in agreement with this plan and no additional concerns were raised.   Present on Admission:  Quadriparesis (HCC)    LOS: 9 days   Additional comments:I reviewed the patient's new clinical lab test results.   and I reviewed the patients new imaging test results.    [facility]  C5 FX with quadriparesis- S/P C4-5 ACDF and C3-T2 PSIF 10/10 by Dr. Maurice Small. Diamox added for CSF leak.  Acute hypoxic ventilator dependent respiratory failure- wean FiO2 andPEEP to maintain sats >92%. Will need trach/peg, when vent settings lower Trace R PTX- no PTX on F/UCXR Pre-existing medical problems- restarted home valproate, neurontin, synthroid, seroquel, effexor. Holding VPA/ser/effexor per family request. FEN- TF, metolazone x1 today Foley - d/c and Q6 I/O ABL anemia - hgb stable VTE- PAS, LMWH  Dispo- ICU  Critical Care Total Time: 50 minutes  Diamantina Monks, MD Trauma & General Surgery Please use [email or web address] to contact on call provider  09/08/2020  *Care  during the described time interval was provided by me. I have reviewed this patient's available data, including medical history, events of note, physical examination and test results as part of my evaluation.

## 2020-09-08 NOTE — Progress Notes (Signed)
Patient desats to low 80s.  RT called to bedside.  Patient suctions and vent settings increased.  Patient's spO2 staying in low 90s.  MD paged and made aware.  Per verbal order to change PEEP setting to 12.  RT at bedside and change vent settings.

## 2020-09-09 LAB — GLUCOSE, CAPILLARY
Glucose-Capillary: 104 mg/dL — ABNORMAL HIGH (ref 70–99)
Glucose-Capillary: 127 mg/dL — ABNORMAL HIGH (ref 70–99)
Glucose-Capillary: 151 mg/dL — ABNORMAL HIGH (ref 70–99)
Glucose-Capillary: 154 mg/dL — ABNORMAL HIGH (ref 70–99)
Glucose-Capillary: 158 mg/dL — ABNORMAL HIGH (ref 70–99)
Glucose-Capillary: 169 mg/dL — ABNORMAL HIGH (ref 70–99)

## 2020-09-09 LAB — TRIGLYCERIDES: Triglycerides: 67 mg/dL (ref <150)

## 2020-09-09 MED ORDER — ACETAMINOPHEN 500 MG PO TABS
1000.0000 mg | ORAL_TABLET | Freq: Four times a day (QID) | ORAL | Status: DC
  Administered 2020-09-09 – 2020-09-21 (×46): 1000 mg
  Filled 2020-09-09 (×45): qty 2

## 2020-09-09 MED ORDER — METHOCARBAMOL 500 MG PO TABS
1000.0000 mg | ORAL_TABLET | Freq: Three times a day (TID) | ORAL | Status: DC
  Administered 2020-09-09 – 2020-09-21 (×36): 1000 mg
  Filled 2020-09-09 (×36): qty 2

## 2020-09-09 NOTE — Progress Notes (Signed)
Pts HR sustaining around 42 with BP 98/50 MAP 65 even after sedation and pain gtts decreased.  Pt shaking head back and forth intermittently, not following commands.  Dr. Janee Morn called and notified.  Advised to continue weaning pain and sedation gtts as tolerated.  No new orders for now.

## 2020-09-09 NOTE — Progress Notes (Signed)
Neurosurgery Service Progress Note  Subjective: No staining of posterior dressing overnight per nursing, issues w/ bradycardia / increased vent support in past 24h  Objective: Vitals:   09/09/20 0600 09/09/20 0700 09/09/20 0800 09/09/20 0808  BP: (!) 98/50 (!) 94/54 102/64 102/64  Pulse: (!) 44 (!) 44 (!) 46 (!) 48  Resp: (!) 21 19 (!) 21 (!) 24  Temp:    97.7 F (36.5 C)  TempSrc:    Axillary  SpO2: 98% 100% 100% 100%  Weight:      Height:       Temp (24hrs), Avg:98 F (36.7 C), Min:97.5 F (36.4 C), Max:98.6 F (37 C)  CBC Latest Ref Rng & Units 09/07/2020 09/06/2020 09/06/2020  WBC 4.0 - 10.5 K/uL 9.2 8.8 -  Hemoglobin 13.0 - 17.0 g/dL 7.8(L) 8.1(L) 9.2(L)  Hematocrit 39 - 52 % 25.4(L) 25.8(L) 27.0(L)  Platelets 150 - 400 K/uL 191 173 -   BMP Latest Ref Rng & Units 09/07/2020 09/06/2020 09/06/2020  Glucose 70 - 99 mg/dL 129(H) 160(H) -  BUN 6 - 20 mg/dL 50(H) 47(H) -  Creatinine 0.61 - 1.24 mg/dL 0.73 0.72 -  Sodium 135 - 145 mmol/L 146(H) 144 148(H)  Potassium 3.5 - 5.1 mmol/L 4.3 3.7 3.9  Chloride 98 - 111 mmol/L 118(H) 116(H) -  CO2 22 - 32 mmol/L 19(L) 23 -  Calcium 8.9 - 10.3 mg/dL 8.3(L) 8.3(L) -    Intake/Output Summary (Last 24 hours) at 09/09/2020 0844 Last data filed at 09/09/2020 0800 Gross per 24 hour  Intake 1875.05 ml  Output 1850 ml  Net 25.05 ml    Current Facility-Administered Medications:  .  acetaminophen (TYLENOL) tablet 1,000 mg, 1,000 mg, Oral, Q6H, Jesusita Oka, MD, 1,000 mg at 09/09/20 0336 .  bisacodyl (DULCOLAX) suppository 10 mg, 10 mg, Rectal, Daily, Jesusita Oka, MD, 10 mg at 09/07/20 1027 .  chlorhexidine gluconate (MEDLINE KIT) (PERIDEX) 0.12 % solution 15 mL, 15 mL, Mouth Rinse, BID, Romana Juniper A, MD, 15 mL at 09/08/20 1958 .  Chlorhexidine Gluconate Cloth 2 % PADS 6 each, 6 each, Topical, Daily, Judith Part, MD, 6 each at 09/08/20 2218 .  docusate (COLACE) 50 MG/5ML liquid 100 mg, 100 mg, Oral, BID, Greer Pickerel, MD, 100 mg at 09/09/20 0013 .  enoxaparin (LOVENOX) injection 40 mg, 40 mg, Subcutaneous, Q12H, Jesusita Oka, MD, 40 mg at 09/08/20 2218 .  feeding supplement (PIVOT 1.5 CAL) liquid 1,000 mL, 1,000 mL, Per Tube, Continuous, Georganna Skeans, MD, Last Rate: 65 mL/hr at 09/09/20 0554, 1,000 mL at 09/09/20 0554 .  feeding supplement (PROSource TF) liquid 45 mL, 45 mL, Per Tube, Daily, Georganna Skeans, MD, 45 mL at 09/08/20 0903 .  fentaNYL (SUBLIMAZE) bolus via infusion 50 mcg, 50 mcg, Intravenous, Q15 min PRN, Kae Heller, Chelsea A, MD .  fentaNYL 251mg in NS 2547m(1036mml) infusion-PREMIX, 50-200 mcg/hr, Intravenous, Continuous, ConRomana Juniper MD, Last Rate: 7.5 mL/hr at 09/08/20 0830, 75 mcg/hr at 09/08/20 0830 .  gabapentin (NEURONTIN) 250 MG/5ML solution 600 mg, 600 mg, Per Tube, Q8H, ThoGeorganna SkeansD, 600 mg at 09/09/20 0554 .  guaiFENesin (ROBITUSSIN) 100 MG/5ML solution 300 mg, 15 mL, Oral, Q6H, ThoGeorganna SkeansD, 300 mg at 09/09/20 0554 .  ipratropium-albuterol (DUONEB) 0.5-2.5 (3) MG/3ML nebulizer solution 3 mL, 3 mL, Nebulization, Q6H PRN, ThoGeorganna SkeansD, 3 mL at 08/31/20 1707 .  levothyroxine (SYNTHROID) tablet 75 mcg, 75 mcg, Per Tube, Q0600, ThoGeorganna SkeansD, 75 mcg at 09/09/20 0554 .  MEDLINE mouth rinse, 15 mL, Mouth Rinse, 10 times per day, Romana Juniper A, MD, 15 mL at 09/09/20 0554 .  methocarbamol (ROBAXIN) tablet 1,000 mg, 1,000 mg, Oral, Q8H, Lovick, Montel Culver, MD, 1,000 mg at 09/09/20 0335 .  morphine 4 MG/ML injection 4 mg, 4 mg, Intravenous, Q2H PRN, Georganna Skeans, MD, 4 mg at 08/31/20 2007 .  ondansetron (ZOFRAN-ODT) disintegrating tablet 4 mg, 4 mg, Oral, Q6H PRN **OR** ondansetron (ZOFRAN) injection 4 mg, 4 mg, Intravenous, Q6H PRN, Georganna Skeans, MD .  oxyCODONE (Oxy IR/ROXICODONE) immediate release tablet 5-10 mg, 5-10 mg, Per Tube, Q4H PRN, Jesusita Oka, MD, 10 mg at 09/02/20 1851 .  pantoprazole sodium (PROTONIX) 40 mg/20 mL oral  suspension 40 mg, 40 mg, Per Tube, Daily, Georganna Skeans, MD, 40 mg at 09/08/20 0904 .  polyethylene glycol (MIRALAX / GLYCOLAX) packet 17 g, 17 g, Oral, Daily, Greer Pickerel, MD, 17 g at 09/07/20 0914 .  propofol (DIPRIVAN) 1000 MG/100ML infusion, 0-50 mcg/kg/min, Intravenous, Continuous, Clovis Riley, MD, Paused at 09/09/20 [phone] .  sodium chloride flush (NS) 0.9 % injection 10-40 mL, 10-40 mL, Intracatheter, Q12H, Georganna Skeans, MD, 10 mL at 09/08/20 2218 .  sodium chloride flush (NS) 0.9 % injection 10-40 mL, 10-40 mL, Intracatheter, PRN, Georganna Skeans, MD .  traMADol Veatrice Bourbon) tablet 50 mg, 50 mg, Oral, Q6H, Georganna Skeans, MD, 50 mg at 09/09/20 1093   Physical Exam: Intubated, eyes open spontaneously,  motor 0/5 x4  Anterior Incision: c/d/i Posterior incision c/d/i no drainage, c/d/i  Assessment & Plan: 54 y.o. man s/p [facility] w/ severe hyperextension injury s/p C4-5 ACDF and C3-T2 posterior instrumented fusion, post-op still C5 ASIA A. Post-op with some CSF leak from posterior incision, 10/14 started diamox, 10/16 leak stopped  -elevated HOB as tolerated -call with any questions / concerns regarding repeat wound drainage  Judith Part, MD 09/09/20 8:44 AM

## 2020-09-09 NOTE — Progress Notes (Signed)
Trauma/Critical Care Follow Up Note  Subjective:    Overnight Issues:   Objective:  Vital signs for last 24 hours: Temp:  [97.5 F (36.4 C)-98.6 F (37 C)] 97.7 F (36.5 C) (10/20 0808) Pulse Rate:  [43-52] 46 (10/20 1125) Resp:  [13-26] 23 (10/20 1125) BP: (91-118)/(50-66) 103/52 (10/20 1125) SpO2:  [91 %-100 %] 95 % (10/20 1125) FiO2 (%):  [40 %-100 %] 70 % (10/20 1125) Weight:  [97.5 kg] 97.5 kg (10/20 0500)  Hemodynamic parameters for last 24 hours:    Intake/Output from previous day: 10/19 0701 - 10/20 0700 In: 2376.1 [I.V.:881.1; NG/GT:1495] Out: 1850 [Urine:1850]  Intake/Output this shift: Total I/O In: 138.5 [I.V.:8.5; NG/GT:130] Out: 675 [Urine:675]  Vent settings for last 24 hours: Vent Mode: PRVC FiO2 (%):  [40 %-100 %] 70 % Set Rate:  [20 bmp] 20 bmp Vt Set:  [550 mL] 550 mL PEEP:  [8 cmH20-12 cmH20] 12 cmH20 Plateau Pressure:  [21 cmH20-30 cmH20] 25 cmH20  Physical Exam:  Gen: comfortable, no distress Neuro: does not follow commands HEENT: intubated Neck: supple, surgical site healing well CV: RRR Pulm: unlabored breathing, mechanically ventilated Abd: soft, nontender GU: clear, yellow urine Extr: wwp, no edema   Results for orders placed or performed during the hospital encounter of 09/11/2020 (from the past 24 hour(s))  Glucose, capillary     Status: Abnormal   Collection Time: 09/08/20 12:16 PM  Result Value Ref Range   Glucose-Capillary 145 (H) 70 - 99 mg/dL  Glucose, capillary     Status: Abnormal   Collection Time: 09/08/20  4:06 PM  Result Value Ref Range   Glucose-Capillary 134 (H) 70 - 99 mg/dL  Glucose, capillary     Status: Abnormal   Collection Time: 09/08/20  7:51 PM  Result Value Ref Range   Glucose-Capillary 137 (H) 70 - 99 mg/dL  Glucose, capillary     Status: Abnormal   Collection Time: 09/08/20 11:35 PM  Result Value Ref Range   Glucose-Capillary 144 (H) 70 - 99 mg/dL  Glucose, capillary     Status: Abnormal    Collection Time: 09/09/20  3:36 AM  Result Value Ref Range   Glucose-Capillary 104 (H) 70 - 99 mg/dL  Triglycerides     Status: None   Collection Time: 09/09/20  3:42 AM  Result Value Ref Range   Triglycerides 67 <150 mg/dL  Glucose, capillary     Status: Abnormal   Collection Time: 09/09/20  8:29 AM  Result Value Ref Range   Glucose-Capillary 169 (H) 70 - 99 mg/dL    Assessment & Plan: The plan of care was discussed with the bedside nurse for the day, who is in agreement with this plan and no additional concerns were raised.   Present on Admission: . Quadriparesis (HCC)    LOS: 10 days   Additional comments:I reviewed the patient's new clinical lab test results.   and I reviewed the patients new imaging test results.    [facility]  C5 FX with quadriparesis- S/P C4-5 ACDF and C3-T2 PSIF 10/10 by Dr. Maurice Small. Diamox added for CSF leak, d/c 10/19 Acute hypoxic ventilator dependent respiratory failure-weanFiO2andPEEPto maintain sats >92%. Will need trach/peg, when vent settings lower Trace R PTX- no PTX on F/UCXR Pre-existing medical problems- restarted home valproate, neurontin, synthroid, seroquel, effexor. Holding VPA/ser/effexor per family request. FEN- TF Foley - Q6 I/O ABL anemia- hgb stable VTE- PAS, LMWH Dispo- ICU  Critical Care Total Time: 40 minutes  Diamantina Monks, MD Trauma &  General Surgery Please use [email or web address] to contact on call provider  09/09/2020  *Care during the described time interval was provided by me. I have reviewed this patient's available data, including medical history, events of note, physical examination and test results as part of my evaluation.

## 2020-09-10 ENCOUNTER — Inpatient Hospital Stay (HOSPITAL_COMMUNITY): Payer: Medicare Other | Admitting: Certified Registered Nurse Anesthetist

## 2020-09-10 ENCOUNTER — Inpatient Hospital Stay (HOSPITAL_COMMUNITY): Payer: Medicare Other

## 2020-09-10 LAB — GLUCOSE, CAPILLARY
Glucose-Capillary: 145 mg/dL — ABNORMAL HIGH (ref 70–99)
Glucose-Capillary: 180 mg/dL — ABNORMAL HIGH (ref 70–99)
Glucose-Capillary: 108 mg/dL — ABNORMAL HIGH (ref 70–99)
Glucose-Capillary: 130 mg/dL — ABNORMAL HIGH (ref 70–99)
Glucose-Capillary: 144 mg/dL — ABNORMAL HIGH (ref 70–99)
Glucose-Capillary: 128 mg/dL — ABNORMAL HIGH (ref 70–99)

## 2020-09-10 LAB — BASIC METABOLIC PANEL
Anion gap: 9 (ref 5–15)
BUN: 62 mg/dL — ABNORMAL HIGH (ref 6–20)
CO2: 28 mmol/L (ref 22–32)
Calcium: 8.9 mg/dL (ref 8.9–10.3)
Chloride: 116 mmol/L — ABNORMAL HIGH (ref 98–111)
Creatinine, Ser: 1.07 mg/dL (ref 0.61–1.24)
GFR, Estimated: >60 mL/min (ref >60)
Glucose, Bld: 126 mg/dL — ABNORMAL HIGH (ref 70–99)
Potassium: 3.7 mmol/L (ref 3.5–5.1)
Sodium: 153 mmol/L — ABNORMAL HIGH (ref 135–145)

## 2020-09-10 MED ORDER — PROPOFOL 10 MG/ML IV BOLUS
INTRAVENOUS | Status: DC | PRN
  Administered 2020-09-10: 40 mg via INTRAVENOUS

## 2020-09-10 MED ORDER — FUROSEMIDE 10 MG/ML IJ SOLN
40.0000 mg | Freq: Once | INTRAMUSCULAR | Status: AC
  Administered 2020-09-10: 40 mg via INTRAVENOUS
  Filled 2020-09-10: qty 4

## 2020-09-10 MED ORDER — ROCURONIUM BROMIDE 100 MG/10ML IV SOLN
INTRAVENOUS | Status: DC | PRN
  Administered 2020-09-10: 60 mg via INTRAVENOUS

## 2020-09-10 MED ORDER — EPHEDRINE SULFATE-NACL 50-0.9 MG/10ML-% IV SOSY
PREFILLED_SYRINGE | INTRAVENOUS | Status: DC | PRN
  Administered 2020-09-10 (×2): 10 mg via INTRAVENOUS

## 2020-09-10 NOTE — Anesthesia Procedure Notes (Signed)
Procedure Name: Intubation Date/Time: 09/10/2020 10:57 AM Performed by: Verdie Drown, CRNA Pre-anesthesia Checklist: Patient identified, Emergency Drugs available, Suction available and Patient being monitored Patient Re-evaluated:Patient Re-evaluated prior to induction Oxygen Delivery Method: Ambu bag Preoxygenation: Pre-oxygenation with 100% oxygen Induction Type: IV induction Laryngoscope Size: Mac, 4 and Glidescope Grade View: Grade I Tube type: Oral Tube size: 8.0 mm Number of attempts: 1 Airway Equipment and Method: Stylet,  Oral airway and Video-laryngoscopy Placement Confirmation: ETT inserted through vocal cords under direct vision,  positive ETCO2 and breath sounds checked- equal and bilateral Secured at: 23 cm Tube secured with: Tape Dental Injury: Teeth and Oropharynx as per pre-operative assessment  Difficulty Due To: Difficulty was anticipated and Difficult Airway- due to reduced neck mobility Comments: ETT unchanged over [facility] with Glidescope [facility] for G1 view. Atraumatic intubation.

## 2020-09-10 NOTE — Progress Notes (Signed)
Posterior neck incision leaking a clear pink tinged fluid, Dr. Maurice Small notified.
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
## 2020-09-10 NOTE — Transfer of Care (Signed)
Immediate Anesthesia Transfer of Care Note  Patient: Aaron [name]  Procedure(s) Performed: AN AD HOC INTUBATION  Patient Location: ICU  Anesthesia Type:General  Level of Consciousness: sedated  Airway & Oxygen Therapy: Patient remains intubated per anesthesia plan and Patient placed on Ventilator (see vital sign flow sheet for setting)  Post-op Assessment: Report given to RN  Post vital signs: Reviewed and stable  Last Vitals:  Vitals Value Taken Time  BP 119/53 09/10/20 1100  Temp    Pulse 55 09/10/20 1110  Resp 18 09/10/20 1110  SpO2 92 % 09/10/20 1110  Vitals shown include unvalidated device data.  Last Pain:  Vitals:   09/10/20 0800  TempSrc: Axillary  PainSc:          Complications: No complications documented.

## 2020-09-10 NOTE — Progress Notes (Signed)
Attempted video chat with son's mom, sent link x 2 waited for 9 minutes with no response, bedside RN made aware

## 2020-09-10 NOTE — Progress Notes (Signed)
Patient ID: Aaron [name], male   DOB: 08/14/1966, 54 y.o.   MRN: [id number] Follow up - Trauma Critical Care  Patient Details:    Aaron [name] is an 54 y.o. male.  Lines/tubes : Airway 7.5 mm (Active)  Secured at (cm) 26 cm 09/10/20 0815  Measured From Lips 09/10/20 0815  Secured Location Left 09/10/20 0815  Secured By [employer] 09/10/20 0815  Tube Holder Repositioned Yes 09/10/20 0815  Cuff Pressure (cm H2O) 27 cm H2O 09/10/20 0030  Site Condition Cool;Dry 09/10/20 0431     NG/OG Tube Orogastric Xray Measured external length of tube (Active)  External Length of Tube (cm) - (if applicable) 62 cm 09/09/20 2000  Site Assessment Clean;Dry;Intact 09/10/20 0800  Ongoing Placement Verification No change in cm markings or external length of tube from initial placement;No change in respiratory status;No acute changes, not attributed to clinical condition 09/10/20 0800  Status Infusing tube feed 09/10/20 0800    Microbiology/Sepsis markers: Results for orders placed or performed during the hospital encounter of 08/26/2020  Respiratory Panel by RT PCR (Flu A&B, Covid) - Nasopharyngeal Swab     Status: None   Collection Time: 09/18/2020  1:42 AM   Specimen: Nasopharyngeal Swab  Result Value Ref Range Status   SARS Coronavirus 2 by RT PCR NEGATIVE NEGATIVE Final    Comment: (NOTE) SARS-CoV-2 target nucleic acids are NOT DETECTED.  The SARS-CoV-2 RNA is generally detectable in upper respiratoy specimens during the acute phase of infection. The lowest concentration of SARS-CoV-2 viral copies this assay can detect is 131 copies/mL. A negative result does not preclude SARS-Cov-2 infection and should not be used as the sole basis for treatment or other patient management decisions. A negative result may occur with  improper specimen collection/handling, submission of specimen other than nasopharyngeal swab, presence of viral mutation(s) within the areas targeted by this assay, and  inadequate number of viral copies (<131 copies/mL). A negative result must be combined with clinical observations, patient history, and epidemiological information. The expected result is Negative.  Fact Sheet for Patients:  https://www.moore.com/  Fact Sheet for Healthcare Providers:  https://www.young.biz/  This test is no t yet approved or cleared by the [place] FDA and  has been authorized for detection and/or diagnosis of SARS-CoV-2 by FDA under an Emergency Use Authorization (EUA). This EUA will remain  in effect (meaning this test can be used) for the duration of the COVID-19 declaration under Section 564(b)(1) of the Act, 21 U.S.C. section 360bbb-3(b)(1), unless the authorization is terminated or revoked sooner.     Influenza A by PCR NEGATIVE NEGATIVE Final   Influenza B by PCR NEGATIVE NEGATIVE Final    Comment: (NOTE) The Xpert Xpress SARS-CoV-2/FLU/RSV assay is intended as an aid in  the diagnosis of influenza from Nasopharyngeal swab specimens and  should not be used as a sole basis for treatment. Nasal washings and  aspirates are unacceptable for Xpert Xpress SARS-CoV-2/FLU/RSV  testing.  Fact Sheet for Patients: https://www.moore.com/  Fact Sheet for Healthcare Providers: https://www.young.biz/  This test is not yet approved or cleared by the [place] FDA and  has been authorized for detection and/or diagnosis of SARS-CoV-2 by  FDA under an Emergency Use Authorization (EUA). This EUA will remain  in effect (meaning this test can be used) for the duration of the  Covid-19 declaration under Section 564(b)(1) of the Act, 21  U.S.C. section 360bbb-3(b)(1), unless the authorization is  terminated or revoked. Performed at [facility],  [address] [place], [place] [address]   MRSA PCR Screening     Status: None   Collection Time: 09/17/2020 11:48 PM   Specimen: Nasal  Mucosa; Nasopharyngeal  Result Value Ref Range Status   MRSA by PCR NEGATIVE NEGATIVE Final    Comment:        The GeneXpert MRSA Assay (FDA approved for NASAL specimens only), is one component of a comprehensive MRSA colonization surveillance program. It is not intended to diagnose MRSA infection nor to guide or monitor treatment for MRSA infections. Performed at [facility], [address], [place], [place] [address]   Culture, respiratory (non-expectorated)     Status: None   Collection Time: 09/01/20  1:50 PM   Specimen: Tracheal Aspirate; Respiratory  Result Value Ref Range Status   Specimen Description TRACHEAL ASPIRATE  Final   Special Requests NONE  Final   Gram Stain   Final    FEW WBC PRESENT,[name] PMN AND MONONUCLEAR RARE GRAM POSITIVE COCCI    Culture   Final    Normal respiratory flora-no Staph aureus or Pseudomonas seen Performed at [facility], [address], [place], [place] [address]    Report Status 09/04/2020 FINAL  Final    Anti-infectives:  Anti-infectives (From admission, onward)   Start     Dose/Rate Route Frequency Ordered Stop   09/01/20 1000  ceFEPIme (MAXIPIME) 2 g in sodium chloride 0.9 % 100 mL IVPB  Status:  Discontinued        2 g 200 mL/hr over 30 Minutes Intravenous Every 8 hours 09/01/20 0906 09/04/20 1114      Best Practice/Protocols:  VTE Prophylaxis: Lovenox (prophylaxtic dose) Intermittent Sedation  Consults: Treatment Team:  Jadene Pierini, MD    Studies:    Events:  Subjective:    Overnight Issues:   Objective:  Vital signs for last 24 hours: Temp:  [98 F (36.7 C)-98.8 F (37.1 C)] 98.1 F (36.7 C) (10/21 0800) Pulse Rate:  [40-54] 47 (10/21 0800) Resp:  [11-23] 19 (10/21 0800) BP: (101-119)/(52-67) 109/61 (10/21 0800) SpO2:  [92 %-97 %] 96 % (10/21 0815) FiO2 (%):  [60 %-70 %] 60 % (10/21 0815) Weight:  [97 kg] 97 kg (10/21 0500)  Hemodynamic parameters for last 24 hours:     Intake/Output from previous day: 10/20 0701 - 10/21 0700 In: 1910.2 [I.V.:350.2; NG/GT:1560] Out: 2175 [Urine:2175]  Intake/Output this shift: Total I/O In: 121.5 [I.V.:56.5; NG/GT:65] Out: -   Vent settings for last 24 hours: Vent Mode: PRVC FiO2 (%):  [60 %-70 %] 60 % Set Rate:  [20 bmp] 20 bmp Vt Set:  [510 mL-550 mL] 510 mL PEEP:  [12 cmH20] 12 cmH20 Plateau Pressure:  [22 cmH20-28 cmH20] 22 cmH20  Physical Exam:  General: on vent Neuro: quad, when stimulated just shakes head back and forth - will not interact HEENT/Neck: neck incision Resp: some rhonchi CVS: RRR GI: soft, nt Extremities: edema 1+, scrotal edema  Results for orders placed or performed during the hospital encounter of 09/02/2020 (from the past 24 hour(s))  Glucose, capillary     Status: Abnormal   Collection Time: 09/09/20 12:30 PM  Result Value Ref Range   Glucose-Capillary 158 (H) 70 - 99 mg/dL  Glucose, capillary     Status: Abnormal   Collection Time: 09/09/20  4:02 PM  Result Value Ref Range   Glucose-Capillary 127 (H) 70 - 99 mg/dL  Glucose, capillary     Status: Abnormal  Collection Time: 09/09/20  7:56 PM  Result Value Ref Range   Glucose-Capillary 151 (H) 70 - 99 mg/dL  Glucose, capillary     Status: Abnormal   Collection Time: 09/09/20 11:35 PM  Result Value Ref Range   Glucose-Capillary 154 (H) 70 - 99 mg/dL  Glucose, capillary     Status: Abnormal   Collection Time: 09/10/20  3:39 AM  Result Value Ref Range   Glucose-Capillary 145 (H) 70 - 99 mg/dL  Glucose, capillary     Status: Abnormal   Collection Time: 09/10/20  8:23 AM  Result Value Ref Range   Glucose-Capillary 180 (H) 70 - 99 mg/dL    Assessment & Plan: Present on Admission: . Quadriparesis (HCC)    LOS: 11 days   Additional comments:I reviewed the patients new imaging test results. [name] [facility]  C5 FX with quadriparesis- S/P C4-5 ACDF and C3-T2 PSIF 10/10 by Dr. Maurice Small. Diamox added for CSF leak, d/c 10/19 Acute  hypoxic ventilator dependent respiratory failure-weanPEEP to 10, ETT has cuff leak so I asked anesthesia to change tube Trace R PTX- no PTX on F/UCXR Pre-existing medical problems- restarted home valproate, neurontin, synthroid, seroquel, effexor. Holding VPA/ser/effexor per family request. FEN- TF, lasix x 1 now, BMET now Foley - out, Q6 I/O ID - CBC now, off ABX ABL anemia- hgb stable VTE- PAS, LMWH Dispo- ICU, further goals of care discussion with family Critical Care Total Time*: 64 Minutes  Violeta Gelinas, MD, MPH, FACS Trauma & General Surgery Use [email or web address] to contact on call provider  09/10/2020  *Care during the described time interval was provided by me. I have reviewed this patient's available data, including medical history, events of note, physical examination and test results as part of my evaluation.

## 2020-09-10 NOTE — Progress Notes (Signed)
PT Cancellation Note  Patient Details Name: Aaron [name] MRN: [id number] DOB: 1966/01/23   Cancelled Treatment:    Reason Eval/Treat Not Completed: Medical issues which prohibited therapy - 60% FiO2 on vent, and pt lethargic when PT checked in. Will check back tomorrow.   Richrd Sox, PT Acute Rehabilitation Services Pager [phone]  Office [phone]     Nicola Police 09/10/2020, 4:33 PM

## 2020-09-11 LAB — BASIC METABOLIC PANEL
Anion gap: 8 (ref 5–15)
BUN: 59 mg/dL — ABNORMAL HIGH (ref 6–20)
CO2: 28 mmol/L (ref 22–32)
Calcium: 8.7 mg/dL — ABNORMAL LOW (ref 8.9–10.3)
Chloride: 117 mmol/L — ABNORMAL HIGH (ref 98–111)
Creatinine, Ser: 0.89 mg/dL (ref 0.61–1.24)
GFR, Estimated: >60 mL/min (ref >60)
Glucose, Bld: 145 mg/dL — ABNORMAL HIGH (ref 70–99)
Potassium: 3.4 mmol/L — ABNORMAL LOW (ref 3.5–5.1)
Sodium: 153 mmol/L — ABNORMAL HIGH (ref 135–145)

## 2020-09-11 LAB — CBC
HCT: 25.8 % — ABNORMAL LOW (ref 39.0–52.0)
Hemoglobin: 7.8 g/dL — ABNORMAL LOW (ref 13.0–17.0)
MCH: 31.6 pg (ref 26.0–34.0)
MCHC: 30.2 g/dL (ref 30.0–36.0)
MCV: 104.5 fL — ABNORMAL HIGH (ref 80.0–100.0)
Platelets: 226 10*3/uL (ref 150–400)
RBC: 2.47 MIL/uL — ABNORMAL LOW (ref 4.22–5.81)
RDW: 18.1 % — ABNORMAL HIGH (ref 11.5–15.5)
WBC: 19 10*3/uL — ABNORMAL HIGH (ref 4.0–10.5)
nRBC: 2.6 % — ABNORMAL HIGH (ref 0.0–0.2)

## 2020-09-11 LAB — GLUCOSE, CAPILLARY
Glucose-Capillary: 146 mg/dL — ABNORMAL HIGH (ref 70–99)
Glucose-Capillary: 117 mg/dL — ABNORMAL HIGH (ref 70–99)
Glucose-Capillary: 128 mg/dL — ABNORMAL HIGH (ref 70–99)
Glucose-Capillary: 126 mg/dL — ABNORMAL HIGH (ref 70–99)
Glucose-Capillary: 140 mg/dL — ABNORMAL HIGH (ref 70–99)
Glucose-Capillary: 146 mg/dL — ABNORMAL HIGH (ref 70–99)

## 2020-09-11 MED ORDER — POTASSIUM CHLORIDE 20 MEQ PO PACK
40.0000 meq | PACK | Freq: Two times a day (BID) | ORAL | Status: AC
  Administered 2020-09-11 (×2): 40 meq
  Filled 2020-09-11 (×2): qty 2

## 2020-09-11 MED ORDER — DOCUSATE SODIUM 50 MG/5ML PO LIQD
100.0000 mg | Freq: Two times a day (BID) | ORAL | Status: DC
  Administered 2020-09-12 – 2020-09-21 (×12): 100 mg
  Filled 2020-09-11 (×12): qty 10

## 2020-09-11 MED ORDER — FREE WATER
200.0000 mL | Freq: Three times a day (TID) | Status: DC
  Administered 2020-09-11 – 2020-09-12 (×3): 200 mL

## 2020-09-11 MED ORDER — IPRATROPIUM-ALBUTEROL 0.5-2.5 (3) MG/3ML IN SOLN
3.0000 mL | Freq: Four times a day (QID) | RESPIRATORY_TRACT | Status: DC
  Administered 2020-09-11 – 2020-09-18 (×27): 3 mL via RESPIRATORY_TRACT
  Filled 2020-09-11 (×26): qty 3

## 2020-09-11 MED ORDER — TRAMADOL HCL 50 MG PO TABS
50.0000 mg | ORAL_TABLET | Freq: Four times a day (QID) | ORAL | Status: DC
  Administered 2020-09-11 – 2020-09-21 (×39): 50 mg
  Filled 2020-09-11 (×39): qty 1

## 2020-09-11 MED ORDER — GUAIFENESIN 100 MG/5ML PO SOLN
15.0000 mL | Freq: Four times a day (QID) | ORAL | Status: DC
  Administered 2020-09-11 – 2020-09-21 (×39): 300 mg
  Filled 2020-09-11 (×39): qty 15

## 2020-09-11 MED ORDER — POLYETHYLENE GLYCOL 3350 17 G PO PACK
17.0000 g | PACK | Freq: Every day | ORAL | Status: DC
  Administered 2020-09-12 – 2020-09-21 (×6): 17 g
  Filled 2020-09-11 (×6): qty 1

## 2020-09-11 MED ORDER — SODIUM CHLORIDE 0.9 % IV SOLN
2.0000 g | Freq: Three times a day (TID) | INTRAVENOUS | Status: AC
  Administered 2020-09-11 – 2020-09-16 (×15): 2 g via INTRAVENOUS
  Filled 2020-09-11 (×15): qty 2

## 2020-09-11 MED ORDER — FUROSEMIDE 10 MG/ML IJ SOLN
40.0000 mg | Freq: Once | INTRAMUSCULAR | Status: AC
  Administered 2020-09-11: 40 mg via INTRAVENOUS
  Filled 2020-09-11: qty 4

## 2020-09-11 NOTE — Progress Notes (Addendum)
Patient ID: Aaron [name], male   DOB: 1966/01/13, 54 y.o.   MRN: [id number] Follow up - Trauma Critical Care  Patient Details:    Aaron [name] is an 54 y.o. male.  Lines/tubes : Airway 8 mm (Active)  Secured at (cm) 25 cm 09/11/20 0752  Measured From Lips 09/11/20 0752  Secured Location Right 09/11/20 0752  Secured By [employer] Tube Holder 09/11/20 0752  Tube Holder Repositioned Yes 09/11/20 0752  Cuff Pressure (cm H2O) 23 cm H2O 09/10/20 2052  Site Condition Dry 09/11/20 0752     NG/OG Tube Orogastric Xray Measured external length of tube (Active)  External Length of Tube (cm) - (if applicable) 62 cm 09/09/20 2000  Site Assessment Clean;Dry;Intact 09/10/20 2000  Ongoing Placement Verification No change in cm markings or external length of tube from initial placement;No change in respiratory status;No acute changes, not attributed to clinical condition 09/10/20 2000  Status Infusing tube feed 09/10/20 2000    Microbiology/Sepsis markers: Results for orders placed or performed during the hospital encounter of August 05, 2020  Respiratory Panel by RT PCR (Flu A&B, Covid) - Nasopharyngeal Swab     Status: None   Collection Time: August 05, 2020  1:42 AM   Specimen: Nasopharyngeal Swab  Result Value Ref Range Status   SARS Coronavirus 2 by RT PCR NEGATIVE NEGATIVE Final    Comment: (NOTE) SARS-CoV-2 target nucleic acids are NOT DETECTED.  The SARS-CoV-2 RNA is generally detectable in upper respiratoy specimens during the acute phase of infection. The lowest concentration of SARS-CoV-2 viral copies this assay can detect is 131 copies/mL. A negative result does not preclude SARS-Cov-2 infection and should not be used as the sole basis for treatment or other patient management decisions. A negative result may occur with  improper specimen collection/handling, submission of specimen other than nasopharyngeal swab, presence of viral mutation(s) within the areas targeted by this assay, and  inadequate number of viral copies (<131 copies/mL). A negative result must be combined with clinical observations, patient history, and epidemiological information. The expected result is Negative.  Fact Sheet for Patients:  https://www.moore.com/https://www.fda.gov/media/142436/download  Fact Sheet for Healthcare Providers:  https://www.young.biz/https://www.fda.gov/media/142435/download  This test is no t yet approved or cleared by the [place] FDA and  has been authorized for detection and/or diagnosis of SARS-CoV-2 by FDA under an Emergency Use Authorization (EUA). This EUA will remain  in effect (meaning this test can be used) for the duration of the COVID-19 declaration under Section 564(b)(1) of the Act, 21 U.S.C. section 360bbb-3(b)(1), unless the authorization is terminated or revoked sooner.     Influenza A by PCR NEGATIVE NEGATIVE Final   Influenza B by PCR NEGATIVE NEGATIVE Final    Comment: (NOTE) The Xpert Xpress SARS-CoV-2/FLU/RSV assay is intended as an aid in  the diagnosis of influenza from Nasopharyngeal swab specimens and  should not be used as a sole basis for treatment. Nasal washings and  aspirates are unacceptable for Xpert Xpress SARS-CoV-2/FLU/RSV  testing.  Fact Sheet for Patients: https://www.moore.com/https://www.fda.gov/media/142436/download  Fact Sheet for Healthcare Providers: https://www.young.biz/https://www.fda.gov/media/142435/download  This test is not yet approved or cleared by the [place] FDA and  has been authorized for detection and/or diagnosis of SARS-CoV-2 by  FDA under an Emergency Use Authorization (EUA). This EUA will remain  in effect (meaning this test can be used) for the duration of the  Covid-19 declaration under Section 564(b)(1) of the Act, 21  U.S.C. section 360bbb-3(b)(1), unless the authorization is  terminated or revoked. Performed at [facility],  [address], [place], [place] [address]   MRSA PCR Screening     Status: None   Collection Time: 09/02/20 11:48 PM   Specimen: Nasal  Mucosa; Nasopharyngeal  Result Value Ref Range Status   MRSA by PCR NEGATIVE NEGATIVE Final    Comment:        The GeneXpert MRSA Assay (FDA approved for NASAL specimens only), is one component of a comprehensive MRSA colonization surveillance program. It is not intended to diagnose MRSA infection nor to guide or monitor treatment for MRSA infections. Performed at [facility], [address] [place], [place] [address]   Culture, respiratory (non-expectorated)     Status: None   Collection Time: 09/01/20  1:50 PM   Specimen: Tracheal Aspirate; Respiratory  Result Value Ref Range Status   Specimen Description TRACHEAL ASPIRATE  Final   Special Requests NONE  Final   Gram Stain   Final    FEW WBC PRESENT,BOTH PMN AND MONONUCLEAR RARE GRAM POSITIVE COCCI    Culture   Final    Normal respiratory flora-no Staph aureus or Pseudomonas seen Performed at [facility], [address], [place], [place] [address]    Report Status 09/04/2020 FINAL  Final    Anti-infectives:  Anti-infectives (From admission, onward)   Start     Dose/Rate Route Frequency Ordered Stop   09/11/20 1015  ceFEPIme (MAXIPIME) 2 g in sodium chloride 0.9 % 100 mL IVPB        2 g 200 mL/hr over 30 Minutes Intravenous Every 12 hours 09/11/20 0922     09/01/20 1000  ceFEPIme (MAXIPIME) 2 g in sodium chloride 0.9 % 100 mL IVPB  Status:  Discontinued        2 g 200 mL/hr over 30 Minutes Intravenous Every 8 hours 09/01/20 0906 09/04/20 1114     Consults: Treatment Team:  Jadene Pierini, MD   Subjective:    Overnight Issues:   Objective:  Vital signs for last 24 hours: Temp:  [97.8 F (36.6 C)-100.1 F (37.8 C)] 98.3 F (36.8 C) (10/22 0800) Pulse Rate:  [47-54] 47 (10/22 0900) Resp:  [17-25] 21 (10/22 0900) BP: (105-122)/(51-60) 105/57 (10/22 0900) SpO2:  [90 %-96 %] 92 % (10/22 0900) FiO2 (%):  [40 %-60 %] 40 % (10/22 0800)  Hemodynamic parameters for last 24 hours:     Intake/Output from previous day: 10/21 0701 - 10/22 0700 In: 1191.6 [I.V.:476.6; NG/GT:715] Out: 2975 [Urine:2975]  Intake/Output this shift: Total I/O In: 21.4 [I.V.:21.4] Out: -   Vent settings for last 24 hours: Vent Mode: PRVC FiO2 (%):  [40 %-60 %] 40 % Set Rate:  [20 bmp] 20 bmp Vt Set:  [510 mL] 510 mL PEEP:  [10 cmH20] 10 cmH20 Plateau Pressure:  [20 cmH20-24 cmH20] 20 cmH20  Physical Exam:  General: on vent Neuro: quad, shakes head to stim HEENT/Neck: ETT and collar Resp: rhonchi L CVS: RRR 40s GI: soft, NT Extremities: edema 1+ and scrotal edema Correction - NO COLLAR Results for orders placed or performed during the hospital encounter of September 02, 2020 (from the past 24 hour(s))  Basic metabolic panel     Status: Abnormal   Collection Time: 09/10/20 11:18 AM  Result Value Ref Range   Sodium 153 (H) 135 - 145 mmol/L   Potassium 3.7 3.5 - 5.1 mmol/L   Chloride 116 (H) 98 - 111 mmol/L   CO2 28 22 - 32 mmol/L   Glucose, Bld 126 (H) 70 -  99 mg/dL   BUN 62 (H) 6 - 20 mg/dL   Creatinine, Ser 4.09 0.61 - 1.24 mg/dL   Calcium 8.9 8.9 - 81.1 mg/dL   GFR, Estimated >91 >47 mL/min   Anion gap 9 5 - 15  Glucose, capillary     Status: Abnormal   Collection Time: 09/10/20 12:01 PM  Result Value Ref Range   Glucose-Capillary 108 (H) 70 - 99 mg/dL  Glucose, capillary     Status: Abnormal   Collection Time: 09/10/20  4:03 PM  Result Value Ref Range   Glucose-Capillary 130 (H) 70 - 99 mg/dL  Glucose, capillary     Status: Abnormal   Collection Time: 09/10/20  7:38 PM  Result Value Ref Range   Glucose-Capillary 144 (H) 70 - 99 mg/dL  Glucose, capillary     Status: Abnormal   Collection Time: 09/10/20 11:27 PM  Result Value Ref Range   Glucose-Capillary 128 (H) 70 - 99 mg/dL  Glucose, capillary     Status: Abnormal   Collection Time: 09/11/20  3:24 AM  Result Value Ref Range   Glucose-Capillary 146 (H) 70 - 99 mg/dL  CBC     Status: Abnormal   Collection Time:  09/11/20  4:13 AM  Result Value Ref Range   WBC 19.0 (H) 4.0 - 10.5 K/uL   RBC 2.47 (L) 4.22 - 5.81 MIL/uL   Hemoglobin 7.8 (L) 13.0 - 17.0 g/dL   HCT 82.9 (L) 39 - 52 %   MCV 104.5 (H) 80.0 - 100.0 fL   MCH 31.6 26.0 - 34.0 pg   MCHC 30.2 30.0 - 36.0 g/dL   RDW 56.2 (H) 13.0 - 86.5 %   Platelets 226 150 - 400 K/uL   nRBC 2.6 (H) 0.0 - 0.2 %  Basic metabolic panel     Status: Abnormal   Collection Time: 09/11/20  4:13 AM  Result Value Ref Range   Sodium 153 (H) 135 - 145 mmol/L   Potassium 3.4 (L) 3.5 - 5.1 mmol/L   Chloride 117 (H) 98 - 111 mmol/L   CO2 28 22 - 32 mmol/L   Glucose, Bld 145 (H) 70 - 99 mg/dL   BUN 59 (H) 6 - 20 mg/dL   Creatinine, Ser 7.84 0.61 - 1.24 mg/dL   Calcium 8.7 (L) 8.9 - 10.3 mg/dL   GFR, Estimated >69 >62 mL/min   Anion gap 8 5 - 15  Glucose, capillary     Status: Abnormal   Collection Time: 09/11/20  8:11 AM  Result Value Ref Range   Glucose-Capillary 117 (H) 70 - 99 mg/dL    Assessment & Plan: Present on Admission: . Quadriparesis (HCC)    LOS: 12 days   Additional comments:I reviewed the patient's new clinical lab test results. [name] [facility]  C5 FX with quadriparesis- S/P C4-5 ACDF and C3-T2 PSIF 10/10 by Dr. Maurice Small. Diamox added for CSF leak and leak stopped. Acute hypoxic ventilator dependent respiratory failure-PEEP 10, back up to 60% with PNA, schedule duonebs Trace R PTX- no PTX on F/UCXR Pre-existing medical problems- restarted home valproate, neurontin, synthroid, seroquel, effexor. Holding VPA/ser/effexor per family request. 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 I have reviewed this patient's available data, including medical history, events of note, physical examination and test results as part of my evaluation.

## 2020-09-11 NOTE — Progress Notes (Signed)
Neurosurgery Service Progress Note  Subjective: Some staining on his posterior cervical dressing yesterday but none overnight / this morning  Objective: Vitals:   09/11/20 0500 09/11/20 0600 09/11/20 0700 09/11/20 0752  BP: (!) 113/53 (!) 108/53 (!) 109/55 (!) 109/55  Pulse: (!) 49 (!) 51 (!) 49 (!) 47  Resp: (!) 21 18 (!) 21 (!) 21  Temp:      TempSrc:      SpO2: 95% 96% 95% 95%  Weight:      Height:       Temp (24hrs), Avg:99.1 F (37.3 C), Min:97.8 F (36.6 C), Max:100.1 F (37.8 C)  CBC Latest Ref Rng & Units 09/11/2020 09/07/2020 09/06/2020  WBC 4.0 - 10.5 K/uL 19.0(H) 9.2 8.8  Hemoglobin 13.0 - 17.0 g/dL 7.8(L) 7.8(L) 8.1(L)  Hematocrit 39 - 52 % 25.8(L) 25.4(L) 25.8(L)  Platelets 150 - 400 K/uL 226 191 173   BMP Latest Ref Rng & Units 09/11/2020 09/10/2020 09/07/2020  Glucose 70 - 99 mg/dL 145(H) 126(H) 129(H)  BUN 6 - 20 mg/dL 59(H) 62(H) 50(H)  Creatinine 0.61 - 1.24 mg/dL 0.89 1.07 0.73  Sodium 135 - 145 mmol/L 153(H) 153(H) 146(H)  Potassium 3.5 - 5.1 mmol/L 3.4(L) 3.7 4.3  Chloride 98 - 111 mmol/L 117(H) 116(H) 118(H)  CO2 22 - 32 mmol/L 28 28 19(L)  Calcium 8.9 - 10.3 mg/dL 8.7(L) 8.9 8.3(L)    Intake/Output Summary (Last 24 hours) at 09/11/2020 0824 Last data filed at 09/11/2020 0700 Gross per 24 hour  Intake 1070.12 ml  Output 2975 ml  Net -1904.88 ml    Current Facility-Administered Medications:  .  acetaminophen (TYLENOL) tablet 1,000 mg, 1,000 mg, Per Tube, Q6H, Jesusita Oka, MD, 1,000 mg at 09/11/20 0508 .  bisacodyl (DULCOLAX) suppository 10 mg, 10 mg, Rectal, Daily, Jesusita Oka, MD, 10 mg at 09/07/20 1027 .  chlorhexidine gluconate (MEDLINE KIT) (PERIDEX) 0.12 % solution 15 mL, 15 mL, Mouth Rinse, BID, Kae Heller, Chelsea A, MD, 15 mL at 09/10/20 2000 .  Chlorhexidine Gluconate Cloth 2 % PADS 6 each, 6 each, Topical, Daily, Judith Part, MD, 6 each at 09/11/20 [phone] .  docusate (COLACE) 50 MG/5ML liquid 100 mg, 100 mg, Oral, BID,  Greer Pickerel, MD, 100 mg at 09/10/20 0934 .  enoxaparin (LOVENOX) injection 40 mg, 40 mg, Subcutaneous, Q12H, Jesusita Oka, MD, 40 mg at 09/10/20 2230 .  feeding supplement (PIVOT 1.5 CAL) liquid 1,000 mL, 1,000 mL, Per Tube, Continuous, Georganna Skeans, MD, Last Rate: 65 mL/hr at 09/11/20 0700, Rate Verify at 09/11/20 0700 .  feeding supplement (PROSource TF) liquid 45 mL, 45 mL, Per Tube, Daily, Georganna Skeans, MD, 45 mL at 09/10/20 0943 .  fentaNYL (SUBLIMAZE) bolus via infusion 50 mcg, 50 mcg, Intravenous, Q15 min PRN, Kae Heller, Chelsea A, MD .  fentaNYL 2561mg in NS 252m(1078mml) infusion-PREMIX, 50-200 mcg/hr, Intravenous, Continuous, ConRomana Juniper MD, Last Rate: 7.5 mL/hr at 09/11/20 0700, 75 mcg/hr at 09/11/20 0700 .  gabapentin (NEURONTIN) 250 MG/5ML solution 600 mg, 600 mg, Per Tube, Q8H, ThoGeorganna SkeansD, 600 mg at 09/11/20 0507 .  guaiFENesin (ROBITUSSIN) 100 MG/5ML solution 300 mg, 15 mL, Oral, Q6H, ThoGeorganna SkeansD, 300 mg at 09/11/20 0508 .  ipratropium-albuterol (DUONEB) 0.5-2.5 (3) MG/3ML nebulizer solution 3 mL, 3 mL, Nebulization, Q6H PRN, ThoGeorganna SkeansD, 3 mL at 08/31/20 1707 .  levothyroxine (SYNTHROID) tablet 75 mcg, 75 mcg, Per Tube, Q0600, ThoGeorganna SkeansD, 75 mcg at 09/11/20 0508 .  MEDLINE mouth  rinse, 15 mL, Mouth Rinse, 10 times per day, Romana Juniper A, MD, 15 mL at 09/11/20 0327 .  methocarbamol (ROBAXIN) tablet 1,000 mg, 1,000 mg, Per Tube, Q8H, Jesusita Oka, MD, 1,000 mg at 09/11/20 0324 .  morphine 4 MG/ML injection 4 mg, 4 mg, Intravenous, Q2H PRN, Georganna Skeans, MD, 4 mg at 08/31/20 2007 .  ondansetron (ZOFRAN-ODT) disintegrating tablet 4 mg, 4 mg, Oral, Q6H PRN **OR** ondansetron (ZOFRAN) injection 4 mg, 4 mg, Intravenous, Q6H PRN, Georganna Skeans, MD .  oxyCODONE (Oxy IR/ROXICODONE) immediate release tablet 5-10 mg, 5-10 mg, Per Tube, Q4H PRN, Jesusita Oka, MD, 10 mg at 09/10/20 0935 .  pantoprazole sodium (PROTONIX) 40 mg/20  mL oral suspension 40 mg, 40 mg, Per Tube, Daily, Georganna Skeans, MD, 40 mg at 09/10/20 0934 .  polyethylene glycol (MIRALAX / GLYCOLAX) packet 17 g, 17 g, Oral, Daily, Greer Pickerel, MD, 17 g at 09/09/20 0955 .  propofol (DIPRIVAN) 1000 MG/100ML infusion, 0-50 mcg/kg/min, Intravenous, Continuous, Connor, Chelsea A, MD, Last Rate: 13.88 mL/hr at 09/11/20 0700, 30 mcg/kg/min at 09/11/20 0700 .  sodium chloride flush (NS) 0.9 % injection 10-40 mL, 10-40 mL, Intracatheter, Q12H, Georganna Skeans, MD, 10 mL at 09/10/20 0939 .  sodium chloride flush (NS) 0.9 % injection 10-40 mL, 10-40 mL, Intracatheter, PRN, Georganna Skeans, MD .  traMADol Veatrice Bourbon) tablet 50 mg, 50 mg, Oral, Q6H, Georganna Skeans, MD, 50 mg at 09/11/20 0174   Physical Exam: Intubated, eyes open spontaneously,  motor 0/5 x4  Anterior Incision: c/d/i Posterior incision c/d/i no drainage, c/d/i  Assessment & Plan: 54 y.o. man s/p [facility] w/ severe hyperextension injury s/p C4-5 ACDF and C3-T2 posterior instrumented fusion, post-op still C5 ASIA A. Post-op with some CSF leak from posterior incision, 10/14 started diamox, 10/16 leak stopped  -elevated HOB as tolerated -call with any questions / concerns regarding repeat wound drainage  Judith Part, MD 09/11/20 8:24 AM

## 2020-09-11 NOTE — Progress Notes (Signed)
Physical Therapy Treatment Patient Details Name: Aaron [name] MRN: [id number] DOB: Oct 18, 1966 Today's Date: 09/11/2020    History of Present Illness This 54 y.o. male admitted after [facility] - no LOC, but unable to move Bil. UEs and LEs.  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  He underwent C4-5 ACDF, C3,C4,C5, T1,T2 laminectomies, C3-T2 PLIF.  Pt remained intubated post op.  Pt  PMH includes: Bipolar disorder, HTN; s/p ACDF C5-C7    PT Comments    Pt continues to present with lethargy and little to no interaction with PT. Pt shakes head "no" throughout session, does not appear to be volitional. No eye opening beyond eyelid fluttering noted today. PT performed UE and LE PROM to promote circulation, decrease edema, and maintain ROM. Pt with significant distal edema, pt performing gentle massage to promote circulation. Pt positioned with LEs in neutral as pt with preference for hip ER, and UEs propped on pillows for edema management. Will continue to follow acutely, will progress mobility as able.  SpO2 84-95% on 60% FiO2, PEEP 10 Other VSS     Follow Up Recommendations  CIR (pending improvement)     Equipment Recommendations  Wheelchair (measurements PT);Wheelchair cushion (measurements PT);Hospital bed    Recommendations for Other Services       Precautions / Restrictions Precautions Precautions: Other (comment);Back;Cervical Precaution Booklet Issued: No Precaution Comments: ETT, swollen scrotum, watch 02 Restrictions Weight Bearing Restrictions: No    Mobility  Bed Mobility                  Transfers                 General transfer comment: positioning and ROM only  Ambulation/Gait                 Stairs             Wheelchair Mobility    Modified Rankin (Stroke Patients Only)       Balance                                             Cognition Arousal/Alertness: Lethargic Behavior During Therapy: Flat affect Overall Cognitive Status: Difficult to assess Area of Impairment: Following commands                       Following Commands: Follows one step commands inconsistently       General Comments: PT asked RN to lift sedation meds, pt with continued lethargy with minimally increased restlessness. Pt shakes head in "no" motion, unsure if this is voluntary. When PT asks pt to nod "yes" pt unable. Eyelids flutter 2/4 trials when PT states "open your eyes". no pain response      Exercises General Exercises - Upper Extremity Shoulder Flexion: PROM;Both;10 reps;Supine (to 75* flexion) Elbow Flexion: PROM;Both;15 reps;Supine Elbow Extension: PROM;Both;15 reps;Supine Wrist Flexion: PROM;Both;15 reps;Supine Wrist Extension: PROM;Both;15 reps;Supine Digit Composite Flexion: PROM;Both;10 reps;Supine Composite Extension: PROM;Both;10 reps;Supine General Exercises - Lower Extremity Ankle Circles/Pumps: PROM;Both;20 reps;Supine Heel Slides: PROM;Both;10 reps;Supine Hip ABduction/ADduction: PROM;Both;15 reps;Supine Other Exercises Other Exercises: Hip IR x15    General Comments General comments (skin integrity, edema, etc.): 60% FiO2, PEEP 10. BP stable with HOB elevation 30>45. SpO2 minimum 84%, RN deep suctioned pt  for recovery to 89-95%.      Pertinent Vitals/Pain Pain Assessment: Faces Faces Pain Scale: Hurts a little bit Pain Location: generalized Pain Descriptors / Indicators: Restless;Other (Comment) (shaking head in "no" motion) Pain Intervention(s): Limited activity within patient's tolerance;Monitored during session    Home Living                      Prior Function            PT Goals (current goals can now be found in the care plan section) Acute Rehab PT Goals PT Goal Formulation: Patient unable to participate in goal setting Time For Goal Achievement: 09/16/20 Potential  to Achieve Goals: Fair Progress towards PT goals: Not progressing toward goals - comment (lethargy)    Frequency    Min 3X/week      PT Plan Current plan remains appropriate    Co-evaluation              AM-PAC PT "6 Clicks" Mobility   Outcome Measure  Help needed turning from your back to your side while in a flat bed without using bedrails?: Total Help needed moving from lying on your back to sitting on the side of a flat bed without using bedrails?: Total Help needed moving to and from a bed to a chair (including a wheelchair)?: Total Help needed standing up from a chair using your arms (e.g., wheelchair or bedside chair)?: Total Help needed to walk in hospital room?: Total Help needed climbing 3-5 steps with a railing? : Total 6 Click Score: 6    End of Session Equipment Utilized During Treatment: Oxygen (ETT) Activity Tolerance: Patient limited by lethargy Patient left: in bed;with call bell/phone within reach;with bed alarm set;with SCD's reapplied (chair position) Nurse Communication: Mobility status;Need for lift equipment PT Visit Diagnosis: Other abnormalities of gait and mobility (R26.89);Muscle weakness (generalized) (M62.81);Other symptoms and signs involving the nervous system (R29.898)     Time: 3235-5732 PT Time Calculation (min) (ACUTE ONLY): 37 min  Charges:  $Therapeutic Exercise: 8-22 mins $Therapeutic Activity: 8-22 mins                     Lyndie Vanderloop E, PT Acute Rehabilitation Services Pager [phone]  Office [phone]     Liliani Bobo D Despina Hidden 09/11/2020, 5:04 PM

## 2020-09-11 NOTE — Consult Note (Addendum)
WOC Nurse Consult Note: Reason for Consult: Consult requested for sacrum.  Pt has been critically ill with multiple systemic factors which can impair healing.  Wound type: Deep tissue pressure injury to sacrum Pressure Injury POA: No Measurement: 8X8 cm in a butterfly shape over sacrum/upper inner gluteal fold Wound bed: dark purple skin with loose peeling edges beginning to lift and reveal red moist wound beds in patchy areas Drainage (amount, consistency, odor) small amt tan drainage, no odor Periwound: intact skin surrounding Dressing procedure/placement/frequency: Topical treatment orders provided for bedside nurses to perform as follows: Foam dressing to sacrum; change Q 3 days or PRN soiling.  Pt is on a low airloss mattress to reduce pressure.  WOC team will reassess the location weekly to determine if a change in the plan of care is indicated at that time.  Cammie Mcgee MSN, RN, CWOCN, [place], CNS [phone]

## 2020-09-12 ENCOUNTER — Inpatient Hospital Stay (HOSPITAL_COMMUNITY): Payer: Medicare Other

## 2020-09-12 LAB — BASIC METABOLIC PANEL
Anion gap: 8 (ref 5–15)
BUN: 61 mg/dL — ABNORMAL HIGH (ref 6–20)
CO2: 29 mmol/L (ref 22–32)
Calcium: 8.9 mg/dL (ref 8.9–10.3)
Chloride: 118 mmol/L — ABNORMAL HIGH (ref 98–111)
Creatinine, Ser: 0.67 mg/dL (ref 0.61–1.24)
GFR, Estimated: >60 mL/min (ref >60)
Glucose, Bld: 115 mg/dL — ABNORMAL HIGH (ref 70–99)
Potassium: 4.6 mmol/L (ref 3.5–5.1)
Sodium: 155 mmol/L — ABNORMAL HIGH (ref 135–145)

## 2020-09-12 LAB — GLUCOSE, CAPILLARY
Glucose-Capillary: 120 mg/dL — ABNORMAL HIGH (ref 70–99)
Glucose-Capillary: 125 mg/dL — ABNORMAL HIGH (ref 70–99)
Glucose-Capillary: 129 mg/dL — ABNORMAL HIGH (ref 70–99)
Glucose-Capillary: 132 mg/dL — ABNORMAL HIGH (ref 70–99)
Glucose-Capillary: 134 mg/dL — ABNORMAL HIGH (ref 70–99)
Glucose-Capillary: 139 mg/dL — ABNORMAL HIGH (ref 70–99)

## 2020-09-12 LAB — CBC
HCT: 27.2 % — ABNORMAL LOW (ref 39.0–52.0)
Hemoglobin: 8.4 g/dL — ABNORMAL LOW (ref 13.0–17.0)
MCH: 32.8 pg (ref 26.0–34.0)
MCHC: 30.9 g/dL (ref 30.0–36.0)
MCV: 106.3 fL — ABNORMAL HIGH (ref 80.0–100.0)
Platelets: 229 10*3/uL (ref 150–400)
RBC: 2.56 MIL/uL — ABNORMAL LOW (ref 4.22–5.81)
RDW: 19 % — ABNORMAL HIGH (ref 11.5–15.5)
WBC: 18 10*3/uL — ABNORMAL HIGH (ref 4.0–10.5)
nRBC: 2.8 % — ABNORMAL HIGH (ref 0.0–0.2)

## 2020-09-12 LAB — TRIGLYCERIDES: Triglycerides: 76 mg/dL (ref <150)

## 2020-09-12 MED ORDER — SODIUM CHLORIDE 0.9 % IV SOLN
INTRAVENOUS | Status: DC | PRN
  Administered 2020-09-12: 250 mL via INTRAVENOUS
  Administered 2020-09-14: 1000 mL via INTRAVENOUS
  Administered 2020-09-17: 250 mL via INTRAVENOUS

## 2020-09-12 MED ORDER — FREE WATER
200.0000 mL | Freq: Four times a day (QID) | Status: DC
  Administered 2020-09-12 – 2020-09-13 (×4): 200 mL

## 2020-09-12 NOTE — Progress Notes (Signed)
Neurosurgery Service Progress Note  Subjective: No reports of leakage / staining overnight  Objective: Vitals:   09/12/20 0600 09/12/20 0700 09/12/20 0800 09/12/20 0804  BP: (!) 108/57 116/65 117/67   Pulse: (!) 46 (!) 45 (!) 50   Resp: _0 Temp:   (!) 97.5 F (36.4 C)   TempSrc:   Axillary   SpO2: 98% 99% 99% 99%  Weight:      Height:       Temp (24hrs), Avg:97.8 F (36.6 C), Min:97.5 F (36.4 C), Max:98.1 F (36.7 C)  CBC Latest Ref Rng & Units 09/11/2020 09/07/2020 09/06/2020  WBC 4.0 - 10.5 K/uL 19.0(H) 9.2 8.8  Hemoglobin 13.0 - 17.0 g/dL 7.8(L) 7.8(L) 8.1(L)  Hematocrit 39 - 52 % 25.8(L) 25.4(L) 25.8(L)  Platelets 150 - 400 K/uL 226 191 173   BMP Latest Ref Rng & Units 09/11/2020 09/10/2020 09/07/2020  Glucose 70 - 99 mg/dL 145(H) 126(H) 129(H)  BUN 6 - 20 mg/dL 59(H) 62(H) 50(H)  Creatinine 0.61 - 1.24 mg/dL 0.89 1.07 0.73  Sodium 135 - 145 mmol/L 153(H) 153(H) 146(H)  Potassium 3.5 - 5.1 mmol/L 3.4(L) 3.7 4.3  Chloride 98 - 111 mmol/L 117(H) 116(H) 118(H)  CO2 22 - 32 mmol/L 28 28 19(L)  Calcium 8.9 - 10.3 mg/dL 8.7(L) 8.9 8.3(L)    Intake/Output Summary (Last 24 hours) at 09/12/2020 0850 Last data filed at 09/12/2020 0800 Gross per 24 hour  Intake 3859.65 ml  Output 3050 ml  Net 809.65 ml    Current Facility-Administered Medications:  .  0.9 %  sodium chloride infusion, , Intravenous, PRN, Ralene Ok, MD, Stopped at 09/12/20 [phone] .  acetaminophen (TYLENOL) tablet 1,000 mg, 1,000 mg, Per Tube, Q6H, Jesusita Oka, MD, 1,000 mg at 09/12/20 0511 .  bisacodyl (DULCOLAX) suppository 10 mg, 10 mg, Rectal, Daily, Jesusita Oka, MD, 10 mg at 09/11/20 1058 .  ceFEPIme (MAXIPIME) 2 g in sodium chloride 0.9 % 100 mL IVPB, 2 g, Intravenous, Q8H, Georganna Skeans, MD, Stopped at 09/12/20 0216 .  chlorhexidine gluconate (MEDLINE KIT) (PERIDEX) 0.12 % solution 15 mL, 15 mL, Mouth Rinse, BID, Romana Juniper A, MD, 15 mL at 09/12/20 0736 .   Chlorhexidine Gluconate Cloth 2 % PADS 6 each, 6 each, Topical, Daily, Judith Part, MD, 6 each at 09/11/20 2045 .  docusate (COLACE) 50 MG/5ML liquid 100 mg, 100 mg, Per Tube, BID, Greer Pickerel, MD .  enoxaparin (LOVENOX) injection 40 mg, 40 mg, Subcutaneous, Q12H, Jesusita Oka, MD, 40 mg at 09/11/20 2225 .  feeding supplement (PIVOT 1.5 CAL) liquid 1,000 mL, 1,000 mL, Per Tube, Continuous, Georganna Skeans, MD, Last Rate: 65 mL/hr at 09/11/20 1459, 1,000 mL at 09/11/20 1459 .  feeding supplement (PROSource TF) liquid 45 mL, 45 mL, Per Tube, Daily, Georganna Skeans, MD, 45 mL at 09/11/20 1056 .  fentaNYL (SUBLIMAZE) bolus via infusion 50 mcg, 50 mcg, Intravenous, Q15 min PRN, Clovis Riley, MD, 50 mcg at 09/12/20 0004 .  fentaNYL 253mg in NS 2526m(1042mml) infusion-PREMIX, 50-200 mcg/hr, Intravenous, Continuous, ConRomana Juniper MD, Last Rate: 7.5 mL/hr at 09/12/20 0800, 75 mcg/hr at 09/12/20 0800 .  free water 200 mL, 200 mL, Per Tube, Q8H, ThoGeorganna SkeansD, 200 mL at 09/12/20 0518 .  gabapentin (NEURONTIN) 250 MG/5ML solution 600 mg, 600 mg, Per Tube, Q8H, ThoGeorganna SkeansD, 600 mg at 09/12/20 0512 .  guaiFENesin (ROBITUSSIN) 100 MG/5ML solution 300 mg, 15 mL, Per Tube, Q6H, ThoGeorganna Skeans  MD, 300 mg at 09/12/20 0512 .  ipratropium-albuterol (DUONEB) 0.5-2.5 (3) MG/3ML nebulizer solution 3 mL, 3 mL, Nebulization, Q6H PRN, Georganna Skeans, MD, 3 mL at 08/31/20 1707 .  ipratropium-albuterol (DUONEB) 0.5-2.5 (3) MG/3ML nebulizer solution 3 mL, 3 mL, Nebulization, Q6H, Georganna Skeans, MD, 3 mL at 09/12/20 0804 .  levothyroxine (SYNTHROID) tablet 75 mcg, 75 mcg, Per Tube, Q0600, Georganna Skeans, MD, 75 mcg at 09/12/20 0512 .  MEDLINE mouth rinse, 15 mL, Mouth Rinse, 10 times per day, Clovis Riley, MD, 15 mL at 09/12/20 [phone] .  methocarbamol (ROBAXIN) tablet 1,000 mg, 1,000 mg, Per Tube, Q8H, Jesusita Oka, MD, 1,000 mg at 09/12/20 0149 .  morphine 4 MG/ML injection 4  mg, 4 mg, Intravenous, Q2H PRN, Georganna Skeans, MD, 4 mg at 08/31/20 2007 .  ondansetron (ZOFRAN-ODT) disintegrating tablet 4 mg, 4 mg, Oral, Q6H PRN **OR** ondansetron (ZOFRAN) injection 4 mg, 4 mg, Intravenous, Q6H PRN, Georganna Skeans, MD .  oxyCODONE (Oxy IR/ROXICODONE) immediate release tablet 5-10 mg, 5-10 mg, Per Tube, Q4H PRN, Jesusita Oka, MD, 10 mg at 09/10/20 0935 .  pantoprazole sodium (PROTONIX) 40 mg/20 mL oral suspension 40 mg, 40 mg, Per Tube, Daily, Georganna Skeans, MD, 40 mg at 09/11/20 1057 .  polyethylene glycol (MIRALAX / GLYCOLAX) packet 17 g, 17 g, Per Tube, Daily, Georganna Skeans, MD .  propofol (DIPRIVAN) 1000 MG/100ML infusion, 0-50 mcg/kg/min, Intravenous, Continuous, Romana Juniper A, MD, Last Rate: 4.63 mL/hr at 09/12/20 0800, 10 mcg/kg/min at 09/12/20 0800 .  sodium chloride flush (NS) 0.9 % injection 10-40 mL, 10-40 mL, Intracatheter, Q12H, Georganna Skeans, MD, 10 mL at 09/11/20 2221 .  sodium chloride flush (NS) 0.9 % injection 10-40 mL, 10-40 mL, Intracatheter, PRN, Georganna Skeans, MD .  traMADol Veatrice Bourbon) tablet 50 mg, 50 mg, Per Tube, Q6H, Georganna Skeans, MD, 50 mg at 09/12/20 2924   Physical Exam: Intubated, eyes open spontaneously,  motor 0/5 x4  Anterior Incision: c/d/i Posterior incision c/d/i no drainage, c/d/i  Assessment & Plan: 54 y.o. man s/p [facility] w/ severe hyperextension injury s/p C4-5 ACDF and C3-T2 posterior instrumented fusion, post-op still C5 ASIA A. Post-op with some CSF leak from posterior incision, 10/14 started diamox, 10/16 leak stopped  -elevated HOB as tolerated -call with any questions / concerns regarding repeat wound drainage  Judith Part, MD 09/12/20 8:50 AM

## 2020-09-12 NOTE — Progress Notes (Signed)
Patient ID: Aaron [name], male   DOB: 1966-06-26, 54 y.o.   MRN: [id number] Follow up - Trauma Critical Care  Patient Details:    Aaron [name] is an 54 y.o. male. Consults: Treatment Team:  Jadene Pierinistergard, Thomas A, MD   Subjective:    Overnight Issues: none.    Objective:  Vital signs for last 24 hours: Temp:  [97.5 F (36.4 C)-98.1 F (36.7 C)] 97.5 F (36.4 C) (10/23 0800) Pulse Rate:  [45-54] 51 (10/23 1000) Resp:  [16-23] 21 (10/23 1000) BP: (98-123)/(50-72) 118/62 (10/23 1000) SpO2:  [88 %-99 %] 95 % (10/23 1000) FiO2 (%):  [60 %-100 %] 80 % (10/23 0804) Weight:  [97.5 kg] 97.5 kg (10/23 0500)  Intake/Output from previous day: 10/22 0701 - 10/23 0700 In: 3865.8 [I.V.:500.8; NG/GT:3065; IV Piggyback:300.1] Out: 3050 [Urine:3050]  Intake/Output this shift: Total I/O In: 169.9 [I.V.:39.9; NG/GT:130] Out: 700 [Urine:700]  Vent settings for last 24 hours: Vent Mode: PRVC FiO2 (%):  [60 %-100 %] 80 % Set Rate:  [20 bmp] 20 bmp Vt Set:  [510 mL] 510 mL PEEP:  [10 cmH20] 10 cmH20 Plateau Pressure:  [23 cmH20-25 cmH20] 24 cmH20  Physical Exam:  General: on vent Neuro: quad, sleeping this AM.   HEENT/Neck: ETT and collar, PERRL Resp: coarse BS throughout.   CVS: bradycardic.  Regular.   GI: soft, NT, ND Extremities: edema 1+ and scrotal edema     Correction - NO COLLAR Results for orders placed or performed during the hospital encounter of 09/16/2020 (from the past 24 hour(s))  Glucose, capillary     Status: Abnormal   Collection Time: 09/11/20 11:49 AM  Result Value Ref Range   Glucose-Capillary 128 (H) 70 - 99 mg/dL  Glucose, capillary     Status: Abnormal   Collection Time: 09/11/20  3:59 PM  Result Value Ref Range   Glucose-Capillary 126 (H) 70 - 99 mg/dL  Glucose, capillary     Status: Abnormal   Collection Time: 09/11/20  7:41 PM  Result Value Ref Range   Glucose-Capillary 140 (H) 70 - 99 mg/dL  Glucose, capillary     Status: Abnormal   Collection  Time: 09/11/20 11:23 PM  Result Value Ref Range   Glucose-Capillary 146 (H) 70 - 99 mg/dL  Glucose, capillary     Status: Abnormal   Collection Time: 09/12/20  3:30 AM  Result Value Ref Range   Glucose-Capillary 125 (H) 70 - 99 mg/dL  Glucose, capillary     Status: Abnormal   Collection Time: 09/12/20  7:58 AM  Result Value Ref Range   Glucose-Capillary 129 (H) 70 - 99 mg/dL  Triglycerides     Status: None   Collection Time: 09/12/20  9:13 AM  Result Value Ref Range   Triglycerides 76 <150 mg/dL  CBC     Status: Abnormal   Collection Time: 09/12/20  9:13 AM  Result Value Ref Range   WBC 18.0 (H) 4.0 - 10.5 K/uL   RBC 2.56 (L) 4.22 - 5.81 MIL/uL   Hemoglobin 8.4 (L) 13.0 - 17.0 g/dL   HCT 10.227.2 (L) 39 - 52 %   MCV 106.3 (H) 80.0 - 100.0 fL   MCH 32.8 26.0 - 34.0 pg   MCHC 30.9 30.0 - 36.0 g/dL   RDW 72.519.0 (H) 36.611.5 - 44.015.5 %   Platelets 229 150 - 400 K/uL   nRBC 2.8 (H) 0.0 - 0.2 %  Basic metabolic panel     Status: Abnormal   Collection Time:  09/12/20  9:13 AM  Result Value Ref Range   Sodium 155 (H) 135 - 145 mmol/L   Potassium 4.6 3.5 - 5.1 mmol/L   Chloride 118 (H) 98 - 111 mmol/L   CO2 29 22 - 32 mmol/L   Glucose, Bld 115 (H) 70 - 99 mg/dL   BUN 61 (H) 6 - 20 mg/dL   Creatinine, Ser 9.39 0.61 - 1.24 mg/dL   Calcium 8.9 8.9 - 03.0 mg/dL   GFR, Estimated >09 >23 mL/min   Anion gap 8 5 - 15  Glucose, capillary     Status: Abnormal   Collection Time: 09/12/20 11:24 AM  Result Value Ref Range   Glucose-Capillary 132 (H) 70 - 99 mg/dL    Assessment & Plan: Present on Admission: . Quadriparesis (HCC)    LOS: 13 days   Additional comments:I reviewed the patient's new clinical lab test results. [name]   [facility]  C5 FX with quadriparesis- S/P C4-5 ACDF and C3-T2 PSIF 10/10 by Dr. Maurice Small. Diamox added for CSF leak and leak stopped. Acute hypoxic ventilator dependent respiratory failure-PEEP 10, back up to 80% with PNA, scheduled duonebs, should be able to go down on  FiO2.  CXR worse today in increased effusions, and increased basilar atelectasis or infiltrates.   Trace R PTX- no PTX on F/UCXR Pre-existing medical problems- restarted home valproate, neurontin, synthroid, seroquel, effexor. Holding VPA/ser/effexor per family request. Bradycardia - this is due to his cord injury, 40s, BP OK FEN- TF, bowel regimen.  Remains hypernatremic and hyperchloremic.   Foley - out, Q6 I/O ID - PNA clinically, start Maxipime empiric, repeat resp CX NGTD, but few GPCs and yeast.   ABL anemia- hgb stable VTE- PAS, LMWH Dispo- ICU, further goals of care discussion with family  Will need trach/peg, but vent settings still too high.    Critical Care Total Time*: 25 Minutes   Maudry Diego, MD FACS Surgical Oncology, General Surgery, Trauma and Critical [facility] Surgery, [place] [phone] for weekday/non holidays Check [email or web address] for coverage night/weekend/holidays  Do not use SecureChat as it is not reliable for timely patient care.     09/12/2020  *Care during the described time interval was provided by me. I have reviewed this patient's available data, including medical history, events of note, physical examination and test results as part of my evaluation.    Lines/tubes : Airway 8 mm (Active)  Secured at (cm) 25 cm 09/11/20 0752  Measured From Lips 09/11/20 0752  Secured Location Right 09/11/20 0752  Secured By [employer] 09/11/20 0752  Tube Holder Repositioned Yes 09/11/20 0752  Cuff Pressure (cm H2O) 23 cm H2O 09/10/20 2052  Site Condition Dry 09/11/20 0752     NG/OG Tube Orogastric Xray Measured external length of tube (Active)  External Length of Tube (cm) - (if applicable) 62 cm 09/09/20 2000  Site Assessment Clean;Dry;Intact 09/10/20 2000  Ongoing Placement Verification No change in cm markings or external length of tube from initial placement;No change in respiratory status;No acute changes, not attributed to  clinical condition 09/10/20 2000  Status Infusing tube feed 09/10/20 2000    Microbiology/Sepsis markers: Results for orders placed or performed during the hospital encounter of 09/15/2020  Respiratory Panel by RT PCR (Flu A&B, Covid) - Nasopharyngeal Swab     Status: None   Collection Time: 09/18/2020  1:42 AM   Specimen: Nasopharyngeal Swab  Result Value Ref Range Status   SARS Coronavirus 2 by RT PCR NEGATIVE NEGATIVE Final  Comment: (NOTE) SARS-CoV-2 target nucleic acids are NOT DETECTED.  The SARS-CoV-2 RNA is generally detectable in upper respiratoy specimens during the acute phase of infection. The lowest concentration of SARS-CoV-2 viral copies this assay can detect is 131 copies/mL. A negative result does not preclude SARS-Cov-2 infection and should not be used as the sole basis for treatment or other patient management decisions. A negative result may occur with  improper specimen collection/handling, submission of specimen other than nasopharyngeal swab, presence of viral mutation(s) within the areas targeted by this assay, and inadequate number of viral copies (<131 copies/mL). A negative result must be combined with clinical observations, patient history, and epidemiological information. The expected result is Negative.  Fact Sheet for Patients:  https://www.moore.com/  Fact Sheet for Healthcare Providers:  https://www.young.biz/  This test is no t yet approved or cleared by the [place] FDA and  has been authorized for detection and/or diagnosis of SARS-CoV-2 by FDA under an Emergency Use Authorization (EUA). This EUA will remain  in effect (meaning this test can be used) for the duration of the COVID-19 declaration under Section 564(b)(1) of the Act, 21 U.S.C. section 360bbb-3(b)(1), unless the authorization is terminated or revoked sooner.     Influenza A by PCR NEGATIVE NEGATIVE Final   Influenza B by PCR NEGATIVE  NEGATIVE Final    Comment: (NOTE) The Xpert Xpress SARS-CoV-2/FLU/RSV assay is intended as an aid in  the diagnosis of influenza from Nasopharyngeal swab specimens and  should not be used as a sole basis for treatment. Nasal washings and  aspirates are unacceptable for Xpert Xpress SARS-CoV-2/FLU/RSV  testing.  Fact Sheet for Patients: https://www.moore.com/  Fact Sheet for Healthcare Providers: https://www.young.biz/  This test is not yet approved or cleared by the [place] FDA and  has been authorized for detection and/or diagnosis of SARS-CoV-2 by  FDA under an Emergency Use Authorization (EUA). This EUA will remain  in effect (meaning this test can be used) for the duration of the  Covid-19 declaration under Section 564(b)(1) of the Act, 21  U.S.C. section 360bbb-3(b)(1), unless the authorization is  terminated or revoked. Performed at [facility], [address] [place], [place] [address]   MRSA PCR Screening     Status: None   Collection Time: 08/24/2020 11:48 PM   Specimen: Nasal Mucosa; Nasopharyngeal  Result Value Ref Range Status   MRSA by PCR NEGATIVE NEGATIVE Final    Comment:        The GeneXpert MRSA Assay (FDA approved for NASAL specimens only), is one component of a comprehensive MRSA colonization surveillance program. It is not intended to diagnose MRSA infection nor to guide or monitor treatment for MRSA infections. Performed at [facility], [address] [place], [place] [address]   Culture, respiratory (non-expectorated)     Status: None   Collection Time: 09/01/20  1:50 PM   Specimen: Tracheal Aspirate; Respiratory  Result Value Ref Range Status   Specimen Description TRACHEAL ASPIRATE  Final   Special Requests NONE  Final   Gram Stain   Final    FEW WBC PRESENT,BOTH PMN AND MONONUCLEAR RARE GRAM POSITIVE COCCI    Culture   Final    Normal respiratory flora-no Staph aureus or Pseudomonas  seen Performed at [facility], [address] [place], [place] [address]    Report Status 09/04/2020 FINAL  Final  Culture, respiratory (non-expectorated)     Status: None (Preliminary result)   Collection Time: 09/11/20  10:44 AM   Specimen: Tracheal Aspirate; Respiratory  Result Value Ref Range Status   Specimen Description TRACHEAL ASPIRATE  Final   Special Requests NONE  Final   Gram Stain   Final    FEW WBC PRESENT,BOTH PMN AND MONONUCLEAR FEW GRAM POSITIVE COCCI IN PAIRS RARE YEAST    Culture   Final    CULTURE REINCUBATED FOR BETTER GROWTH Performed at [facility], [address] [place], [place] [address]    Report Status PENDING  Incomplete    Anti-infectives:  Anti-infectives (From admission, onward)   Start     Dose/Rate Route Frequency Ordered Stop   09/11/20 1030  ceFEPIme (MAXIPIME) 2 g in sodium chloride 0.9 % 100 mL IVPB        2 g 200 mL/hr over 30 Minutes Intravenous Every 8 hours 09/11/20 0922     09/01/20 1000  ceFEPIme (MAXIPIME) 2 g in sodium chloride 0.9 % 100 mL IVPB  Status:  Discontinued        2 g 200 mL/hr over 30 Minutes Intravenous Every 8 hours 09/01/20 0906 09/04/20 1114

## 2020-09-13 ENCOUNTER — Inpatient Hospital Stay (HOSPITAL_COMMUNITY): Payer: Medicare Other

## 2020-09-13 LAB — BASIC METABOLIC PANEL
Anion gap: 8 (ref 5–15)
Anion gap: 9 (ref 5–15)
BUN: 59 mg/dL — ABNORMAL HIGH (ref 6–20)
BUN: 57 mg/dL — ABNORMAL HIGH (ref 6–20)
CO2: 30 mmol/L (ref 22–32)
CO2: 32 mmol/L (ref 22–32)
Calcium: 8.8 mg/dL — ABNORMAL LOW (ref 8.9–10.3)
Calcium: 9 mg/dL (ref 8.9–10.3)
Chloride: 119 mmol/L — ABNORMAL HIGH (ref 98–111)
Chloride: 119 mmol/L — ABNORMAL HIGH (ref 98–111)
Creatinine, Ser: 0.64 mg/dL (ref 0.61–1.24)
Creatinine, Ser: 0.67 mg/dL (ref 0.61–1.24)
GFR, Estimated: >60 mL/min (ref >60)
GFR, Estimated: >60 mL/min (ref >60)
Glucose, Bld: 134 mg/dL — ABNORMAL HIGH (ref 70–99)
Glucose, Bld: 103 mg/dL — ABNORMAL HIGH (ref 70–99)
Potassium: 5.5 mmol/L — ABNORMAL HIGH (ref 3.5–5.1)
Potassium: 4.6 mmol/L (ref 3.5–5.1)
Sodium: 157 mmol/L — ABNORMAL HIGH (ref 135–145)
Sodium: 160 mmol/L — ABNORMAL HIGH (ref 135–145)

## 2020-09-13 LAB — CULTURE, RESPIRATORY W GRAM STAIN: Culture: NORMAL

## 2020-09-13 LAB — CBC
HCT: 27.7 % — ABNORMAL LOW (ref 39.0–52.0)
Hemoglobin: 8.2 g/dL — ABNORMAL LOW (ref 13.0–17.0)
MCH: 31.5 pg (ref 26.0–34.0)
MCHC: 29.6 g/dL — ABNORMAL LOW (ref 30.0–36.0)
MCV: 106.5 fL — ABNORMAL HIGH (ref 80.0–100.0)
Platelets: 219 10*3/uL (ref 150–400)
RBC: 2.6 MIL/uL — ABNORMAL LOW (ref 4.22–5.81)
RDW: 19.3 % — ABNORMAL HIGH (ref 11.5–15.5)
WBC: 14.9 10*3/uL — ABNORMAL HIGH (ref 4.0–10.5)
nRBC: 4.1 % — ABNORMAL HIGH (ref 0.0–0.2)

## 2020-09-13 LAB — GLUCOSE, CAPILLARY
Glucose-Capillary: 115 mg/dL — ABNORMAL HIGH (ref 70–99)
Glucose-Capillary: 121 mg/dL — ABNORMAL HIGH (ref 70–99)
Glucose-Capillary: 122 mg/dL — ABNORMAL HIGH (ref 70–99)
Glucose-Capillary: 130 mg/dL — ABNORMAL HIGH (ref 70–99)
Glucose-Capillary: 92 mg/dL (ref 70–99)
Glucose-Capillary: 95 mg/dL (ref 70–99)

## 2020-09-13 MED ORDER — SODIUM CHLORIDE 0.9 % IV SOLN: INTRAVENOUS | Status: DC

## 2020-09-13 MED ORDER — FREE WATER
250.0000 mL | Freq: Four times a day (QID) | Status: DC
  Administered 2020-09-13 – 2020-09-15 (×9): 250 mL

## 2020-09-13 NOTE — Progress Notes (Signed)
Patient ID: Aaron [name], male   DOB: Nov 13, 1966, 54 y.o.   MRN: [id number] Follow up - Trauma Critical Care  Patient Details:    Aaron [name] is an 54 y.o. male. Consults: Treatment Team:  Jadene Pierini, MD   Subjective:    Overnight Issues: none.  Improved on ventilator and now on 40% FiO2. Able to communicate via nodding, eyes spontaneously open  Objective:  Vital signs for last 24 hours: Temp:  [97.6 F (36.4 C)-98 F (36.7 C)] 97.9 F (36.6 C) (10/24 0800) Pulse Rate:  [45-62] 62 (10/24 0900) Resp:  [15-23] 22 (10/24 0900) BP: (102-124)/(50-67) 124/59 (10/24 0900) SpO2:  [85 %-99 %] 91 % (10/24 0900) FiO2 (%):  [40 %-60 %] 40 % (10/24 0822)  Intake/Output from previous day: 10/23 0701 - 10/24 0700 In: 2751 [I.V.:422; NG/GT:2029; IV Piggyback:300] Out: 2675 [Urine:2675]  Intake/Output this shift: Total I/O In: 198.8 [I.V.:68.8; NG/GT:130] Out: 700 [Urine:700]  Vent settings for last 24 hours: Vent Mode: PRVC FiO2 (%):  [40 %-60 %] 40 % Set Rate:  [20 bmp] 20 bmp Vt Set:  [510 mL] 510 mL PEEP:  [10 cmH20] 10 cmH20 Plateau Pressure:  [22 cmH20-25 cmH20] 23 cmH20  Physical Exam:  General: on vent Neuro: quad, alert and interactive this AM.   HEENT/Neck: ETT and collar, PERRL Resp: coarse BS throughout.   CVS: bradycardic.  Regular.   GI: soft, NT, ND Extremities: edema 1+ and scrotal edema     Correction - NO COLLAR Results for orders placed or performed during the hospital encounter of 09/06/2020 (from the past 24 hour(s))  Glucose, capillary     Status: Abnormal   Collection Time: 09/12/20 11:24 AM  Result Value Ref Range   Glucose-Capillary 132 (H) 70 - 99 mg/dL  Glucose, capillary     Status: Abnormal   Collection Time: 09/12/20  4:12 PM  Result Value Ref Range   Glucose-Capillary 120 (H) 70 - 99 mg/dL  Glucose, capillary     Status: Abnormal   Collection Time: 09/12/20  7:44 PM  Result Value Ref Range   Glucose-Capillary 134 (H) 70 - 99  mg/dL  Glucose, capillary     Status: Abnormal   Collection Time: 09/12/20 11:33 PM  Result Value Ref Range   Glucose-Capillary 139 (H) 70 - 99 mg/dL  Glucose, capillary     Status: Abnormal   Collection Time: 09/13/20  3:28 AM  Result Value Ref Range   Glucose-Capillary 121 (H) 70 - 99 mg/dL  CBC     Status: Abnormal   Collection Time: 09/13/20  3:49 AM  Result Value Ref Range   WBC 14.9 (H) 4.0 - 10.5 K/uL   RBC 2.60 (L) 4.22 - 5.81 MIL/uL   Hemoglobin 8.2 (L) 13.0 - 17.0 g/dL   HCT 01.0 (L) 39 - 52 %   MCV 106.5 (H) 80.0 - 100.0 fL   MCH 31.5 26.0 - 34.0 pg   MCHC 29.6 (L) 30.0 - 36.0 g/dL   RDW 27.2 (H) 53.6 - 64.4 %   Platelets 219 150 - 400 K/uL   nRBC 4.1 (H) 0.0 - 0.2 %  Basic metabolic panel     Status: Abnormal   Collection Time: 09/13/20  3:49 AM  Result Value Ref Range   Sodium 157 (H) 135 - 145 mmol/L   Potassium 5.5 (H) 3.5 - 5.1 mmol/L   Chloride 119 (H) 98 - 111 mmol/L   CO2 30 22 - 32 mmol/L   Glucose, Bld  134 (H) 70 - 99 mg/dL   BUN 59 (H) 6 - 20 mg/dL   Creatinine, Ser 6.21 0.61 - 1.24 mg/dL   Calcium 8.8 (L) 8.9 - 10.3 mg/dL   GFR, Estimated >30 >86 mL/min   Anion gap 8 5 - 15  Glucose, capillary     Status: Abnormal   Collection Time: 09/13/20  7:47 AM  Result Value Ref Range   Glucose-Capillary 122 (H) 70 - 99 mg/dL    Assessment & Plan: Present on Admission: . Quadriparesis (HCC)    LOS: 14 days   Additional comments:I reviewed the patient's new clinical lab test results. [name]   [facility]  C5 FX with quadriparesis- S/P C4-5 ACDF and C3-T2 PSIF 10/10 by Dr. Maurice Small. Diamox added for CSF leak and leak stopped. Acute hypoxic ventilator dependent respiratory failure-PEEP 10, weaning FiO2 to 40% with PNA, scheduled duonebs, continue to wean vent as able.  CXR worse yesterday, pending repeat cxr.    Trace R PTX- no PTX on F/UCXR Pre-existing medical problems- restarted home valproate, neurontin, synthroid, seroquel, effexor. Holding  VPA/ser/effexor per family request. Bradycardia - this is due to his cord injury, 40s, BP OK FEN- TF, bowel regimen.  Remains hypernatremic and hyperchloremic.   Foley - out, Q6 I/O ID - PNA clinically, start Maxipime empiric, repeat resp CX NGTD, but few GPCs and yeast.   ABL anemia- hgb stable VTE- PAS, LMWH Hypernatremia - increased free water flushes to 250 q4 Dispo- ICU, further goals of care discussion with family  Will need trach/peg, when able per vent settings  Critical Care Total Time*: 54 Minutes  Aaron Salen, MD [facility] PGY 5  General Surgery'  Attending: Maudry Diego, MD FACS Surgical Oncology, General Surgery, Trauma and Critical [facility] Surgery, [place] [phone] for weekday/non holidays Check [email or web address] for coverage night/weekend/holidays  Do not use SecureChat as it is not reliable for timely patient care.     09/13/2020  *Care during the described time interval was provided by me. I have reviewed this patient's available data, including medical history, events of note, physical examination and test results as part of my evaluation.    Lines/tubes : Airway 8 mm (Active)  Secured at (cm) 25 cm 09/11/20 0752  Measured From Lips 09/11/20 0752  Secured Location Right 09/11/20 0752  Secured By [employer] 09/11/20 0752  Tube Holder Repositioned Yes 09/11/20 0752  Cuff Pressure (cm H2O) 23 cm H2O 09/10/20 2052  Site Condition Dry 09/11/20 0752     NG/OG Tube Orogastric Xray Measured external length of tube (Active)  External Length of Tube (cm) - (if applicable) 62 cm 09/09/20 2000  Site Assessment Clean;Dry;Intact 09/10/20 2000  Ongoing Placement Verification No change in cm markings or external length of tube from initial placement;No change in respiratory status;No acute changes, not attributed to clinical condition 09/10/20 2000  Status Infusing tube feed 09/10/20 2000    Microbiology/Sepsis markers: Results for  orders placed or performed during the hospital encounter of 09/11/2020  Respiratory Panel by RT PCR (Flu A&B, Covid) - Nasopharyngeal Swab     Status: None   Collection Time: 09/09/2020  1:42 AM   Specimen: Nasopharyngeal Swab  Result Value Ref Range Status   SARS Coronavirus 2 by RT PCR NEGATIVE NEGATIVE Final    Comment: (NOTE) SARS-CoV-2 target nucleic acids are NOT DETECTED.  The SARS-CoV-2 RNA is generally detectable in upper respiratoy specimens during the acute phase of infection. The lowest concentration of SARS-CoV-2 viral  copies this assay can detect is 131 copies/mL. A negative result does not preclude SARS-Cov-2 infection and should not be used as the sole basis for treatment or other patient management decisions. A negative result may occur with  improper specimen collection/handling, submission of specimen other than nasopharyngeal swab, presence of viral mutation(s) within the areas targeted by this assay, and inadequate number of viral copies (<131 copies/mL). A negative result must be combined with clinical observations, patient history, and epidemiological information. The expected result is Negative.  Fact Sheet for Patients:  https://www.moore.com/  Fact Sheet for Healthcare Providers:  https://www.young.biz/  This test is no t yet approved or cleared by the [place] FDA and  has been authorized for detection and/or diagnosis of SARS-CoV-2 by FDA under an Emergency Use Authorization (EUA). This EUA will remain  in effect (meaning this test can be used) for the duration of the COVID-19 declaration under Section 564(b)(1) of the Act, 21 U.S.C. section 360bbb-3(b)(1), unless the authorization is terminated or revoked sooner.     Influenza A by PCR NEGATIVE NEGATIVE Final   Influenza B by PCR NEGATIVE NEGATIVE Final    Comment: (NOTE) The Xpert Xpress SARS-CoV-2/FLU/RSV assay is intended as an aid in  the diagnosis of  influenza from Nasopharyngeal swab specimens and  should not be used as a sole basis for treatment. Nasal washings and  aspirates are unacceptable for Xpert Xpress SARS-CoV-2/FLU/RSV  testing.  Fact Sheet for Patients: https://www.moore.com/  Fact Sheet for Healthcare Providers: https://www.young.biz/  This test is not yet approved or cleared by the [place] FDA and  has been authorized for detection and/or diagnosis of SARS-CoV-2 by  FDA under an Emergency Use Authorization (EUA). This EUA will remain  in effect (meaning this test can be used) for the duration of the  Covid-19 declaration under Section 564(b)(1) of the Act, 21  U.S.C. section 360bbb-3(b)(1), unless the authorization is  terminated or revoked. Performed at [facility], [address], [place], [place] [address]   MRSA PCR Screening     Status: None   Collection Time: 09/08/2020 11:48 PM   Specimen: Nasal Mucosa; Nasopharyngeal  Result Value Ref Range Status   MRSA by PCR NEGATIVE NEGATIVE Final    Comment:        The GeneXpert MRSA Assay (FDA approved for NASAL specimens only), is one component of a comprehensive MRSA colonization surveillance program. It is not intended to diagnose MRSA infection nor to guide or monitor treatment for MRSA infections. Performed at [facility], [address], [place], [place] [address]   Culture, respiratory (non-expectorated)     Status: None   Collection Time: 09/01/20  1:50 PM   Specimen: Tracheal Aspirate; Respiratory  Result Value Ref Range Status   Specimen Description TRACHEAL ASPIRATE  Final   Special Requests NONE  Final   Gram Stain   Final    FEW WBC PRESENT,BOTH PMN AND MONONUCLEAR RARE GRAM POSITIVE COCCI    Culture   Final    Normal respiratory flora-no Staph aureus or Pseudomonas seen Performed at [facility], [address], [place], [place] [address]    Report Status 09/04/2020 FINAL   Final  Culture, respiratory (non-expectorated)     Status: None (Preliminary result)   Collection Time: 09/11/20 10:44 AM   Specimen: Tracheal Aspirate; Respiratory  Result Value Ref Range Status   Specimen Description TRACHEAL ASPIRATE  Final   Special Requests NONE  Final   Gram  Stain   Final    FEW WBC PRESENT,BOTH PMN AND MONONUCLEAR FEW GRAM POSITIVE COCCI IN PAIRS RARE YEAST    Culture   Final    CULTURE REINCUBATED FOR BETTER GROWTH Performed at [facility], [address] [place], [place] [address]    Report Status PENDING  Incomplete    Anti-infectives:  Anti-infectives (From admission, onward)   Start     Dose/Rate Route Frequency Ordered Stop   09/11/20 1030  ceFEPIme (MAXIPIME) 2 g in sodium chloride 0.9 % 100 mL IVPB        2 g 200 mL/hr over 30 Minutes Intravenous Every 8 hours 09/11/20 0922     09/01/20 1000  ceFEPIme (MAXIPIME) 2 g in sodium chloride 0.9 % 100 mL IVPB  Status:  Discontinued        2 g 200 mL/hr over 30 Minutes Intravenous Every 8 hours 09/01/20 0906 09/04/20 1114

## 2020-09-13 NOTE — Progress Notes (Signed)
Patient ID: Aaron [name], male   DOB: 19-May-1966, 54 y.o.   MRN: [id number] Eyes open and he regards me, no movement, dressing appears to be dry

## 2020-09-13 NOTE — Progress Notes (Signed)
Patient noted with minimal tube feed residual outside on corners of mouth. Also upon med administration, oral cavity noted to have liquid in it with slight bubbling sound noted when pushing meds, and hissing sound when air pushed.  ? Hole in [facility] Trauma resident aware, OG tube replaced.  Original OG tube noted with small hole in it.   Patient also required increase in FiO2 to 60% on vent to maintain sats. Resident aware. CXR done today. Spo2 currently 93% on 60% FiO2.

## 2020-09-13 NOTE — Progress Notes (Signed)
Assisted family with tele-visit via elink 

## 2020-09-14 LAB — BASIC METABOLIC PANEL
Anion gap: 7 (ref 5–15)
BUN: 53 mg/dL — ABNORMAL HIGH (ref 6–20)
CO2: 30 mmol/L (ref 22–32)
Calcium: 8.7 mg/dL — ABNORMAL LOW (ref 8.9–10.3)
Chloride: 121 mmol/L — ABNORMAL HIGH (ref 98–111)
Creatinine, Ser: 0.7 mg/dL (ref 0.61–1.24)
GFR, Estimated: >60 mL/min (ref >60)
Glucose, Bld: 151 mg/dL — ABNORMAL HIGH (ref 70–99)
Potassium: 4.4 mmol/L (ref 3.5–5.1)
Sodium: 158 mmol/L — ABNORMAL HIGH (ref 135–145)

## 2020-09-14 LAB — CBC
HCT: 26.8 % — ABNORMAL LOW (ref 39.0–52.0)
Hemoglobin: 8 g/dL — ABNORMAL LOW (ref 13.0–17.0)
MCH: 31.7 pg (ref 26.0–34.0)
MCHC: 29.9 g/dL — ABNORMAL LOW (ref 30.0–36.0)
MCV: 106.3 fL — ABNORMAL HIGH (ref 80.0–100.0)
Platelets: 192 10*3/uL (ref 150–400)
RBC: 2.52 MIL/uL — ABNORMAL LOW (ref 4.22–5.81)
RDW: 19.9 % — ABNORMAL HIGH (ref 11.5–15.5)
WBC: 15.8 10*3/uL — ABNORMAL HIGH (ref 4.0–10.5)
nRBC: 1.3 % — ABNORMAL HIGH (ref 0.0–0.2)

## 2020-09-14 LAB — GLUCOSE, CAPILLARY
Glucose-Capillary: 113 mg/dL — ABNORMAL HIGH (ref 70–99)
Glucose-Capillary: 115 mg/dL — ABNORMAL HIGH (ref 70–99)
Glucose-Capillary: 123 mg/dL — ABNORMAL HIGH (ref 70–99)
Glucose-Capillary: 144 mg/dL — ABNORMAL HIGH (ref 70–99)
Glucose-Capillary: 153 mg/dL — ABNORMAL HIGH (ref 70–99)
Glucose-Capillary: 163 mg/dL — ABNORMAL HIGH (ref 70–99)

## 2020-09-14 MED ORDER — CHLORHEXIDINE GLUCONATE CLOTH 2 % EX PADS
6.0000 | MEDICATED_PAD | Freq: Every day | CUTANEOUS | Status: DC
  Administered 2020-09-14 – 2020-09-20 (×6): 6 via TOPICAL

## 2020-09-14 NOTE — Progress Notes (Addendum)
Per daughter, Joash Tony, please contact her at [phone] or Ellan Lambert [phone] with updates.

## 2020-09-14 NOTE — Progress Notes (Signed)
Occupational Therapy Treatment Patient Details Name: Aaron [name] MRN: [id number] DOB: 10-13-1966 Today's Date: 09/14/2020    History of present illness This 54 y.o. male admitted after [facility] - no LOC, but unable to move Bil. UEs and LEs.  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  He underwent C4-5 ACDF, C3,C4,C5, T1,T2 laminectomies, C3-T2 PLIF.  Pt remained intubated post op.  Pt  PMH includes: Bipolar disorder, HTN; s/p ACDF C5-C7   OT comments  Pt awake/alert throughout session today, nodding head in response to questions and mostly seems appropriate (when given multiple choice pt correctly shaking head in response to current location). Pt tolerating bed egress to chair position with BP remaining stable throughout. Pt with notable swelling throughout extremities and in scrotum and will likely benefit from scrotal sling as well as prevalon/PRAFO boots - spoke with RN about these as well. Pt remains totalA for ADL. Will continue per POC.   Follow Up Recommendations  CIR;LTACH (vs)    Equipment Recommendations  Other (comment);Hospital bed;Wheelchair (measurements OT);Wheelchair cushion (measurements OT)          Precautions / Restrictions Precautions Precautions: Other (comment);Back;Cervical Precaution Comments: ETT, swollen scrotum, watch 02 Restrictions Weight Bearing Restrictions: No       Mobility Bed Mobility Overal bed mobility: Needs Assistance Bed Mobility: Rolling Rolling: Total assist;+2 for physical assistance         General bed mobility comments: Placed pt in Egress position in bed to determine effects of upright positioning. see BP's.  Pt relays non verbally no ill effects of upright sitting. rolling to L/R for pericare start of session  Transfers                      Balance Overall balance assessment: Needs assistance Sitting-balance support: Feet  unsupported Sitting balance-Leahy Scale: Zero Sitting balance - Comments: listed R with bed approx 65* in egress position                                   ADL either performed or assessed with clinical judgement   ADL Overall ADL's : Needs assistance/impaired                                       General ADL Comments: remains totalA     Vision       Perception     Praxis      Cognition Arousal/Alertness: Awake/alert Behavior During Therapy: Flat affect Overall Cognitive Status: Difficult to assess Area of Impairment: Following commands                   Current Attention Level: Focused;Sustained   Following Commands: Follows one step commands inconsistently;Follows one step commands with increased time       General Comments: pt most awake today compared to previous sessions         Exercises Exercises: Other exercises Other Exercises Other Exercises: PROM/stretching to all extremities, elevated end of sessino for edema management  Other Exercises: Stretching LE heel cords and into Hip IR as pt favors hip ER   Shoulder Instructions       General Comments BP sitting 109/54 (69), after 5 min  106/49 (64),  Noted some  torsional nystagmus with rolling to the right.    Pertinent Vitals/ Pain       Pain Assessment: Faces Faces Pain Scale: Hurts a little bit Pain Location: generalized Pain Descriptors / Indicators: Restless;Other (Comment) (biting at ETT) Pain Intervention(s): Monitored during session  Home Living                                          Prior Functioning/Environment              Frequency  Min 2X/week        Progress Toward Goals  OT Goals(current goals can now be found in the care plan section)  Progress towards OT goals: Progressing toward goals  Acute Rehab OT Goals Patient Stated Goal: unable to state OT Goal Formulation: Patient unable to participate in goal  setting Time For Goal Achievement: 09/16/20 Potential to Achieve Goals: Good ADL Goals Additional ADL Goal #1: Pt will tolerate upright posture/sitting x 10 mins with stable vitals in prep for ADLs/functional transfers Additional ADL Goal #2: Pt will consistently maintain arousal during therapy sessions Additional ADL Goal #3: family will be independent with PROM bil. UEs Additional ADL Goal #4: Will use soft touch call bell with Lt UE and mod A  Plan Discharge plan remains appropriate    Co-evaluation    PT/OT/SLP Co-Evaluation/Treatment: Yes Reason for Co-Treatment: Complexity of the patient's impairments (multi-system involvement);For patient/therapist safety;To address functional/ADL transfers PT goals addressed during session: Mobility/safety with mobility;Strengthening/ROM OT goals addressed during session: Strengthening/ROM      AM-PAC OT "6 Clicks" Daily Activity     Outcome Measure   Help from another person eating meals?: Total Help from another person taking care of personal grooming?: Total Help from another person toileting, which includes using toliet, bedpan, or urinal?: Total Help from another person bathing (including washing, rinsing, drying)?: Total Help from another person to put on and taking off regular upper body clothing?: Total Help from another person to put on and taking off regular lower body clothing?: Total 6 Click Score: 6    End of Session Equipment Utilized During Treatment: Oxygen  OT Visit Diagnosis: Muscle weakness (generalized) (M62.81)   Activity Tolerance Patient tolerated treatment well   Patient Left in bed;with call bell/phone within reach   Nurse Communication Mobility status        Time: 1701-1730 OT Time Calculation (min): 29 min  Charges: OT General Charges $OT Visit: 1 Visit OT Treatments $Self Care/Home Management : 8-22 mins  Marcy Siren, OT Acute Rehabilitation Services Pager [phone] Office  [phone]    [name] 09/14/2020, 6:00 PM

## 2020-09-14 NOTE — Progress Notes (Signed)
Physical Therapy Treatment Patient Details Name: Aaron [name] MRN: [id number] DOB: 10-Oct-1966 Today's Date: 09/14/2020    History of Present Illness This 54 y.o. male admitted after [facility] - no LOC, but unable to move Bil. UEs and LEs.  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  He underwent C4-5 ACDF, C3,C4,C5, T1,T2 laminectomies, C3-T2 PLIF.  Pt remained intubated post op.  Pt  PMH includes: Bipolar disorder, HTN; s/p ACDF C5-C7    PT Comments    Pt awake and alert today.  Answering simple questions about home, pain, how he feels etc.  Worrying/biting the ETT, setting off alarms.  Emphasis on rolling for peri-care and then sitting in egress position for tolerance.  BP's soft in sitting, but pt reported no ill effects.    Follow Up Recommendations  CIR     Equipment Recommendations  Wheelchair (measurements PT);Wheelchair cushion (measurements PT);Hospital bed    Recommendations for Other Services Rehab consult     Precautions / Restrictions Precautions Precautions: Other (comment);Back;Cervical Precaution Comments: ETT, swollen scrotum, watch 02 Restrictions Weight Bearing Restrictions: No    Mobility  Bed Mobility Overal bed mobility: Needs Assistance Bed Mobility: Rolling Rolling: Total assist;+2 for physical assistance         General bed mobility comments: Placed pt in Egress position in bed to determine effects of upright positioning. see BP's.  Pt relays non verbally no ill effects of upright sitting.  Transfers                    Ambulation/Gait                 Stairs             Wheelchair Mobility    Modified Rankin (Stroke Patients Only)       Balance Overall balance assessment: Needs assistance Sitting-balance support: Feet unsupported Sitting balance-Leahy Scale: Zero Sitting balance - Comments: listed R with bed approx 65* in egress  position                                    Cognition Arousal/Alertness: Lethargic Behavior During Therapy: Flat affect Overall Cognitive Status: Difficult to assess Area of Impairment: Following commands                   Current Attention Level: Focused;Sustained   Following Commands: Follows one step commands consistently              Exercises Other Exercises Other Exercises: Bil LE PROM, R hip IR emphasis, bil UE PROM, bil basic retrograde massage/positioning Other Exercises: Stretching LE heel cords and into Hip IR as pt favors hip ER    General Comments General comments (skin integrity, edema, etc.): BP sitting 109/54 (69), after 5 min  106/49 (64),  Noted some torsional nystagmus with rolling to the right.      Pertinent Vitals/Pain Pain Assessment: Faces Faces Pain Scale: Hurts a little bit Pain Location: generalized Pain Descriptors / Indicators: Restless;Other (Comment) (biting at ETT) Pain Intervention(s): Monitored during session    Home Living                      Prior Function            PT Goals (current goals can now be found  in the care plan section) Acute Rehab PT Goals Patient Stated Goal: pt unable to state, PT goal to improve mobility quality and reduce caregiver burden PT Goal Formulation: Patient unable to participate in goal setting Time For Goal Achievement: 09/16/20 Potential to Achieve Goals: Fair Progress towards PT goals: Progressing toward goals    Frequency    Min 3X/week      PT Plan Current plan remains appropriate    Co-evaluation PT/OT/SLP Co-Evaluation/Treatment: Yes Reason for Co-Treatment: Complexity of the patient's impairments (multi-system involvement);For patient/therapist safety;To address functional/ADL transfers PT goals addressed during session: Mobility/safety with mobility;Strengthening/ROM OT goals addressed during session: Strengthening/ROM      AM-PAC PT "6 Clicks"  Mobility   Outcome Measure  Help needed turning from your back to your side while in a flat bed without using bedrails?: Total Help needed moving from lying on your back to sitting on the side of a flat bed without using bedrails?: Total Help needed moving to and from a bed to a chair (including a wheelchair)?: Total Help needed standing up from a chair using your arms (e.g., wheelchair or bedside chair)?: Total Help needed to walk in hospital room?: Total Help needed climbing 3-5 steps with a railing? : Total 6 Click Score: 6    End of Session Equipment Utilized During Treatment: Oxygen Activity Tolerance: Patient tolerated treatment well Patient left: in bed;with call bell/phone within reach;with bed alarm set;with SCD's reapplied Nurse Communication: Mobility status;Need for lift equipment PT Visit Diagnosis: Other abnormalities of gait and mobility (R26.89);Muscle weakness (generalized) (M62.81);Other symptoms and signs involving the nervous system (R29.898)     Time: 6226-3335 PT Time Calculation (min) (ACUTE ONLY): 30 min  Charges:  $Therapeutic Activity: 8-22 mins                     09/14/2020  Jacinto Halim., PT Acute Rehabilitation Services [phone]  (pager) [phone]  (office)   Eliseo Gum Jamien Casanova 09/14/2020, 5:58 PM

## 2020-09-14 NOTE — Consult Note (Signed)
WOC Nurse Consult Note: Reason for Consult: Sacral wound. Patient consult received for this wound on 09/11/20 (Friday), patient seen by my associate, D. Sylvie Farrier. See her note dated 09/11/20.  reconsulted today for same wound.  Patient to be seen weekly in follow up, every 7-10 days.  WOC nursing team will follow, seeing patient every 7-10 days and will remain available to this patient, the nursing and medical teams.   Thanks, Ladona Mow, MSN, RN, GNP, [facility] [place]  Pager# [phone]

## 2020-09-14 NOTE — Progress Notes (Signed)
RT found pt to have audible cuff leak. Come to find out pt had tongued his ETT to 20 at the lip with low VT. RT replaced ETT holder and tube was placed on 25 @ lip with BL lung sounds along with good VT.

## 2020-09-14 NOTE — Progress Notes (Signed)
RN called RT stating that pt was desating and pt became tachypneic. RT instructed RN to place pt back in full support and 100% to help with oxygenation.

## 2020-09-14 NOTE — Progress Notes (Signed)
Neurosurgery Service Progress Note  Subjective: No acute events overnight. No reports of leakage/staining overnight or the weekend per nursing.  Objective: Vitals:   09/14/20 0326 09/14/20 0400 09/14/20 0500 09/14/20 0700  BP: 116/60   (!) 108/55  Pulse: (!) 56   (!) 50  Resp: 20   20  Temp:  98 F (36.7 C)    TempSrc:  Axillary    SpO2: 93%   97%  Weight:   97.5 kg   Height:       Temp (24hrs), Avg:98.9 F (37.2 C), Min:98 F (36.7 C), Max:99.6 F (37.6 C)  CBC Latest Ref Rng & Units 09/14/2020 09/13/2020 09/12/2020  WBC 4.0 - 10.5 K/uL 15.8(H) 14.9(H) 18.0(H)  Hemoglobin 13.0 - 17.0 g/dL 8.0(L) 8.2(L) 8.4(L)  Hematocrit 39 - 52 % 26.8(L) 27.7(L) 27.2(L)  Platelets 150 - 400 K/uL 192 219 229   BMP Latest Ref Rng & Units 09/14/2020 09/13/2020 09/13/2020  Glucose 70 - 99 mg/dL 151(H) 103(H) 134(H)  BUN 6 - 20 mg/dL 53(H) 57(H) 59(H)  Creatinine 0.61 - 1.24 mg/dL 0.70 0.67 0.64  Sodium 135 - 145 mmol/L 158(H) 160(H) 157(H)  Potassium 3.5 - 5.1 mmol/L 4.4 4.6 5.5(H)  Chloride 98 - 111 mmol/L 121(H) 119(H) 119(H)  CO2 22 - 32 mmol/L 30 32 30  Calcium 8.9 - 10.3 mg/dL 8.7(L) 9.0 8.8(L)    Intake/Output Summary (Last 24 hours) at 09/14/2020 0808 Last data filed at 09/14/2020 0544 Gross per 24 hour  Intake 2109.83 ml  Output 2700 ml  Net -590.17 ml    Current Facility-Administered Medications:  .  0.9 %  sodium chloride infusion, , Intravenous, PRN, Ralene Ok, MD, Paused at 09/13/20 1452 .  0.9 %  sodium chloride infusion, , Intravenous, Continuous, Commander, Sarah, MD, Last Rate: 75 mL/hr at 09/14/20 0500, Rate Verify at 09/14/20 0500 .  acetaminophen (TYLENOL) tablet 1,000 mg, 1,000 mg, Per Tube, Q6H, Jesusita Oka, MD, 1,000 mg at 09/14/20 0440 .  bisacodyl (DULCOLAX) suppository 10 mg, 10 mg, Rectal, Daily, Jesusita Oka, MD, 10 mg at 09/13/20 0944 .  ceFEPIme (MAXIPIME) 2 g in sodium chloride 0.9 % 100 mL IVPB, 2 g, Intravenous, Q8H, Georganna Skeans,  MD, Stopped at 09/14/20 [phone] .  chlorhexidine gluconate (MEDLINE KIT) (PERIDEX) 0.12 % solution 15 mL, 15 mL, Mouth Rinse, BID, Romana Juniper A, MD, 15 mL at 09/13/20 2059 .  Chlorhexidine Gluconate Cloth 2 % PADS 6 each, 6 each, Topical, Daily, Judith Part, MD, 6 each at 09/13/20 0100 .  docusate (COLACE) 50 MG/5ML liquid 100 mg, 100 mg, Per Tube, BID, Greer Pickerel, MD, 100 mg at 09/13/20 8099 .  enoxaparin (LOVENOX) injection 40 mg, 40 mg, Subcutaneous, Q12H, Jesusita Oka, MD, 40 mg at 09/13/20 2148 .  feeding supplement (PIVOT 1.5 CAL) liquid 1,000 mL, 1,000 mL, Per Tube, Continuous, Georganna Skeans, MD, Last Rate: 65 mL/hr at 09/14/20 0100, 1,000 mL at 09/14/20 0100 .  feeding supplement (PROSource TF) liquid 45 mL, 45 mL, Per Tube, Daily, Georganna Skeans, MD, 45 mL at 09/13/20 0922 .  fentaNYL (SUBLIMAZE) bolus via infusion 50 mcg, 50 mcg, Intravenous, Q15 min PRN, Clovis Riley, MD, 50 mcg at 09/12/20 0004 .  fentaNYL 2527mg in NS 256m(1014mml) infusion-PREMIX, 50-200 mcg/hr, Intravenous, Continuous, ConRomana Juniper MD, Last Rate: 12.5 mL/hr at 09/14/20 0500, 125 mcg/hr at 09/14/20 0500 .  free water 250 mL, 250 mL, Per Tube, Q6H, ComEliot FordD, 250 mL at 09/14/20 0544 .  gabapentin (NEURONTIN) 250 MG/5ML solution 600 mg, 600 mg, Per Tube, Q8H, Georganna Skeans, MD, 600 mg at 09/14/20 0450 .  guaiFENesin (ROBITUSSIN) 100 MG/5ML solution 300 mg, 15 mL, Per Tube, Q6H, Georganna Skeans, MD, 300 mg at 09/14/20 0441 .  ipratropium-albuterol (DUONEB) 0.5-2.5 (3) MG/3ML nebulizer solution 3 mL, 3 mL, Nebulization, Q6H PRN, Georganna Skeans, MD, 3 mL at 08/31/20 1707 .  ipratropium-albuterol (DUONEB) 0.5-2.5 (3) MG/3ML nebulizer solution 3 mL, 3 mL, Nebulization, Q6H, Georganna Skeans, MD, 3 mL at 09/14/20 0321 .  levothyroxine (SYNTHROID) tablet 75 mcg, 75 mcg, Per Tube, Q0600, Georganna Skeans, MD, 75 mcg at 09/14/20 0440 .  MEDLINE mouth rinse, 15 mL, Mouth Rinse, 10 times  per day, Romana Juniper A, MD, 15 mL at 09/14/20 0654 .  methocarbamol (ROBAXIN) tablet 1,000 mg, 1,000 mg, Per Tube, Q8H, Jesusita Oka, MD, 1,000 mg at 09/14/20 0224 .  morphine 4 MG/ML injection 4 mg, 4 mg, Intravenous, Q2H PRN, Georganna Skeans, MD, 4 mg at 08/31/20 2007 .  ondansetron (ZOFRAN-ODT) disintegrating tablet 4 mg, 4 mg, Oral, Q6H PRN **OR** ondansetron (ZOFRAN) injection 4 mg, 4 mg, Intravenous, Q6H PRN, Georganna Skeans, MD .  oxyCODONE (Oxy IR/ROXICODONE) immediate release tablet 5-10 mg, 5-10 mg, Per Tube, Q4H PRN, Jesusita Oka, MD, 10 mg at 09/10/20 0935 .  pantoprazole sodium (PROTONIX) 40 mg/20 mL oral suspension 40 mg, 40 mg, Per Tube, Daily, Georganna Skeans, MD, 40 mg at 09/13/20 2979 .  polyethylene glycol (MIRALAX / GLYCOLAX) packet 17 g, 17 g, Per Tube, Daily, Georganna Skeans, MD, 17 g at 09/13/20 8921 .  propofol (DIPRIVAN) 1000 MG/100ML infusion, 0-50 mcg/kg/min, Intravenous, Continuous, Clovis Riley, MD, Paused at 09/13/20 1416 .  sodium chloride flush (NS) 0.9 % injection 10-40 mL, 10-40 mL, Intracatheter, Q12H, Georganna Skeans, MD, 10 mL at 09/14/20 0000 .  sodium chloride flush (NS) 0.9 % injection 10-40 mL, 10-40 mL, Intracatheter, PRN, Georganna Skeans, MD .  traMADol Veatrice Bourbon) tablet 50 mg, 50 mg, Per Tube, Q6H, Georganna Skeans, MD, 50 mg at 09/14/20 0440   Physical Exam: Intubated, FC, interactive this morning, motor 0/5x4 Anterior Incision: c/d/i Posterior incision c/d/i no drainage  Assessment & Plan: 54 y.o. male s/p [facility] w/ severe hyperextension injury s/p C4-5 ACDF and C3-T2 posterior instrumented fusion, post-op still C5 ASIA A. Post-op with some CSF leak from posterior incision, 10/14 started diamox, 10/16 leak stopped  -elevate HOB as tolerated -please notify providers regarding wound drainage/call with any questions   Osie Cheeks, NP 09/14/20 8:08 AM

## 2020-09-14 NOTE — Progress Notes (Signed)
Trauma/Critical Care Follow Up Note  Subjective:    Overnight Issues:   Objective:  Vital signs for last 24 hours: Temp:  [98 F (36.7 C)-99.6 F (37.6 C)] 98.2 F (36.8 C) (10/25 0800) Pulse Rate:  [50-64] 52 (10/25 1100) Resp:  [15-25] 21 (10/25 1100) BP: (104-126)/(50-66) 105/51 (10/25 1100) SpO2:  [89 %-100 %] 97 % (10/25 1100) FiO2 (%):  [50 %-60 %] 50 % (10/25 0828) Weight:  [97.5 kg] 97.5 kg (10/25 0500)  Hemodynamic parameters for last 24 hours:    Intake/Output from previous day: 10/24 0701 - 10/25 0700 In: 2924 [I.V.:1214; NG/GT:1410; IV Piggyback:300] Out: 2700 [Urine:2700]  Intake/Output this shift: Total I/O In: 250 [NG/GT:250] Out: -   Vent settings for last 24 hours: Vent Mode: PSV;CPAP FiO2 (%):  [50 %-60 %] 50 % Set Rate:  [20 bmp] 20 bmp Vt Set:  [510 mL] 510 mL PEEP:  [10 cmH20] 10 cmH20 Pressure Support:  [10 cmH20] 10 cmH20 Plateau Pressure:  [19 cmH20-24 cmH20] 21 cmH20  Physical Exam:  Gen: comfortable, no distress Neuro: grossly non-focal, follows commands, able to shrig shoulders b/l HEENT: intubated Neck: supple CV: RRR Pulm: unlabored breathing, mechanically ventilated Abd: soft, nontender GU: I/O q6 Extr: wwp, no edema   Results for orders placed or performed during the hospital encounter of 08/23/2020 (from the past 24 hour(s))  Glucose, capillary     Status: None   Collection Time: 09/13/20 11:39 AM  Result Value Ref Range   Glucose-Capillary 92 70 - 99 mg/dL  Glucose, capillary     Status: Abnormal   Collection Time: 09/13/20  5:15 PM  Result Value Ref Range   Glucose-Capillary 130 (H) 70 - 99 mg/dL  Glucose, capillary     Status: Abnormal   Collection Time: 09/13/20  7:57 PM  Result Value Ref Range   Glucose-Capillary 115 (H) 70 - 99 mg/dL  Glucose, capillary     Status: None   Collection Time: 09/13/20 11:53 PM  Result Value Ref Range   Glucose-Capillary 95 70 - 99 mg/dL  Glucose, capillary     Status: Abnormal    Collection Time: 09/14/20  3:43 AM  Result Value Ref Range   Glucose-Capillary 113 (H) 70 - 99 mg/dL  CBC     Status: Abnormal   Collection Time: 09/14/20  4:51 AM  Result Value Ref Range   WBC 15.8 (H) 4.0 - 10.5 K/uL   RBC 2.52 (L) 4.22 - 5.81 MIL/uL   Hemoglobin 8.0 (L) 13.0 - 17.0 g/dL   HCT 26.8 (L) 39 - 52 %   MCV 106.3 (H) 80.0 - 100.0 fL   MCH 31.7 26.0 - 34.0 pg   MCHC 29.9 (L) 30.0 - 36.0 g/dL   RDW 19.9 (H) 11.5 - 15.5 %   Platelets 192 150 - 400 K/uL   nRBC 1.3 (H) 0.0 - 0.2 %  Basic metabolic panel     Status: Abnormal   Collection Time: 09/14/20  4:51 AM  Result Value Ref Range   Sodium 158 (H) 135 - 145 mmol/L   Potassium 4.4 3.5 - 5.1 mmol/L   Chloride 121 (H) 98 - 111 mmol/L   CO2 30 22 - 32 mmol/L   Glucose, Bld 151 (H) 70 - 99 mg/dL   BUN 53 (H) 6 - 20 mg/dL   Creatinine, Ser 0.70 0.61 - 1.24 mg/dL   Calcium 8.7 (L) 8.9 - 10.3 mg/dL   GFR, Estimated >60 >60 mL/min   Anion gap  7 5 - 15  Glucose, capillary     Status: Abnormal   Collection Time: 09/14/20  8:30 AM  Result Value Ref Range   Glucose-Capillary 115 (H) 70 - 99 mg/dL    Assessment & Plan: The plan of care was discussed with the bedside nurse for the day, who is in agreement with this plan and no additional concerns were raised.   Present on Admission: . Quadriparesis ([place])    LOS: 15 days   Additional comments:I reviewed the patient's new clinical lab test results.   and I reviewed the patients new imaging test results.    [facility]  C5 fx with quadriparesis- S/P C4-5 ACDF and C3-T2 PSIF 10/10 by Dr. Zada Finders. Diamox added for CSF leak and leak stopped. Acute hypoxic ventilator dependent respiratory failure-PEEP 10, weaning FiO2 to 40% with PNA, scheduled duonebs, continue to wean vent as able. Trace R PTX- no PTX on f/uCXR Pre-existing medical problems- restarted home valproate, neurontin, synthroid, seroquel, effexor. Holding VPA/ser/effexor per family request. Bradycardia - this is  due to his cord injury, 10s, BP OK FEN- TF, bowel regimen.  Remains hypernatremic and hyperchloremic.   Urinary retention -expected given quadriparesis, Q6 I/O ID - AF, WBC 15.8, PNA clinically, on empiric Maxipime empiric, repeat resp cx again with normal resp flora. Will admin 5d course and d/w family re: bronch with BAL.  ABL anemia- hgb stable VTE- PAS, LMWH Hypernatremia - increased free water flushes to 350 q4, d/c MIVF Dispo- ICU, further goals of care discussion with family  Clinical update provided to daughter via phone, pending decision regarding bronch after discussion with other siblings.   Critical Care Total Time: 40 minutes  Jesusita Oka, MD Trauma & General Surgery Please use [email or web address] to contact on call provider  09/14/2020  *Care during the described time interval was provided by me. I have reviewed this patient's available data, including medical history, events of note, physical examination and test results as part of my evaluation.

## 2020-09-15 ENCOUNTER — Inpatient Hospital Stay (HOSPITAL_COMMUNITY): Payer: Medicare Other

## 2020-09-15 DIAGNOSIS — G8250 Quadriplegia, unspecified: Secondary | ICD-10-CM

## 2020-09-15 DIAGNOSIS — Z66 Do not resuscitate: Secondary | ICD-10-CM

## 2020-09-15 DIAGNOSIS — S14109A Unspecified injury at unspecified level of cervical spinal cord, initial encounter: Secondary | ICD-10-CM

## 2020-09-15 DIAGNOSIS — Z515 Encounter for palliative care: Secondary | ICD-10-CM

## 2020-09-15 LAB — CBC
HCT: 27.3 % — ABNORMAL LOW (ref 39.0–52.0)
Hemoglobin: 7.7 g/dL — ABNORMAL LOW (ref 13.0–17.0)
MCH: 31.3 pg (ref 26.0–34.0)
MCHC: 28.2 g/dL — ABNORMAL LOW (ref 30.0–36.0)
MCV: 111 fL — ABNORMAL HIGH (ref 80.0–100.0)
Platelets: 194 10*3/uL (ref 150–400)
RBC: 2.46 MIL/uL — ABNORMAL LOW (ref 4.22–5.81)
RDW: 19.4 % — ABNORMAL HIGH (ref 11.5–15.5)
WBC: 12.7 10*3/uL — ABNORMAL HIGH (ref 4.0–10.5)
nRBC: 1 % — ABNORMAL HIGH (ref 0.0–0.2)

## 2020-09-15 LAB — GLUCOSE, CAPILLARY
Glucose-Capillary: 100 mg/dL — ABNORMAL HIGH (ref 70–99)
Glucose-Capillary: 133 mg/dL — ABNORMAL HIGH (ref 70–99)
Glucose-Capillary: 134 mg/dL — ABNORMAL HIGH (ref 70–99)
Glucose-Capillary: 158 mg/dL — ABNORMAL HIGH (ref 70–99)
Glucose-Capillary: 87 mg/dL (ref 70–99)
Glucose-Capillary: 93 mg/dL (ref 70–99)

## 2020-09-15 LAB — BASIC METABOLIC PANEL
Anion gap: 8 (ref 5–15)
BUN: 57 mg/dL — ABNORMAL HIGH (ref 6–20)
CO2: 31 mmol/L (ref 22–32)
Calcium: 8.6 mg/dL — ABNORMAL LOW (ref 8.9–10.3)
Chloride: 120 mmol/L — ABNORMAL HIGH (ref 98–111)
Creatinine, Ser: 0.73 mg/dL (ref 0.61–1.24)
GFR, Estimated: >60 mL/min (ref >60)
Glucose, Bld: 104 mg/dL — ABNORMAL HIGH (ref 70–99)
Potassium: 4.8 mmol/L (ref 3.5–5.1)
Sodium: 159 mmol/L — ABNORMAL HIGH (ref 135–145)

## 2020-09-15 MED ORDER — MIDAZOLAM HCL 2 MG/2ML IJ SOLN
10.0000 mg | Freq: Once | INTRAMUSCULAR | Status: AC
  Administered 2020-09-15: 10 mg via INTRAVENOUS
  Filled 2020-09-15: qty 10

## 2020-09-15 MED ORDER — NOREPINEPHRINE 4 MG/250ML-% IV SOLN
INTRAVENOUS | Status: AC
  Filled 2020-09-15: qty 250

## 2020-09-15 MED ORDER — FENTANYL CITRATE (PF) 100 MCG/2ML IJ SOLN
100.0000 ug | Freq: Once | INTRAMUSCULAR | Status: AC
  Administered 2020-09-15: 100 ug via INTRAVENOUS
  Filled 2020-09-15: qty 2

## 2020-09-15 MED ORDER — DEXTROSE 5 % IV SOLN: INTRAVENOUS | Status: DC

## 2020-09-15 MED ORDER — SODIUM CHLORIDE 0.9 % IV BOLUS
1000.0000 mL | Freq: Once | INTRAVENOUS | Status: AC
  Administered 2020-09-15: 1000 mL via INTRAVENOUS

## 2020-09-15 MED ORDER — MIDAZOLAM HCL 2 MG/2ML IJ SOLN
INTRAMUSCULAR | Status: AC
  Administered 2020-09-15: 2 mg
  Filled 2020-09-15: qty 8

## 2020-09-15 MED ORDER — MIDAZOLAM HCL 2 MG/2ML IJ SOLN
INTRAMUSCULAR | Status: AC
  Administered 2020-09-15: 2 mg
  Filled 2020-09-15: qty 2

## 2020-09-15 MED ORDER — FREE WATER
350.0000 mL | Status: DC
  Administered 2020-09-15 – 2020-09-18 (×14): 350 mL

## 2020-09-15 NOTE — Progress Notes (Signed)
RT assisted MD with bedside bronch, pt tolerated well.

## 2020-09-15 NOTE — Procedures (Signed)
   Procedure Note  Date: 09/15/2020  Procedure: bronchoscopy with bronchoalveolar lavage   Pre-op diagnosis: suspicion for pneumonia Post-op diagnosis: same  Indication and clinical history: 41M with recurrent fevers and suspicion for pneumonia  Surgeon: Jesusita Oka, MD  Anesthesia: MAC: 340mg fentanyl, 260mversed, 6024mropofol  Findings: voluminous secretions Specimen: BAL-bilateral lungs  EBL: 0cc  Description of procedure: The patient was positioned semi-recumbent. Time-out was performed verifying correct patient, procedure, and signature of informed consent. MAC induction was uneventful and the patient was confirmed to be on 100% FiO2. The bronchoscope was inserted into the endotracheal tube and into the airway. Bronchoscopy was performed bilaterally and evaluation of the right side of the airway revealed healthy mucosa and copious amount of thick tan secretions. Bronchoalveolar lavage was performed and the specimen sent for Gram stain and quantitative culture. Evaluation of the right side of the airway revealed healthy mucosa and moderate amount of thick tan secretions. Bronchoalveolar lavage was performed and the specimen sent for Gram stain and quantitative culture. The bronchoscope was removed from the airway and the endotracheal tube. The patient tolerated the procedure well. There were no complications. Post procedure chest x-ray was ordered.    AyeJesusita OkaD General and TraSlatonrgery
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
## 2020-09-15 NOTE — Progress Notes (Addendum)
Physical Therapy Treatment Patient Details Name: Barre Aydelott MRN: [id number] DOB: 1966-09-09 Today's Date: 09/15/2020    History of Present Illness This 54 y.o. male admitted after [facility] - no LOC, but unable to move Bil. UEs and LEs.  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  He underwent C4-5 ACDF, C3,C4,C5, T1,T2 laminectomies, C3-T2 PLIF.  Pt remained intubated post op.  Pt  PMH includes: Bipolar disorder, HTN; s/p ACDF C5-C7    PT Comments    Goals updated.  Co-session with OT to assess tolerance of fully upright posture with bed in full chair mode as well as assist in application of scrotal sling.  Pt remains very edematous in all 4 extremities, however, cognition and tolerance of upright posture improving.  Vent settings remain high PRVC FiO2 50% and PEEP 10.  Recommend bil thigh high TED hose and bil prevalon boots to bedside RN.  PT will continue to follow acutely for safe mobility progression.     Follow Up Recommendations  CIR     Equipment Recommendations  Wheelchair (measurements PT);Wheelchair cushion (measurements PT);Hospital bed;Other (comment) (hoyer lift)    Recommendations for Other Services       Precautions / Restrictions Precautions Precautions: Other (comment);Back;Cervical Precaution Booklet Issued: No Precaution Comments: ETT, swollen scrotum, watch 02, pt likes to bite ETT    Mobility  Bed Mobility                  Transfers                    Ambulation/Gait                 Stairs             Wheelchair Mobility    Modified Rankin (Stroke Patients Only)       Balance                                            Cognition Arousal/Alertness: Lethargic;Suspect due to medications   Overall Cognitive Status: Impaired/Different from baseline Area of Impairment: Orientation;Attention;Following commands                  Orientation Level: Disoriented to;Time Current Attention Level: Sustained   Following Commands: Follows one step commands consistently       General Comments: Pt doing better with command following, consistent head nodding to command today.  Lethargic, but needs a bit of sedative to tolerate ETT without biting during therapy session.       Exercises General Exercises - Lower Extremity Ankle Circles/Pumps: PROM;Both;10 reps Heel Slides: PROM;Both;10 reps Hip ABduction/ADduction: PROM;Both;10 reps Other Exercises Other Exercises: hip IR x 10 reps bil    General Comments General comments (skin integrity, edema, etc.): goal of session to fasten scrotal sling with OT and to see how pt tolerates upright posture as his vent settings are too high to be aggressive with EOB or OOB mobility he was positioned in fully upright chair mode with vitals monitored and lotion/ROM completed to all 4 extremities.  BPs soft, but stable with MAP in the 70s.  Vent settings PRVO FiO2 50% PEEP 10.  Pt underwent bronchoscopy this AM.       Pertinent Vitals/Pain Pain Assessment: Faces Faces Pain Scale: Hurts  even more Pain Location: posterior neck Pain Descriptors / Indicators: Grimacing;Guarding Pain Intervention(s): Limited activity within patient's tolerance;Monitored during session;Repositioned    Home Living                      Prior Function            PT Goals (current goals can now be found in the care plan section) Acute Rehab PT Goals PT Goal Formulation: Patient unable to participate in goal setting Time For Goal Achievement: 09/29/20 Potential to Achieve Goals: Fair Progress towards PT goals: Progressing toward goals    Frequency    Min 3X/week      PT Plan Current plan remains appropriate    Co-evaluation PT/OT/SLP Co-Evaluation/Treatment: Yes Reason for Co-Treatment: Complexity of the patient's impairments (multi-system involvement);For  patient/therapist safety;To address functional/ADL transfers PT goals addressed during session: Mobility/safety with mobility;Strengthening/ROM        AM-PAC PT "6 Clicks" Mobility   Outcome Measure  Help needed turning from your back to your side while in a flat bed without using bedrails?: Total Help needed moving from lying on your back to sitting on the side of a flat bed without using bedrails?: Total Help needed moving to and from a bed to a chair (including a wheelchair)?: Total Help needed standing up from a chair using your arms (e.g., wheelchair or bedside chair)?: Total Help needed to walk in hospital room?: Total Help needed climbing 3-5 steps with a railing? : Total 6 Click Score: 6    End of Session Equipment Utilized During Treatment: Oxygen Activity Tolerance: Patient limited by fatigue Patient left: in bed;with call bell/phone within reach Nurse Communication: Mobility status;Other (comment) (recommending TED hose and prevalon boots) PT Visit Diagnosis: Other abnormalities of gait and mobility (R26.89);Muscle weakness (generalized) (M62.81);Other symptoms and signs involving the nervous system (G28.366)     Time: 2947-6546 PT Time Calculation (min) (ACUTE ONLY): 36 min  Charges:  $Therapeutic Exercise: 8-22 mins                     Corinna Capra, PT, DPT  Acute Rehabilitation [phone] pager [phone]) [phone] office

## 2020-09-15 NOTE — Progress Notes (Signed)
Occupational Therapy Treatment Patient Details Name: Aaron [name] MRN: [id number] DOB: 08-17-1966 Today's Date: 09/15/2020    History of present illness This 54 y.o. male admitted after [facility] - no LOC, but unable to move Bil. UEs and LEs.  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  He underwent C4-5 ACDF, C3,C4,C5, T1,T2 laminectomies, C3-T2 PLIF.  Pt remained intubated post op.  Pt  PMH includes: Bipolar disorder, HTN; s/p ACDF C5-C7   OT comments  Pt tolerating bed egress to chair position today - monitoring BP with transitional changes with BP overall maintaining (small drop, MAP remained >70). Fabricated and applied scrotal sling during this session given pt with notable edema in this region with pt endorsing discomfort. RN aware and educated on wear/sling management. Pt generally awake and using head nods to communicate (overall apepars accurate). PROM applied to all extremities as well as retrograde massage to UEs given edemous throughout. Pt on vent (FiO2 50%, PEEP 10) with SpO2 stable. Will continue per POC at this time.    Follow Up Recommendations  CIR;LTACH    Equipment Recommendations  Other (comment);Hospital bed;Wheelchair (measurements OT);Wheelchair cushion (measurements OT)          Precautions / Restrictions Precautions Precautions: Other (comment);Back;Cervical Precaution Booklet Issued: No Precaution Comments: ETT, swollen scrotum, watch 02, pt likes to bite ETT Restrictions Weight Bearing Restrictions: No       Mobility Bed Mobility Overal bed mobility: Needs Assistance             General bed mobility comments: use of bed egress to chair position, pt tolerating well   Transfers                 General transfer comment: positioning and ROM only    Balance                                           ADL either performed or assessed with  clinical judgement   ADL Overall ADL's : Needs assistance/impaired                                       General ADL Comments: remains totalA                       Cognition Arousal/Alertness: Lethargic;Suspect due to medications   Overall Cognitive Status: Impaired/Different from baseline Area of Impairment: Orientation;Attention;Following commands                 Orientation Level: Disoriented to;Time Current Attention Level: Sustained   Following Commands: Follows one step commands consistently       General Comments: Pt doing better with command following, consistent head nodding to command today.  Lethargic, but needs a bit of sedative to tolerate ETT without biting during therapy session.         Exercises Exercises: Other exercises General Exercises - Lower Extremity Ankle Circles/Pumps: PROM;Both;10 reps Heel Slides: PROM;Both;10 reps Hip ABduction/ADduction: PROM;Both;10 reps Other Exercises Other Exercises: hip IR x 10 reps bil Other Exercises: PROM bil UEs Other Exercises: gentle retrograde massage bil UEs/hands   Shoulder Instructions       General Comments goal of session to fasten  scrotal sling with OT and to see how pt tolerates upright posture as his vent settings are too high to be aggressive with EOB or OOB mobility he was positioned in fully upright chair mode with vitals monitored and lotion/ROM completed to all 4 extremities.  BPs soft, but stable with MAP in the 70s.  Vent settings PRVO FiO2 50% PEEP 10.  Pt underwent bronchoscopy this AM.     Pertinent Vitals/ Pain       Pain Assessment: Faces Faces Pain Scale: Hurts even more Pain Location: posterior neck Pain Descriptors / Indicators: Grimacing;Guarding Pain Intervention(s): Limited activity within patient's tolerance;Monitored during session;Repositioned  Home Living                                          Prior Functioning/Environment               Frequency  Min 2X/week        Progress Toward Goals  OT Goals(current goals can now be found in the care plan section)  Progress towards OT goals: Progressing toward goals  Acute Rehab OT Goals Patient Stated Goal: unable to state OT Goal Formulation: Patient unable to participate in goal setting Time For Goal Achievement: 09/16/20 Potential to Achieve Goals: Good  Plan Discharge plan remains appropriate    Co-evaluation    PT/OT/SLP Co-Evaluation/Treatment: Yes Reason for Co-Treatment: Complexity of the patient's impairments (multi-system involvement);For patient/therapist safety;To address functional/ADL transfers PT goals addressed during session: Mobility/safety with mobility;Strengthening/ROM        AM-PAC OT "6 Clicks" Daily Activity     Outcome Measure   Help from another person eating meals?: Total Help from another person taking care of personal grooming?: Total Help from another person toileting, which includes using toliet, bedpan, or urinal?: Total Help from another person bathing (including washing, rinsing, drying)?: Total Help from another person to put on and taking off regular upper body clothing?: Total Help from another person to put on and taking off regular lower body clothing?: Total 6 Click Score: 6    End of Session Equipment Utilized During Treatment: Oxygen  OT Visit Diagnosis: Muscle weakness (generalized) (M62.81)   Activity Tolerance Patient tolerated treatment well   Patient Left in bed;with call bell/phone within reach   Nurse Communication Mobility status        Time: 1950-9326 OT Time Calculation (min): 33 min  Charges: OT General Charges $OT Visit: 1 Visit OT Treatments $Self Care/Home Management : 8-22 mins  Marcy Siren, OT Acute Rehabilitation Services Pager [phone] Office [phone]   [name] 09/15/2020, 5:47 PM

## 2020-09-15 NOTE — Progress Notes (Signed)
Pt desat to low 80s, high 70s after bath and biting on tube and moving with tongue.  Sedation increased and pt educated to not bite/move tube.  Suctioning not bringing saturation up.  RT notified.  ETT advanced 1 cm by RT original position and breathing treatment given.  FIO2 increased for now with goal to wean as able.  Will continue to monitor.

## 2020-09-15 NOTE — Progress Notes (Signed)
Trauma/Critical Care Follow Up Note  Subjective:    Overnight Issues:   Objective:  Vital signs for last 24 hours: Temp:  [97.3 F (36.3 C)-97.9 F (36.6 C)] 97.9 F (36.6 C) (10/26 1600) Pulse Rate:  [46-63] 48 (10/26 1630) Resp:  [5-24] 20 (10/26 1630) BP: (86-129)/(41-70) 109/46 (10/26 1630) SpO2:  [89 %-100 %] 94 % (10/26 1630) FiO2 (%):  [50 %-100 %] 50 % (10/26 1517) Weight:  [98 kg] 98 kg (10/26 0500)  Hemodynamic parameters for last 24 hours:    Intake/Output from previous day: 10/25 0701 - 10/26 0700 In: 3056.8 [I.V.:910.4; NG/GT:1800; IV Piggyback:346.4] Out: 1725 [Urine:1725]  Intake/Output this shift: Total I/O In: 2965.2 [I.V.:96.5; NG/GT:768.7; IV Piggyback:2100.1] Out: 525 [Urine:525]  Vent settings for last 24 hours: Vent Mode: PRVC FiO2 (%):  [50 %-100 %] 50 % Set Rate:  [20 bmp] 20 bmp Vt Set:  [510 mL] 510 mL PEEP:  [10 cmH20] 10 cmH20 Plateau Pressure:  [20 cmH20-25 cmH20] 21 cmH20  Physical Exam:  Gen: comfortable, no distress Neuro: interactive, follows commands HEENT: intubated Neck: supple CV: RRR Pulm: unlabored breathing, mechanically ventilated Abd: soft, nontender GU: clear, yellow urine Extr: wwp, no edema   Results for orders placed or performed during the hospital encounter of 08/22/2020 (from the past 24 hour(s))  Glucose, capillary     Status: Abnormal   Collection Time: 09/14/20  7:56 PM  Result Value Ref Range   Glucose-Capillary 123 (H) 70 - 99 mg/dL  Glucose, capillary     Status: Abnormal   Collection Time: 09/14/20 11:49 PM  Result Value Ref Range   Glucose-Capillary 153 (H) 70 - 99 mg/dL  Glucose, capillary     Status: Abnormal   Collection Time: 09/15/20  3:43 AM  Result Value Ref Range   Glucose-Capillary 100 (H) 70 - 99 mg/dL  CBC     Status: Abnormal   Collection Time: 09/15/20  7:18 AM  Result Value Ref Range   WBC 12.7 (H) 4.0 - 10.5 K/uL   RBC 2.46 (L) 4.22 - 5.81 MIL/uL   Hemoglobin 7.7 (L) 13.0 -  17.0 g/dL   HCT 27.3 (L) 39 - 52 %   MCV 111.0 (H) 80.0 - 100.0 fL   MCH 31.3 26.0 - 34.0 pg   MCHC 28.2 (L) 30.0 - 36.0 g/dL   RDW 19.4 (H) 11.5 - 15.5 %   Platelets 194 150 - 400 K/uL   nRBC 1.0 (H) 0.0 - 0.2 %  Basic metabolic panel     Status: Abnormal   Collection Time: 09/15/20  7:18 AM  Result Value Ref Range   Sodium 159 (H) 135 - 145 mmol/L   Potassium 4.8 3.5 - 5.1 mmol/L   Chloride 120 (H) 98 - 111 mmol/L   CO2 31 22 - 32 mmol/L   Glucose, Bld 104 (H) 70 - 99 mg/dL   BUN 57 (H) 6 - 20 mg/dL   Creatinine, Ser 0.73 0.61 - 1.24 mg/dL   Calcium 8.6 (L) 8.9 - 10.3 mg/dL   GFR, Estimated >60 >60 mL/min   Anion gap 8 5 - 15  Glucose, capillary     Status: None   Collection Time: 09/15/20  8:19 AM  Result Value Ref Range   Glucose-Capillary 87 70 - 99 mg/dL  Culture, bal-quantitative     Status: None (Preliminary result)   Collection Time: 09/15/20 10:31 AM   Specimen: Bronchoalveolar Lavage; Respiratory  Result Value Ref Range   Specimen Description BRONCHIAL  ALVEOLAR LAVAGE    Special Requests NONE    Gram Stain      ABUNDANT WBC PRESENT, PREDOMINANTLY PMN FEW SQUAMOUS EPITHELIAL CELLS PRESENT ABUNDANT GRAM NEGATIVE RODS FEW GRAM POSITIVE RODS RARE GRAM POSITIVE COCCI IN PAIRS Performed at [facility], [place] [address] [place], [place] [address]    Culture PENDING    Report Status PENDING   Culture, bal-quantitative     Status: None (Preliminary result)   Collection Time: 09/15/20 10:31 AM   Specimen: Bronchoalveolar Lavage; Respiratory  Result Value Ref Range   Specimen Description BRONCHIAL ALVEOLAR LAVAGE    Special Requests NONE    Gram Stain      FEW WBC PRESENT, PREDOMINANTLY PMN NO SQUAMOUS EPITHELIAL CELLS SEEN RARE GRAM VARIABLE ROD Performed at [facility], [place] [address] [place], [place] [address]    Culture PENDING    Report Status PENDING   Glucose, capillary     Status: None   Collection Time: 09/15/20 12:40 PM  Result Value  Ref Range   Glucose-Capillary 93 70 - 99 mg/dL  Glucose, capillary     Status: Abnormal   Collection Time: 09/15/20  3:49 PM  Result Value Ref Range   Glucose-Capillary 158 (H) 70 - 99 mg/dL    Assessment & Plan: The plan of care was discussed with the bedside nurse for the day, who is in agreement with this plan and no additional concerns were raised.   Present on Admission: . Quadriparesis ([place])    LOS: 16 days   Additional comments:I reviewed the patient's new clinical lab test results.   and I reviewed the patients new imaging test results.    [facility]  C5 fx with quadriparesis- S/P C4-5 ACDF and C3-T2 PSIF 10/10 by Dr. Zada Finders. Diamox added for CSF leak and leak stopped. Acute hypoxic ventilator dependent respiratory failure-PEEP 10,weaning FiO2 to 40% with PNA, scheduled duonebs,continue to wean vent as able. Trace R PTX- no PTX on f/uCXR Pre-existing medical problems- restarted home valproate, neurontin, synthroid, seroquel, effexor. Holding VPA/ser/effexor per family request. Bradycardia- this is due to his cord injury, 59s, BP OK FEN- TF, bowel regimen. Remains hypernatremic and hyperchloremic.  Urinary retention -expected given quadriparesis, Q6 I/O ID- AF, WBC 15.8, PNA clinically, on empiric Maxipime empiric, repeat resp cx again with normal resp flora. Bronch with BAL today. ABL anemia- hgb stable VTE- PAS, LMWH Hypernatremia - increased free water flushes to 350 q4, D5W Dispo- ICU  Critical Care Total Time: 35 minutes  Jesusita Oka, MD Trauma & General Surgery Please use [email or web address] to contact on call provider  09/15/2020  *Care during the described time interval was provided by me. I have reviewed this patient's available data, including medical history, events of note, physical examination and test results as part of my evaluation.
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
## 2020-09-16 DIAGNOSIS — Z515 Encounter for palliative care: Secondary | ICD-10-CM

## 2020-09-16 DIAGNOSIS — Z66 Do not resuscitate: Secondary | ICD-10-CM

## 2020-09-16 DIAGNOSIS — S14109A Unspecified injury at unspecified level of cervical spinal cord, initial encounter: Secondary | ICD-10-CM

## 2020-09-16 LAB — POCT I-STAT 7, (LYTES, BLD GAS, ICA,H+H)
Acid-Base Excess: 7 mmol/L — ABNORMAL HIGH (ref 0.0–2.0)
Bicarbonate: 32.6 mmol/L — ABNORMAL HIGH (ref 20.0–28.0)
Calcium, Ion: 1.27 mmol/L (ref 1.15–1.40)
HCT: 22 % — ABNORMAL LOW (ref 39.0–52.0)
Hemoglobin: 7.5 g/dL — ABNORMAL LOW (ref 13.0–17.0)
O2 Saturation: 99 %
Patient temperature: 98.3
Potassium: 4.4 mmol/L (ref 3.5–5.1)
Sodium: 155 mmol/L — ABNORMAL HIGH (ref 135–145)
TCO2: 34 mmol/L — ABNORMAL HIGH (ref 22–32)
pCO2 arterial: 54.2 mmHg — ABNORMAL HIGH (ref 32.0–48.0)
pH, Arterial: 7.386 (ref 7.350–7.450)
pO2, Arterial: 124 mmHg — ABNORMAL HIGH (ref 83.0–108.0)

## 2020-09-16 LAB — CBC
HCT: 26.5 % — ABNORMAL LOW (ref 39.0–52.0)
Hemoglobin: 7.5 g/dL — ABNORMAL LOW (ref 13.0–17.0)
MCH: 31 pg (ref 26.0–34.0)
MCHC: 28.3 g/dL — ABNORMAL LOW (ref 30.0–36.0)
MCV: 109.5 fL — ABNORMAL HIGH (ref 80.0–100.0)
Platelets: 186 10*3/uL (ref 150–400)
RBC: 2.42 MIL/uL — ABNORMAL LOW (ref 4.22–5.81)
RDW: 18.6 % — ABNORMAL HIGH (ref 11.5–15.5)
WBC: 9.5 10*3/uL (ref 4.0–10.5)
nRBC: 1.2 % — ABNORMAL HIGH (ref 0.0–0.2)

## 2020-09-16 LAB — BASIC METABOLIC PANEL
Anion gap: 14 (ref 5–15)
BUN: 55 mg/dL — ABNORMAL HIGH (ref 6–20)
CO2: 21 mmol/L — ABNORMAL LOW (ref 22–32)
Calcium: 8.2 mg/dL — ABNORMAL LOW (ref 8.9–10.3)
Chloride: 118 mmol/L — ABNORMAL HIGH (ref 98–111)
Creatinine, Ser: 0.58 mg/dL — ABNORMAL LOW (ref 0.61–1.24)
GFR, Estimated: >60 mL/min (ref >60)
Glucose, Bld: 132 mg/dL — ABNORMAL HIGH (ref 70–99)
Potassium: 4.5 mmol/L (ref 3.5–5.1)
Sodium: 153 mmol/L — ABNORMAL HIGH (ref 135–145)

## 2020-09-16 LAB — GLUCOSE, CAPILLARY
Glucose-Capillary: 118 mg/dL — ABNORMAL HIGH (ref 70–99)
Glucose-Capillary: 118 mg/dL — ABNORMAL HIGH (ref 70–99)
Glucose-Capillary: 120 mg/dL — ABNORMAL HIGH (ref 70–99)
Glucose-Capillary: 123 mg/dL — ABNORMAL HIGH (ref 70–99)
Glucose-Capillary: 123 mg/dL — ABNORMAL HIGH (ref 70–99)
Glucose-Capillary: 145 mg/dL — ABNORMAL HIGH (ref 70–99)

## 2020-09-16 MED ORDER — SODIUM CHLORIDE 0.9 % IV SOLN
2.0000 g | Freq: Three times a day (TID) | INTRAVENOUS | Status: DC
  Administered 2020-09-16 – 2020-09-17 (×4): 2 g via INTRAVENOUS
  Filled 2020-09-16 (×4): qty 2

## 2020-09-16 MED ORDER — COLLAGENASE 250 UNIT/GM EX OINT
TOPICAL_OINTMENT | Freq: Every day | CUTANEOUS | Status: DC
  Filled 2020-09-16 (×2): qty 30

## 2020-09-16 NOTE — Progress Notes (Signed)
Trauma/Critical Care Follow Up Note  Subjective:    Overnight Issues:   Objective:  Vital signs for last 24 hours: Temp:  [97.3 F (36.3 C)-98.3 F (36.8 C)] 98.3 F (36.8 C) (10/27 0400) Pulse Rate:  [43-63] 47 (10/27 0700) Resp:  [0-24] 19 (10/27 0700) BP: (86-129)/(41-70) 100/57 (10/27 0700) SpO2:  [84 %-100 %] 97 % (10/27 0700) FiO2 (%):  [50 %-100 %] 70 % (10/27 0600)  Hemodynamic parameters for last 24 hours:    Intake/Output from previous day: 10/26 0701 - 10/27 0700 In: 5852.7 [I.V.:1220.5; PO/EU:2353.6; IV Piggyback:2300] Out: 1575 [Urine:1575]  Intake/Output this shift: No intake/output data recorded.  Vent settings for last 24 hours: Vent Mode: PRVC FiO2 (%):  [50 %-100 %] 70 % Set Rate:  [20 bmp] 20 bmp Vt Set:  [510 mL] 510 mL PEEP:  [10 cmH20] 10 cmH20 Plateau Pressure:  [20 cmH20-22 cmH20] 21 cmH20  Physical Exam:  Gen: comfortable, no distress, reports pain  Neuro:  follows commands HEENT: intubated Neck: supple CV: RRR Pulm: unlabored breathing, mechanically ventilated Abd: soft, nontender GU: clear, yellow urine Extr: wwp, no edema   Results for orders placed or performed during the hospital encounter of 08/24/2020 (from the past 24 hour(s))  Glucose, capillary     Status: None   Collection Time: 09/15/20  8:19 AM  Result Value Ref Range   Glucose-Capillary 87 70 - 99 mg/dL  Culture, bal-quantitative     Status: None (Preliminary result)   Collection Time: 09/15/20 10:31 AM   Specimen: Bronchoalveolar Lavage; Respiratory  Result Value Ref Range   Specimen Description BRONCHIAL ALVEOLAR LAVAGE    Special Requests NONE    Gram Stain      ABUNDANT WBC PRESENT, PREDOMINANTLY PMN FEW SQUAMOUS EPITHELIAL CELLS PRESENT ABUNDANT GRAM NEGATIVE RODS FEW GRAM POSITIVE RODS RARE GRAM POSITIVE COCCI IN PAIRS Performed at [facility], [place] [address], [place], [place] [address]    Culture PENDING    Report Status PENDING   Culture,  bal-quantitative     Status: None (Preliminary result)   Collection Time: 09/15/20 10:31 AM   Specimen: Bronchoalveolar Lavage; Respiratory  Result Value Ref Range   Specimen Description BRONCHIAL ALVEOLAR LAVAGE    Special Requests NONE    Gram Stain      FEW WBC PRESENT, PREDOMINANTLY PMN NO SQUAMOUS EPITHELIAL CELLS SEEN RARE GRAM VARIABLE ROD Performed at [facility], [place] [address] [place],  [address]    Culture PENDING    Report Status PENDING   Glucose, capillary     Status: None   Collection Time: 09/15/20 12:40 PM  Result Value Ref Range   Glucose-Capillary 93 70 - 99 mg/dL  Glucose, capillary     Status: Abnormal   Collection Time: 09/15/20  3:49 PM  Result Value Ref Range   Glucose-Capillary 158 (H) 70 - 99 mg/dL  Glucose, capillary     Status: Abnormal   Collection Time: 09/15/20  7:57 PM  Result Value Ref Range   Glucose-Capillary 134 (H) 70 - 99 mg/dL  Glucose, capillary     Status: Abnormal   Collection Time: 09/15/20 11:36 PM  Result Value Ref Range   Glucose-Capillary 133 (H) 70 - 99 mg/dL  Glucose, capillary     Status: Abnormal   Collection Time: 09/16/20  3:40 AM  Result Value Ref Range   Glucose-Capillary 123 (H) 70 - 99 mg/dL    Assessment & Plan: The plan of care was discussed  with the bedside nurse for the day, who is in agreement with this plan and no additional concerns were raised.   Present on Admission: . Quadriparesis ([place])    LOS: 17 days   Additional comments:I reviewed the patient's new clinical lab test results.   and I reviewed the patients new imaging test results.    [facility]  C83fwith quadriparesis- S/P C4-5 ACDF and C3-T2 PSIF 10/10 by Dr. OZada Finders Diamox added for CSF leak and leak stopped. Acute hypoxic ventilator dependent respiratory failure-PEEP 12,weaning FiO2 as tolerated with likely PNA, scheduled duonebs,ABG today Trace R PTX- no PTX onf/uCXR Pre-existing medical problems- restarted home  valproate, neurontin, synthroid, seroquel, effexor. Holding VPA/ser/effexor per family request. Bradycardia- this is due to his cord injury, 471s BP OK FEN- TF, bowel regimen. Remains hypernatremic and hyperchloremic. Urinary retention-expected given quadriparesis, Q6 I/O ID-AF, WBC 15.8,PNA clinically,on empiricMaxipime, repeat respcx again with normal resp flora. Bronch with BAL 10/26, pending. ABL anemia- hgb stable VTE- PAS, LMWH Hypernatremia - increased free water flushes to350 q4 yest, D5W, BMP pending Dispo- ICU  Critical Care Total Time: 40 minutes  AJesusita Oka MD Trauma & General Surgery Please use [email or web address] to contact on call provider  09/16/2020  *Care during the described time interval was provided by me. I have reviewed this patient's available data, including medical history, events of note, physical examination and test results as part of my evaluation.

## 2020-09-16 NOTE — Consult Note (Addendum)
WOC Nurse wound follow up Patient receiving care in [facility] [address] [place] sent to [facility] to assess the wound for surgical debridement or if this plan of care should continue. Wound type: Sacral wound Measurement: 9 cm x 8 cm unstageable  Wound bed: 75% black eschar, 25% pink/red Drainage (amount, consistency, odor) sanguinous on foam dressing.  Periwound: intact Dressing procedure/placement/frequency:  Hydrotherapy to begin with M-Sa treatments Apply Santyl to sacral wound in a nickel thick layer. Cover with a saline moistened gauze, then dry gauze or ABD pad.  Change daily. BEDSIDE NURSE TO APPLY SANTYL AND CHANGE DRESSING ON Sunday  Monitor the wound area(s) for worsening of condition such as: Signs/symptoms of infection, increase in size, development of or worsening of odor, development of pain, or increased pain at the affected locations.   Notify the medical team if any of these develop.  Thank you for the consult. WOC nurse will follow weekly to reassess. Please re-consult the WOC team if needed.  Renaldo Reel Katrinka Blazing, MSN, RN, CMSRN, Angus Seller, [facility] Wound Treatment Associate Pager [phone]

## 2020-09-16 NOTE — Progress Notes (Signed)
RT increased pt's Vt to 600 (9cc) per Dr. Bedelia Person.

## 2020-09-17 ENCOUNTER — Inpatient Hospital Stay (HOSPITAL_COMMUNITY): Payer: Medicare Other

## 2020-09-17 LAB — GLUCOSE, CAPILLARY
Glucose-Capillary: 118 mg/dL — ABNORMAL HIGH (ref 70–99)
Glucose-Capillary: 119 mg/dL — ABNORMAL HIGH (ref 70–99)
Glucose-Capillary: 82 mg/dL (ref 70–99)
Glucose-Capillary: 132 mg/dL — ABNORMAL HIGH (ref 70–99)
Glucose-Capillary: 123 mg/dL — ABNORMAL HIGH (ref 70–99)
Glucose-Capillary: 122 mg/dL — ABNORMAL HIGH (ref 70–99)

## 2020-09-17 LAB — CULTURE, BAL-QUANTITATIVE W GRAM STAIN
Culture: 10000 — AB
Culture: >=100000 — AB

## 2020-09-17 LAB — BASIC METABOLIC PANEL
Anion gap: 8 (ref 5–15)
BUN: 55 mg/dL — ABNORMAL HIGH (ref 6–20)
CO2: 26 mmol/L (ref 22–32)
Calcium: 8.3 mg/dL — ABNORMAL LOW (ref 8.9–10.3)
Chloride: 116 mmol/L — ABNORMAL HIGH (ref 98–111)
Creatinine, Ser: 0.61 mg/dL (ref 0.61–1.24)
GFR, Estimated: >60 mL/min (ref >60)
Glucose, Bld: 120 mg/dL — ABNORMAL HIGH (ref 70–99)
Potassium: 5 mmol/L (ref 3.5–5.1)
Sodium: 150 mmol/L — ABNORMAL HIGH (ref 135–145)

## 2020-09-17 LAB — CBC
HCT: 22.9 % — ABNORMAL LOW (ref 39.0–52.0)
Hemoglobin: 6.6 g/dL — CL (ref 13.0–17.0)
MCH: 31.6 pg (ref 26.0–34.0)
MCHC: 28.8 g/dL — ABNORMAL LOW (ref 30.0–36.0)
MCV: 109.6 fL — ABNORMAL HIGH (ref 80.0–100.0)
Platelets: 154 10*3/uL (ref 150–400)
RBC: 2.09 MIL/uL — ABNORMAL LOW (ref 4.22–5.81)
RDW: 18.6 % — ABNORMAL HIGH (ref 11.5–15.5)
WBC: 8 10*3/uL (ref 4.0–10.5)
nRBC: 1.5 % — ABNORMAL HIGH (ref 0.0–0.2)

## 2020-09-17 LAB — MAGNESIUM: Magnesium: 2.8 mg/dL — ABNORMAL HIGH (ref 1.7–2.4)

## 2020-09-17 LAB — PREPARE RBC (CROSSMATCH)

## 2020-09-17 LAB — HEMOGLOBIN AND HEMATOCRIT, BLOOD
HCT: 28.6 % — ABNORMAL LOW (ref 39.0–52.0)
Hemoglobin: 8.5 g/dL — ABNORMAL LOW (ref 13.0–17.0)

## 2020-09-17 LAB — PHOSPHORUS: Phosphorus: 4.3 mg/dL (ref 2.5–4.6)

## 2020-09-17 MED ORDER — PIVOT 1.5 CAL PO LIQD
1000.0000 mL | ORAL | Status: DC
  Administered 2020-09-17 – 2020-09-20 (×4): 1000 mL
  Filled 2020-09-17 (×5): qty 1000

## 2020-09-17 MED ORDER — FUROSEMIDE 10 MG/ML IJ SOLN
20.0000 mg | Freq: Once | INTRAMUSCULAR | Status: AC
  Administered 2020-09-17: 20 mg via INTRAVENOUS
  Filled 2020-09-17: qty 2

## 2020-09-17 MED ORDER — CALCIUM GLUCONATE-NACL 1-0.675 GM/50ML-% IV SOLN
1.0000 g | Freq: Once | INTRAVENOUS | Status: AC
  Administered 2020-09-17: 1000 mg via INTRAVENOUS
  Filled 2020-09-17: qty 50

## 2020-09-17 MED ORDER — SODIUM CHLORIDE 0.9% IV SOLUTION: Freq: Once | INTRAVENOUS | Status: DC

## 2020-09-17 MED ORDER — JUVEN PO PACK
1.0000 | PACK | Freq: Two times a day (BID) | ORAL | Status: DC
  Administered 2020-09-17 – 2020-09-21 (×8): 1
  Filled 2020-09-17 (×9): qty 1
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
## 2020-09-17 NOTE — Progress Notes (Signed)
Physical Therapy Treatment Patient Details Name: Aaron [name] MRN: [id number] DOB: [birth year] Today's Date: 09/17/2020    History of Present Illness This 54 y.o. male admitted after [facility] - no LOC, but unable to move Bil. UEs and LEs.  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  He underwent C4-5 ACDF, C3,C4,C5, T1,T2 laminectomies, C3-T2 PLIF.  Pt remained intubated post op.  Pt  PMH includes: Bipolar disorder, HTN; s/p ACDF C5-C7    PT Comments    The pt was received in bed, soiled upon arrival of PT. The pt was able to tolerate bilateral rolling in bed in addition to PROM exercises at this time, but continues to require maxA of 2 to complete. The pt presents with increased alertness at this time, with significant improvements in engagement, and consistency in answering yes/no questions. The pt will continue to benefit from skilled PT to progress mobility and to initiate OOB mobility to recliner to work on activity tolerance.     Follow Up Recommendations  CIR     Equipment Recommendations  Wheelchair (measurements PT);Wheelchair cushion (measurements PT);Hospital bed;Other (comment) (hoyer lift)    Recommendations for Other Services       Precautions / Restrictions Precautions Precautions: Other (comment);Back;Cervical Precaution Booklet Issued: No Precaution Comments: ETT, swollen scrotum, watch 02, pt likes to bite ETT Required Braces or Orthoses:  (scrotal sling applied) Restrictions Weight Bearing Restrictions: No    Mobility  Bed Mobility Overal bed mobility: Needs Assistance Bed Mobility: Rolling Rolling: Total assist;+2 for physical assistance         General bed mobility comments: use of bed egress to chair position, pt tolerating well   Transfers                 General transfer comment: positioning and ROM only       Cognition Arousal/Alertness:  Awake/alert Behavior During Therapy: Flat affect Overall Cognitive Status: Impaired/Different from baseline Area of Impairment: Attention;Following commands                   Current Attention Level: Sustained   Following Commands: Follows one step commands consistently       General Comments: pt answering simple yes/no questions with eye movement and small head nods consistently. alert through session      Exercises General Exercises - Lower Extremity Ankle Circles/Pumps: PROM;Both;10 reps;Supine Heel Slides: PROM;Both;10 reps;Supine Hip ABduction/ADduction: PROM;Both;10 reps;Supine    General Comments General comments (skin integrity, edema, etc.): pt soiled upon arrival of PT, SpO2 dropped to mid 80s with rolling for pericare/cleaning. Vent settings: 60%; PEEP of 12, and RR of 20      Pertinent Vitals/Pain Pain Assessment: Faces Faces Pain Scale: Hurts little more Pain Location: posterior neck Pain Descriptors / Indicators: Grimacing;Guarding Pain Intervention(s): Limited activity within patient's tolerance;Monitored during session           PT Goals (current goals can now be found in the care plan section) Acute Rehab PT Goals Patient Stated Goal: unable to state PT Goal Formulation: Patient unable to participate in goal setting Time For Goal Achievement: 09/29/20 Potential to Achieve Goals: Fair Progress towards PT goals: Progressing toward goals    Frequency    Min 3X/week      PT Plan Current plan remains appropriate    M-PAC PT "6 Clicks" Mobility   Outcome Measure  Help needed turning from your back to  your side while in a flat bed without using bedrails?: Total Help needed moving from lying on your back to sitting on the side of a flat bed without using bedrails?: Total Help needed moving to and from a bed to a chair (including a wheelchair)?: Total Help needed standing up from a chair using your arms (e.g., wheelchair or bedside chair)?:  Total Help needed to walk in hospital room?: Total Help needed climbing 3-5 steps with a railing? : Total 6 Click Score: 6    End of Session Equipment Utilized During Treatment: Oxygen (ETT) Activity Tolerance: Patient limited by fatigue Patient left: in bed;with call bell/phone within reach Nurse Communication: Mobility status PT Visit Diagnosis: Other abnormalities of gait and mobility (R26.89);Muscle weakness (generalized) (M62.81);Other symptoms and signs involving the nervous system (X83.338)     Time: 3291-9166 PT Time Calculation (min) (ACUTE ONLY): 34 min  Charges:  $Therapeutic Activity: 23-37 mins                    Rolm Baptise, PT, DPT   Acute Rehabilitation Department Pager #: [phone]   Gaetana Michaelis 09/17/2020, 3:11 PM

## 2020-09-17 NOTE — Progress Notes (Signed)
CRITICAL VALUE ALERT  Critical Value: Hemoglobin 6.6  Date & Time Notied:  09/17/2020 at 0113  Provider Notified: Dr. Freida Busman  Orders Received/Actions taken: Reviewed blood pressure and heart rate with Dr. Freida Busman. No new orders at this time.

## 2020-09-17 NOTE — Progress Notes (Signed)
Physical Therapy Wound Treatment Patient Details  Name: Aaron [name] MRN: [id number] Date of Birth: 10/25/1966  Today's Date: 09/17/2020 Time: 1100-1130 Time Calculation (min): 30 min  Subjective  Subjective: Alert on the vent Patient and Family Stated Goals: None stated Prior Treatments: Dressing changes  Pain Score:  Pt nodded head "yes" when asked if he was in pain prior to session. Pt was premedicated.   Wound Assessment  Pressure Injury 09/07/20 Sacrum Bilateral Deep Tissue Pressure Injury - Purple or maroon localized area of discolored intact skin or blood-filled blister due to damage of underlying soft tissue from pressure and/or shear. (Active)  Wound Image   09/17/20 1454  Dressing Type ABD;Barrier Film (skin prep);Gauze (Comment);Moist to dry 09/17/20 1454  Dressing Changed;Clean;Dry;Intact 09/17/20 1454  Dressing Change Frequency Daily 09/17/20 1454  State of Healing Eschar 09/17/20 1454  Site / Wound Assessment Black 09/17/20 1454  % Wound base Red or Granulating 0% 09/17/20 1454  % Wound base Yellow/Fibrinous Exudate 0% 09/17/20 1454  % Wound base Black/Eschar 100% 09/17/20 1454  Peri-wound Assessment Pink 09/17/20 1454  Wound Length (cm) 7 cm 09/17/20 1454  Wound Width (cm) 5 cm 09/17/20 1454  Wound Depth (cm) 0.1 cm 09/17/20 1454  Wound Surface Area (cm^2) 35 cm^2 09/17/20 1454  Wound Volume (cm^3) 3.5 cm^3 09/17/20 1454  Margins Unattached edges (unapproximated) 09/17/20 1454  Drainage Amount Minimal 09/17/20 1454  Drainage Description Serosanguineous 09/17/20 1454  Treatment Debridement (Selective);Hydrotherapy (Pulse lavage);Packing (Saline gauze) 09/17/20 1454   Santyl applied to wound bed prior to applying dressing.     Hydrotherapy Pulsed lavage therapy - wound location: Sacrum Pulsed Lavage with Suction (psi): 12 psi Pulsed Lavage with Suction - Normal Saline Used: 1000 mL Pulsed Lavage Tip: Tip with splash shield Selective Debridement Selective  Debridement - Location: Sacrum Selective Debridement - Tools Used: Forceps;Scalpel Selective Debridement - Tissue Removed: Eschar   Wound Assessment and Plan  Wound Therapy - Assess/Plan/Recommendations Wound Therapy - Clinical Statement: Pt presents to hydrotherapy with an unstagable sacral pressure injury. Wound was cross-hatched after pulsed-lavage and dressing replaced. O2 sats in low 80's for most of the session so debridement was minimized to get pt rolled back to supine and HOB elevated. This patient will benefit from continued hydrotherapy for selective removal of unviable tissue, to decrease bioburden and promote wound bed healing.  Wound Therapy - Functional Problem List: Decreased mobility on vent Factors Delaying/Impairing Wound Healing: Multiple medical problems;Immobility;Polypharmacy Hydrotherapy Plan: Debridement;Dressing change;Patient/family education;Pulsatile lavage with suction Wound Therapy - Frequency: 6X / week Wound Therapy - Follow Up Recommendations: Other (comment) (Inpatient rehab) Wound Plan: See above  Wound Therapy Goals- Improve the function of patient's integumentary system by progressing the wound(s) through the phases of wound healing (inflammation - proliferation - remodeling) by: Decrease Necrotic Tissue to: 20% Decrease Necrotic Tissue - Progress: Goal set today Increase Granulation Tissue to: 80% Increase Granulation Tissue - Progress: Goal set today Goals/treatment plan/discharge plan were made with and agreed upon by patient/family: No, Patient unable to participate in goals/treatment/discharge plan and family unavailable Time For Goal Achievement: 7 days Wound Therapy - Potential for Goals: Good  Goals will be updated until maximal potential achieved or discharge criteria met.  Discharge criteria: when goals achieved, discharge from hospital, MD decision/surgical intervention, no progress towards goals, refusal/missing three consecutive treatments  without notification or medical reason.  GP     Thelma Comp 09/17/2020, 3:11 PM   Rolinda Roan, PT, DPT Acute Rehabilitation Services Pager: [phone] Office: [phone]

## 2020-09-17 NOTE — Progress Notes (Signed)
Trauma/Critical Care Follow Up Note  Subjective:    Overnight Issues:   Objective:  Vital signs for last 24 hours: Temp:  [97.7 F (36.5 C)-98.3 F (36.8 C)] 97.8 F (36.6 C) (10/28 1200) Pulse Rate:  [47-57] 53 (10/28 1148) Resp:  [0-21] 20 (10/28 1148) BP: (99-118)/(53-69) 113/57 (10/28 1148) SpO2:  [91 %-98 %] 95 % (10/28 1148) FiO2 (%):  [50 %-60 %] 50 % (10/28 1148)  Hemodynamic parameters for last 24 hours:    Intake/Output from previous day: 10/27 0701 - 10/28 0700 In: 5511.7 [I.V.:1930.9; Blood:315; NG/GT:2965.8; IV Piggyback:300.1] Out: 8307 [Urine:1565]  Intake/Output this shift: Total I/O In: 427.1 [I.V.:85.1; Blood:342] Out: -   Vent settings for last 24 hours: Vent Mode: PRVC FiO2 (%):  [50 %-60 %] 50 % Set Rate:  [20 bmp] 20 bmp Vt Set:  [600 mL] 600 mL PEEP:  [10 OGA02-98 cmH20] 10 cmH20 Plateau Pressure:  [25 cmH20-30 cmH20] 26 cmH20  Physical Exam:  Gen: comfortable, no distress Neuro: grossly non-focal, follows commands, interactive HEENT: intubated Neck: supple CV: RRR Pulm: unlabored breathing, mechanically ventilated Abd: soft, nontender GU: clear, yellow urine Extr: wwp, no edema   Results for orders placed or performed during the hospital encounter of 09/06/2020 (from the past 24 hour(s))  Glucose, capillary     Status: Abnormal   Collection Time: 09/16/20 12:27 PM  Result Value Ref Range   Glucose-Capillary 123 (H) 70 - 99 mg/dL  Glucose, capillary     Status: Abnormal   Collection Time: 09/16/20  4:09 PM  Result Value Ref Range   Glucose-Capillary 118 (H) 70 - 99 mg/dL  Glucose, capillary     Status: Abnormal   Collection Time: 09/16/20  7:49 PM  Result Value Ref Range   Glucose-Capillary 118 (H) 70 - 99 mg/dL  Glucose, capillary     Status: Abnormal   Collection Time: 09/16/20 11:43 PM  Result Value Ref Range   Glucose-Capillary 120 (H) 70 - 99 mg/dL  CBC     Status: Abnormal   Collection Time: 09/17/20 12:39 AM  Result  Value Ref Range   WBC 8.0 4.0 - 10.5 K/uL   RBC 2.09 (L) 4.22 - 5.81 MIL/uL   Hemoglobin 6.6 (LL) 13.0 - 17.0 g/dL   HCT 22.9 (L) 39 - 52 %   MCV 109.6 (H) 80.0 - 100.0 fL   MCH 31.6 26.0 - 34.0 pg   MCHC 28.8 (L) 30.0 - 36.0 g/dL   RDW 18.6 (H) 11.5 - 15.5 %   Platelets 154 150 - 400 K/uL   nRBC 1.5 (H) 0.0 - 0.2 %  Basic metabolic panel     Status: Abnormal   Collection Time: 09/17/20 12:39 AM  Result Value Ref Range   Sodium 150 (H) 135 - 145 mmol/L   Potassium 5.0 3.5 - 5.1 mmol/L   Chloride 116 (H) 98 - 111 mmol/L   CO2 26 22 - 32 mmol/L   Glucose, Bld 120 (H) 70 - 99 mg/dL   BUN 55 (H) 6 - 20 mg/dL   Creatinine, Ser 0.61 0.61 - 1.24 mg/dL   Calcium 8.3 (L) 8.9 - 10.3 mg/dL   GFR, Estimated >60 >60 mL/min   Anion gap 8 5 - 15  Magnesium     Status: Abnormal   Collection Time: 09/17/20 12:39 AM  Result Value Ref Range   Magnesium 2.8 (H) 1.7 - 2.4 mg/dL  Phosphorus     Status: None   Collection Time: 09/17/20 12:39  AM  Result Value Ref Range   Phosphorus 4.3 2.5 - 4.6 mg/dL  Prepare RBC (crossmatch)     Status: None   Collection Time: 09/17/20  2:13 AM  Result Value Ref Range   Order Confirmation      ORDER PROCESSED BY BLOOD BANK Performed at [facility], [place] [address], [place], [place] [address]   Glucose, capillary     Status: Abnormal   Collection Time: 09/17/20  4:05 AM  Result Value Ref Range   Glucose-Capillary 118 (H) 70 - 99 mg/dL  Type and screen [place]     Status: None (Preliminary result)   Collection Time: 09/17/20  4:48 AM  Result Value Ref Range   ABO/RH(D) O POS    Antibody Screen NEG    Sample Expiration 09/20/2020,2359    Unit Number [id number]    Blood Component Type RED CELLS,LR    Unit division 00    Status of Unit ISSUED    Transfusion Status OK TO TRANSFUSE    Crossmatch Result      Compatible Performed at [facility], [place] [address], [place], [place] [address]   Glucose, capillary     Status:  Abnormal   Collection Time: 09/17/20  8:28 AM  Result Value Ref Range   Glucose-Capillary 119 (H) 70 - 99 mg/dL  Glucose, capillary     Status: None   Collection Time: 09/17/20 11:39 AM  Result Value Ref Range   Glucose-Capillary 82 70 - 99 mg/dL    Assessment & Plan: The plan of care was discussed with the bedside nurse for the day, who is in agreement with this plan and no additional concerns were raised.   Present on Admission: . Quadriparesis ([place])    LOS: 18 days   Additional comments:I reviewed the patient's new clinical lab test results.   and I reviewed the patients new imaging test results.    [facility]  C56fwith quadriparesis- S/P C4-5 ACDF and C3-T2 PSIF 10/10 by Dr. OZada Finders Diamox added for CSF leak and leak stopped. Acute hypoxic ventilator dependent respiratory failure-PEEP 12,weaning FiO2 as tolerated with likely PNA, scheduled duonebs,ABG today Trace R PTX- no PTX onf/uCXR Pre-existing medical problems- restarted home valproate, neurontin, synthroid, seroquel, effexor. Holding VPA/ser/effexor per family request. Bradycardia- this is due to his cord injury, 479s BP OK FEN- TF, bowel regimen. Remains hypernatremic and hyperchloremic. Urinary retention-expected given quadriparesis, Q6 I/O ID-AF, WBC normalized,on empiricMaxipime, repeat respcx again with normal resp flora.Bronch with BAL10/26, normal resp flora, d/c cefepime today ABL anemia- hgb stable VTE- PAS, LMWH Hypernatremia - free water flushes to350 q4,D5W, 1g calcium given for hyperkalemia Dispo- ICU  Critical Care Total Time: 45 minutes  AJesusita Oka MD Trauma & General Surgery Please use [email or web address] to contact on call provider  09/17/2020  *Care during the described time interval was provided by me. I have reviewed this patient's available data, including medical history, events of note, physical examination and test results as part of my evaluation.

## 2020-09-17 NOTE — Progress Notes (Signed)
Wasted 18 mL of fentanyl with Lynelle Doctor from previous tubing.

## 2020-09-17 NOTE — Progress Notes (Signed)
Phlebotomy made aware that patient has a blood transfusion ordered and that a type and screen has been ordered.

## 2020-09-18 LAB — TYPE AND SCREEN
ABO/RH(D): O POS
Antibody Screen: NEGATIVE
Unit division: 0

## 2020-09-18 LAB — CBC
HCT: 28.2 % — ABNORMAL LOW (ref 39.0–52.0)
Hemoglobin: 8.3 g/dL — ABNORMAL LOW (ref 13.0–17.0)
MCH: 30.4 pg (ref 26.0–34.0)
MCHC: 29.4 g/dL — ABNORMAL LOW (ref 30.0–36.0)
MCV: 103.3 fL — ABNORMAL HIGH (ref 80.0–100.0)
Platelets: 172 10*3/uL (ref 150–400)
RBC: 2.73 MIL/uL — ABNORMAL LOW (ref 4.22–5.81)
RDW: 19.9 % — ABNORMAL HIGH (ref 11.5–15.5)
WBC: 10.1 10*3/uL (ref 4.0–10.5)
nRBC: 1 % — ABNORMAL HIGH (ref 0.0–0.2)

## 2020-09-18 LAB — BPAM RBC
Blood Product Expiration Date: 202111022359
ISSUE DATE / TIME: 202110280558
Unit Type and Rh: 9500

## 2020-09-18 LAB — PHOSPHORUS: Phosphorus: 3.8 mg/dL (ref 2.5–4.6)

## 2020-09-18 LAB — GLUCOSE, CAPILLARY
Glucose-Capillary: 101 mg/dL — ABNORMAL HIGH (ref 70–99)
Glucose-Capillary: 103 mg/dL — ABNORMAL HIGH (ref 70–99)
Glucose-Capillary: 117 mg/dL — ABNORMAL HIGH (ref 70–99)
Glucose-Capillary: 144 mg/dL — ABNORMAL HIGH (ref 70–99)
Glucose-Capillary: 154 mg/dL — ABNORMAL HIGH (ref 70–99)
Glucose-Capillary: 95 mg/dL (ref 70–99)

## 2020-09-18 LAB — BASIC METABOLIC PANEL
Anion gap: 8 (ref 5–15)
BUN: 54 mg/dL — ABNORMAL HIGH (ref 6–20)
CO2: 27 mmol/L (ref 22–32)
Calcium: 8.3 mg/dL — ABNORMAL LOW (ref 8.9–10.3)
Chloride: 110 mmol/L (ref 98–111)
Creatinine, Ser: 0.54 mg/dL — ABNORMAL LOW (ref 0.61–1.24)
GFR, Estimated: >60 mL/min (ref >60)
Glucose, Bld: 131 mg/dL — ABNORMAL HIGH (ref 70–99)
Potassium: 4.5 mmol/L (ref 3.5–5.1)
Sodium: 145 mmol/L (ref 135–145)

## 2020-09-18 LAB — MAGNESIUM: Magnesium: 2.8 mg/dL — ABNORMAL HIGH (ref 1.7–2.4)

## 2020-09-18 MED ORDER — FREE WATER
250.0000 mL | Status: DC
  Administered 2020-09-18 – 2020-09-21 (×18): 250 mL

## 2020-09-18 MED ORDER — IPRATROPIUM-ALBUTEROL 0.5-2.5 (3) MG/3ML IN SOLN
3.0000 mL | Freq: Three times a day (TID) | RESPIRATORY_TRACT | Status: DC
  Administered 2020-09-18 – 2020-09-21 (×10): 3 mL via RESPIRATORY_TRACT
  Filled 2020-09-18 (×9): qty 3

## 2020-09-18 NOTE — Procedures (Signed)
Cortrak  Person Inserting Tube:  Antwaine Boomhower, RD Tube Type:  Cortrak - 43 inches Tube Location:  Right nare Initial Placement:  Stomach Secured by: Bridle Technique Used to Measure Tube Placement:  Documented cm marking at nare/ corner of mouth Cortrak Secured At:  77 cm    No x-ray is required. RN may begin using tube.   If the tube becomes dislodged please keep the tube and contact the Cortrak team at [email or web address] ([email or web address]) for replacement.  If after hours and replacement cannot be delayed, place a NG tube and confirm placement with an abdominal x-ray.    Abriel Geesey RD, LDN Clinical Nutrition Pager listed in AMION   

## 2020-09-18 NOTE — Progress Notes (Signed)
Physical Therapy Treatment Patient Details Name: Aaron [name] MRN: [id number] DOB: Mar 30, 1966 Today's Date: 09/18/2020    History of Present Illness This 54 y.o. male admitted after [facility] - no LOC, but unable to move Bil. UEs and LEs.  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  He underwent C4-5 ACDF, C3,C4,C5, T1,T2 laminectomies, C3-T2 PLIF.  Pt remained intubated post op.  Pt  PMH includes: Bipolar disorder, HTN; s/p ACDF C5-C7    PT Comments    The pt was in bed upon arrival of PT/OT, agreeable to session with focus on progressing ROM and strengthening exercises with chair position in bed. The pt was able to tolerate this change in position well, and was able to demo improvement in activation and ROM of both shoulder shrugs and elbow flexion. The pt continues to require significant assist to complete rolling for pericare and cleaning, requiring assist of +3. The pt also demos poor tolerance for rolling, with desat to low of 78% while in right sidelying. The pt recovered to SpO2 in 90s with return to supine, and will continue to benefit from skilled PT to progress capacity for transfers and ROM/strength exercises.     Follow Up Recommendations  CIR     Equipment Recommendations  Wheelchair (measurements PT);Wheelchair cushion (measurements PT);Hospital bed;Other (comment) (hoyer lift)    Recommendations for Other Services       Precautions / Restrictions Precautions Precautions: Other (comment);Back;Cervical Precaution Booklet Issued: No Precaution Comments: ETT, swollen scrotum, watch 02 with rolling Restrictions Weight Bearing Restrictions: No    Mobility  Bed Mobility Overal bed mobility: Needs Assistance Bed Mobility: Rolling Rolling: Total assist;+2 for physical assistance         General bed mobility comments: use of bed egress to chair position for exercises, pt tolerating well.  Rolling for pericare/cleaning with desat to 78%  Transfers                 General transfer comment: positioning and ROM only     Balance Overall balance assessment: Needs assistance   Sitting balance-Leahy Scale: Zero                                      Cognition Arousal/Alertness: Awake/alert Behavior During Therapy: Flat affect Overall Cognitive Status: Impaired/Different from baseline Area of Impairment: Following commands                       Following Commands: Follows one step commands consistently       General Comments: pt answering simple yes/no questions with eye movement and small head nods consistently. alert through session      Exercises General Exercises - Upper Extremity Shoulder Flexion: PROM;Both;10 reps;Supine Elbow Flexion: AAROM;Both;5 reps;Seated Elbow Extension: AAROM;Both;5 reps;Seated Digit Composite Flexion: PROM;Both;10 reps;Supine Composite Extension: PROM;Both;10 reps;Supine General Exercises - Lower Extremity Ankle Circles/Pumps: PROM;Both;10 reps;Supine Heel Slides: PROM;Both;10 reps;Supine Other Exercises Other Exercises: Shoulder shrugs x5 (able to activate left side some).    General Comments General comments (skin integrity, edema, etc.): Vent settings: 40%, PEEP 12. SpO2 at 90% at rest, desat to low of 78% with rlolling.       Pertinent Vitals/Pain Pain Assessment: Faces Faces Pain Scale: Hurts little more Pain Location: neck, bilateral shoulders, especially R arm with movement Pain Descriptors / Indicators:  Grimacing;Guarding Pain Intervention(s): Limited activity within patient's tolerance;Monitored during session;Repositioned           PT Goals (current goals can now be found in the care plan section) Acute Rehab PT Goals Patient Stated Goal: unable to state PT Goal Formulation: Patient unable to participate in goal setting Time For Goal Achievement: 09/29/20 Potential to Achieve  Goals: Fair Progress towards PT goals: Progressing toward goals (slowly)    Frequency    Min 3X/week      PT Plan Current plan remains appropriate    Co-evaluation PT/OT/SLP Co-Evaluation/Treatment: Yes Reason for Co-Treatment: Complexity of the patient's impairments (multi-system involvement);For patient/therapist safety;To address functional/ADL transfers PT goals addressed during session: Mobility/safety with mobility;Strengthening/ROM        AM-PAC PT "6 Clicks" Mobility   Outcome Measure  Help needed turning from your back to your side while in a flat bed without using bedrails?: Total Help needed moving from lying on your back to sitting on the side of a flat bed without using bedrails?: Total Help needed moving to and from a bed to a chair (including a wheelchair)?: Total Help needed standing up from a chair using your arms (e.g., wheelchair or bedside chair)?: Total Help needed to walk in hospital room?: Total Help needed climbing 3-5 steps with a railing? : Total 6 Click Score: 6    End of Session Equipment Utilized During Treatment: Oxygen (ETT) Activity Tolerance: Patient limited by fatigue Patient left: in bed;with call bell/phone within reach Nurse Communication: Mobility status PT Visit Diagnosis: Other abnormalities of gait and mobility (R26.89);Muscle weakness (generalized) (M62.81);Other symptoms and signs involving the nervous system (O70.786)     Time: 7544-9201 PT Time Calculation (min) (ACUTE ONLY): 38 min  Charges:  $Therapeutic Exercise: 8-22 mins $Therapeutic Activity: 8-22 mins                     Rolm Baptise, PT, DPT   Acute Rehabilitation Department Pager #: [phone]   Gaetana Michaelis 09/18/2020, 5:09 PM

## 2020-09-18 NOTE — Progress Notes (Signed)
Occupational Therapy Treatment Patient Details Name: Aaron [name] MRN: [id number] DOB: 08-18-1966 Today's Date: 09/18/2020    History of present illness This 54 y.o. male admitted after [facility] - no LOC, but unable to move Bil. UEs and LEs.  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  He underwent C4-5 ACDF, C3,C4,C5, T1,T2 laminectomies, C3-T2 PLIF.  Pt remained intubated post op.  Pt  PMH includes: Bipolar disorder, HTN; s/p ACDF C5-C7   OT comments  Pt seen in conjunction with PT.  Worked on [employer]. Biceps; AROM shoulder shrug, PROM, as well as tolerance for upright positioning.  Pt incontinent of loose stool and required +3 assist to roll and for peri care. 02 sats decreased to 78% with sidelying on the Right.  Follow Up Recommendations  LTACH    Equipment Recommendations       Recommendations for Other Services      Precautions / Restrictions Precautions Precautions: Other (comment);Back;Cervical Precaution Booklet Issued: No Precaution Comments: ETT, swollen scrotum, watch 02 with rolling Restrictions Weight Bearing Restrictions: No       Mobility Bed Mobility Overal bed mobility: Needs Assistance Bed Mobility: Rolling Rolling: Total assist;+2 for physical assistance         General bed mobility comments: use of bed egress to chair position for exercises, pt tolerating well. Rolling for pericare/cleaning with desat to 78%  Transfers                 General transfer comment: positioning and ROM only    Balance Overall balance assessment: Needs assistance   Sitting balance-Leahy Scale: Zero                                     ADL either performed or assessed with clinical judgement   ADL                               Toileting- Clothing Manipulation and Hygiene: Total assistance;+2 for physical assistance Toileting - Clothing Manipulation  Details (indicate cue type and reason): Pt incontinent of watery stool.  Assisted with peri care in supine        General ADL Comments: requires total A for all aspects of ADLs      Vision       Perception     Praxis      Cognition Arousal/Alertness: Awake/alert Behavior During Therapy: Flat affect Overall Cognitive Status: Difficult to assess Area of Impairment: Following commands                       Following Commands: Follows one step commands consistently       General Comments: pt answering simple yes/no questions with eye movement and small head nods consistently. alert through session        Exercises Exercises: General Upper Extremity;General Lower Extremity General Exercises - Upper Extremity Shoulder Flexion: PROM;Both;10 reps;Supine Elbow Flexion: AAROM;Both;5 reps;Seated Elbow Extension: AAROM;Both;5 reps;Seated Digit Composite Flexion: PROM;Both;10 reps;Supine Composite Extension: PROM;Both;10 reps;Supine General Exercises - Lower Extremity Ankle Circles/Pumps: PROM;Both;10 reps;Supine Heel Slides: PROM;Both;10 reps;Supine Other Exercises Other Exercises: Shoulder shrugs x5 (able to activate left side some).   Shoulder Instructions       General Comments Vent settings: 40%, PEEP 12. SpO2 at 90%  at rest, desat to low of 78% with rlolling.     Pertinent Vitals/ Pain       Pain Assessment: Faces Faces Pain Scale: Hurts little more Pain Location: neck, bilateral shoulders, especially R arm with movement Pain Descriptors / Indicators: Grimacing;Guarding Pain Intervention(s): Monitored during session;Limited activity within patient's tolerance  Home Living                                          Prior Functioning/Environment              Frequency  Min 2X/week        Progress Toward Goals  OT Goals(current goals can now be found in the care plan section)  Progress towards OT goals: Progressing toward  goals  Acute Rehab OT Goals Patient Stated Goal: unable to state  Plan Discharge plan needs to be updated    Co-evaluation    PT/OT/SLP Co-Evaluation/Treatment: Yes Reason for Co-Treatment: Complexity of the patient's impairments (multi-system involvement);To address functional/ADL transfers;For patient/therapist safety PT goals addressed during session: Mobility/safety with mobility;Strengthening/ROM OT goals addressed during session: Strengthening/ROM;ADL's and self-care      AM-PAC OT "6 Clicks" Daily Activity     Outcome Measure   Help from another person eating meals?: Total Help from another person taking care of personal grooming?: Total Help from another person toileting, which includes using toliet, bedpan, or urinal?: Total Help from another person bathing (including washing, rinsing, drying)?: Total Help from another person to put on and taking off regular upper body clothing?: Total Help from another person to put on and taking off regular lower body clothing?: Total 6 Click Score: 6    End of Session Equipment Utilized During Treatment: Oxygen  OT Visit Diagnosis: Muscle weakness (generalized) (M62.81)   Activity Tolerance Patient tolerated treatment well   Patient Left in bed;with call bell/phone within reach   Nurse Communication Mobility status        Time: 8657-8469 OT Time Calculation (min): 36 min  Charges: OT General Charges $OT Visit: 1 Visit OT Treatments $Therapeutic Activity: 8-22 mins  Eber Jones., OTR/L Acute Rehabilitation Services Pager [phone] Office [phone]    Jeani Hawking M 09/18/2020, 5:26 PM

## 2020-09-18 NOTE — Progress Notes (Signed)
Physical Therapy Wound Treatment Patient Details  Name: Aaron [name] MRN: [id number] Date of Birth: 12/06/1965  Today's Date: 09/18/2020 Time: 0941-1010 Time Calculation (min): 29 min  Subjective  Subjective: Lethargic 2 pain meds; on the vent.  Patient and Family Stated Goals: None stated Prior Treatments: Dressing changes  Pain Score:  Premedicated  Wound Assessment  Pressure Injury 09/07/20 Sacrum Bilateral Deep Tissue Pressure Injury - Purple or maroon localized area of discolored intact skin or blood-filled blister due to damage of underlying soft tissue from pressure and/or shear. (Active)  Dressing Type ABD;Barrier Film (skin prep);Gauze (Comment);Moist to dry 09/18/20 1300  Dressing Changed;Clean;Dry;Intact 09/18/20 1300  Dressing Change Frequency Daily 09/18/20 1300  State of Healing Eschar 09/18/20 1300  Site / Wound Assessment Black 09/18/20 1300  % Wound base Red or Granulating 25% 09/18/20 1300  % Wound base Yellow/Fibrinous Exudate 0% 09/18/20 1300  % Wound base Black/Eschar 75% 09/18/20 1300  Peri-wound Assessment Pink;Erythema (blanchable) 09/18/20 0000  Wound Length (cm) 8 cm 09/18/20 1300  Wound Width (cm) 9 cm 09/18/20 1300  Wound Depth (cm) 0.1 cm 09/18/20 1300  Wound Surface Area (cm^2) 72 cm^2 09/18/20 1300  Wound Volume (cm^3) 7.2 cm^3 09/18/20 1300  Margins Unattached edges (unapproximated) 09/18/20 1300  Drainage Amount Minimal 09/18/20 1300  Drainage Description Serosanguineous 09/18/20 1300  Treatment Debridement (Selective);Hydrotherapy (Pulse lavage);Packing (Saline gauze) 09/18/20 1300   Santyl applied to wound bed prior to applying dressing.     Hydrotherapy Pulsed lavage therapy - wound location: Sacrum Pulsed Lavage with Suction (psi): 12 psi Pulsed Lavage with Suction - Normal Saline Used: 1000 mL Pulsed Lavage Tip: Tip with splash shield Selective Debridement Selective Debridement - Location: Sacrum Selective Debridement - Tools Used:  Forceps;Scalpel Selective Debridement - Tissue Removed: Eschar   Wound Assessment and Plan  Wound Therapy - Assess/Plan/Recommendations Wound Therapy - Clinical Statement: RN prsent throughout hydrotherapy. Pt able to tolerate sidelying a little longer today and debridement was able to initiated. Re-measured wound to include full area beyond eschar. This patient will benefit from continued hydrotherapy for selective removal of unviable tissue, to decrease bioburden and promote wound bed healing.  Wound Therapy - Functional Problem List: Decreased mobility on vent Factors Delaying/Impairing Wound Healing: Multiple medical problems;Immobility;Polypharmacy Hydrotherapy Plan: Debridement;Dressing change;Patient/family education;Pulsatile lavage with suction Wound Therapy - Frequency: 6X / week Wound Therapy - Follow Up Recommendations: Other (comment) (Inpatient rehab) Wound Plan: See above  Wound Therapy Goals- Improve the function of patient's integumentary system by progressing the wound(s) through the phases of wound healing (inflammation - proliferation - remodeling) by: Decrease Necrotic Tissue to: 20% Decrease Necrotic Tissue - Progress: Progressing toward goal Increase Granulation Tissue to: 80% Increase Granulation Tissue - Progress: Progressing toward goal Goals/treatment plan/discharge plan were made with and agreed upon by patient/family: No, Patient unable to participate in goals/treatment/discharge plan and family unavailable Time For Goal Achievement: 7 days Wound Therapy - Potential for Goals: Good  Goals will be updated until maximal potential achieved or discharge criteria met.  Discharge criteria: when goals achieved, discharge from hospital, MD decision/surgical intervention, no progress towards goals, refusal/missing three consecutive treatments without notification or medical reason.  GP      D  09/18/2020, 2:10 PM    , PT, DPT Acute  Rehabilitation Services Pager: [phone] Office: [phone]   

## 2020-09-18 NOTE — Progress Notes (Signed)
Trauma/Critical Care Follow Up Note  Subjective:    Overnight Issues:   Objective:  Vital signs for last 24 hours: Temp:  [96.6 F (35.9 C)-97.8 F (36.6 C)] 96.6 F (35.9 C) (10/29 0800) Pulse Rate:  [41-58] 44 (10/29 0830) Resp:  [4-22] 20 (10/29 0830) BP: (92-149)/(45-117) 111/67 (10/29 0800) SpO2:  [90 %-100 %] 97 % (10/29 0830) FiO2 (%):  [40 %-60 %] 40 % (10/29 0830) Weight:  [109.3 kg] 109.3 kg (10/29 0500)  Hemodynamic parameters for last 24 hours:    Intake/Output from previous day: 10/28 0701 - 10/29 0700 In: 5603.4 [I.V.:2190.4; Blood:342; NG/GT:3021.1; IV Piggyback:50] Out: 2725 [Urine:2725]  Intake/Output this shift: Total I/O In: 505.1 [I.V.:85.1; NG/GT:420] Out: -   Vent settings for last 24 hours: Vent Mode: PRVC FiO2 (%):  [40 %-60 %] 40 % Set Rate:  [20 bmp] 20 bmp Vt Set:  [600 mL] 600 mL PEEP:  [10 TGG26-94 cmH20] 12 cmH20 Plateau Pressure:  [21 cmH20-27 cmH20] 27 cmH20  Physical Exam:  Gen: comfortable, no distress Neuro: grossly non-focal, follows commands HEENT: intubated Neck: supple CV: RRR Pulm: unlabored breathing, mechanically ventilated Abd: soft, nontender GU: clear, yellow urine Extr: wwp, no edema   Results for orders placed or performed during the hospital encounter of 09/12/2020 (from the past 24 hour(s))  Glucose, capillary     Status: None   Collection Time: 09/17/20 11:39 AM  Result Value Ref Range   Glucose-Capillary 82 70 - 99 mg/dL  Hemoglobin and hematocrit, blood     Status: Abnormal   Collection Time: 09/17/20 12:16 PM  Result Value Ref Range   Hemoglobin 8.5 (L) 13.0 - 17.0 g/dL   HCT 28.6 (L) 39 - 52 %  Glucose, capillary     Status: Abnormal   Collection Time: 09/17/20  3:57 PM  Result Value Ref Range   Glucose-Capillary 132 (H) 70 - 99 mg/dL  Glucose, capillary     Status: Abnormal   Collection Time: 09/17/20  7:50 PM  Result Value Ref Range   Glucose-Capillary 123 (H) 70 - 99 mg/dL  Glucose,  capillary     Status: Abnormal   Collection Time: 09/17/20 11:20 PM  Result Value Ref Range   Glucose-Capillary 122 (H) 70 - 99 mg/dL  CBC     Status: Abnormal   Collection Time: 09/18/20  2:11 AM  Result Value Ref Range   WBC 10.1 4.0 - 10.5 K/uL   RBC 2.73 (L) 4.22 - 5.81 MIL/uL   Hemoglobin 8.3 (L) 13.0 - 17.0 g/dL   HCT 28.2 (L) 39 - 52 %   MCV 103.3 (H) 80.0 - 100.0 fL   MCH 30.4 26.0 - 34.0 pg   MCHC 29.4 (L) 30.0 - 36.0 g/dL   RDW 19.9 (H) 11.5 - 15.5 %   Platelets 172 150 - 400 K/uL   nRBC 1.0 (H) 0.0 - 0.2 %  Basic metabolic panel     Status: Abnormal   Collection Time: 09/18/20  2:11 AM  Result Value Ref Range   Sodium 145 135 - 145 mmol/L   Potassium 4.5 3.5 - 5.1 mmol/L   Chloride 110 98 - 111 mmol/L   CO2 27 22 - 32 mmol/L   Glucose, Bld 131 (H) 70 - 99 mg/dL   BUN 54 (H) 6 - 20 mg/dL   Creatinine, Ser 0.54 (L) 0.61 - 1.24 mg/dL   Calcium 8.3 (L) 8.9 - 10.3 mg/dL   GFR, Estimated >60 >60 mL/min   Anion  gap 8 5 - 15  Magnesium     Status: Abnormal   Collection Time: 09/18/20  2:11 AM  Result Value Ref Range   Magnesium 2.8 (H) 1.7 - 2.4 mg/dL  Phosphorus     Status: None   Collection Time: 09/18/20  2:11 AM  Result Value Ref Range   Phosphorus 3.8 2.5 - 4.6 mg/dL  Glucose, capillary     Status: Abnormal   Collection Time: 09/18/20  3:32 AM  Result Value Ref Range   Glucose-Capillary 117 (H) 70 - 99 mg/dL  Glucose, capillary     Status: Abnormal   Collection Time: 09/18/20  7:41 AM  Result Value Ref Range   Glucose-Capillary 154 (H) 70 - 99 mg/dL    Assessment & Plan: The plan of care was discussed with the bedside nurse for the day, who is in agreement with this plan and no additional concerns were raised.   Present on Admission: . Quadriparesis ([place])    LOS: 19 days   Additional comments:I reviewed the patient's new clinical lab test results.   and I reviewed the patients new imaging test results.    [facility]  C16fwith quadriparesis- S/P C4-5  ACDF and C3-T2 PSIF 10/10 by Dr. OZada Finders Diamox added for CSF leak and leak stopped. Acute hypoxic ventilator dependent respiratory failure-PEEP 12,weaning FiO2as tolerated, scheduled duonebs Trace R PTX- no PTX onf/uCXR Pre-existing medical problems- restarted home valproate, neurontin, synthroid, seroquel, effexor. Holding VPA/ser/effexor per family request. Bradycardia- this is due to his cord injury, 433s BP OK FEN- TF, bowel regimen. Remains hypernatremic and hyperchloremic. Urinary retention-expected given quadriparesis, Q6 I/O ID-AF, WBC normalized,s/p 7d empiricMaxipime,respcx x2 and BAL10/26, normal resp flora ABL anemia- hgb stable VTE- PAS, LMWH Hypernatremia - free water flushes to250 q4,D5W Dispo- ICU  Critical Care Total Time: 40 minutes  AJesusita Oka MD Trauma & General Surgery Please use [email or web address] to contact on call provider  09/18/2020  *Care during the described time interval was provided by me. I have reviewed this patient's available data, including medical history, events of note, physical examination and test results as part of my evaluation.

## 2020-09-19 LAB — CBC
HCT: 27.7 % — ABNORMAL LOW (ref 39.0–52.0)
Hemoglobin: 8.2 g/dL — ABNORMAL LOW (ref 13.0–17.0)
MCH: 30.4 pg (ref 26.0–34.0)
MCHC: 29.6 g/dL — ABNORMAL LOW (ref 30.0–36.0)
MCV: 102.6 fL — ABNORMAL HIGH (ref 80.0–100.0)
Platelets: 184 10*3/uL (ref 150–400)
RBC: 2.7 MIL/uL — ABNORMAL LOW (ref 4.22–5.81)
RDW: 18.9 % — ABNORMAL HIGH (ref 11.5–15.5)
WBC: 7.6 10*3/uL (ref 4.0–10.5)
nRBC: 0.8 % — ABNORMAL HIGH (ref 0.0–0.2)

## 2020-09-19 LAB — BASIC METABOLIC PANEL
Anion gap: 9 (ref 5–15)
BUN: 53 mg/dL — ABNORMAL HIGH (ref 6–20)
CO2: 26 mmol/L (ref 22–32)
Calcium: 8.2 mg/dL — ABNORMAL LOW (ref 8.9–10.3)
Chloride: 107 mmol/L (ref 98–111)
Creatinine, Ser: 0.52 mg/dL — ABNORMAL LOW (ref 0.61–1.24)
GFR, Estimated: >60 mL/min (ref >60)
Glucose, Bld: 136 mg/dL — ABNORMAL HIGH (ref 70–99)
Potassium: 4.6 mmol/L (ref 3.5–5.1)
Sodium: 142 mmol/L (ref 135–145)

## 2020-09-19 LAB — GLUCOSE, CAPILLARY
Glucose-Capillary: 112 mg/dL — ABNORMAL HIGH (ref 70–99)
Glucose-Capillary: 91 mg/dL (ref 70–99)
Glucose-Capillary: 106 mg/dL — ABNORMAL HIGH (ref 70–99)
Glucose-Capillary: 139 mg/dL — ABNORMAL HIGH (ref 70–99)
Glucose-Capillary: 118 mg/dL — ABNORMAL HIGH (ref 70–99)

## 2020-09-19 LAB — MAGNESIUM: Magnesium: 2.4 mg/dL (ref 1.7–2.4)

## 2020-09-19 LAB — PHOSPHORUS: Phosphorus: 3.8 mg/dL (ref 2.5–4.6)

## 2020-09-19 MED ORDER — FUROSEMIDE 10 MG/ML IJ SOLN
40.0000 mg | Freq: Once | INTRAMUSCULAR | Status: AC
  Administered 2020-09-19: 40 mg via INTRAVENOUS
  Filled 2020-09-19: qty 4

## 2020-09-19 NOTE — Progress Notes (Signed)
Physical Therapy Wound Treatment Patient Details  Name: Aaron [name] MRN: [id number] Date of Birth: 05-03-1966  Today's Date: 09/19/2020 Time: 1000-1030 Time Calculation (min): 30 min  Subjective  Subjective: More awake tody, on the vent Patient and Family Stated Goals: None stated Prior Treatments: Dressing changes  Pain Score:    Wound Assessment  Pressure Injury 09/07/20 Sacrum Bilateral Deep Tissue Pressure Injury - Purple or maroon localized area of discolored intact skin or blood-filled blister due to damage of underlying soft tissue from pressure and/or shear. (Active)  Wound Image   09/17/20 1454  Dressing Type Barrier Film (skin prep);ABD;Moist to dry;Gauze (Comment) 09/19/20 1033  Dressing Changed 09/19/20 1033  Dressing Change Frequency Daily 09/19/20 1033  State of Healing Eschar 09/19/20 1033  Site / Wound Assessment Pink;Black;Yellow 09/19/20 1033  % Wound base Red or Granulating 25% 09/19/20 1033  % Wound base Yellow/Fibrinous Exudate 10% 09/19/20 1033  % Wound base Black/Eschar 65% 09/19/20 1033  Peri-wound Assessment Pink;Erythema (blanchable) 09/19/20 1033  Wound Length (cm) 8 cm 09/18/20 1300  Wound Width (cm) 9 cm 09/18/20 1300  Wound Depth (cm) 0.1 cm 09/18/20 1300  Wound Surface Area (cm^2) 72 cm^2 09/18/20 1300  Wound Volume (cm^3) 7.2 cm^3 09/18/20 1300  Margins Unattached edges (unapproximated) 09/19/20 1033  Drainage Amount Moderate 09/19/20 1033  Drainage Description Serosanguineous 09/19/20 1033  Treatment Debridement (Selective);Hydrotherapy (Pulse lavage);Packing (Saline gauze) 09/19/20 1033     Santyl applied to wound bed prior to applying dressing.  Hydrotherapy Pulsed lavage therapy - wound location: Sacrum Pulsed Lavage with Suction (psi): 12 psi Pulsed Lavage with Suction - Normal Saline Used: 1000 mL Pulsed Lavage Tip: Tip with splash shield Selective Debridement Selective Debridement - Location: Sacrum Selective Debridement - Tools  Used: Forceps;Scissors Selective Debridement - Tissue Removed: Eschar   Wound Assessment and Plan  Wound Therapy - Assess/Plan/Recommendations Wound Therapy - Clinical Statement: RN present throughout session.  patient better able to tolerate sidelying today.  Able to debridement today.  Patient will benefit from continued hydrotherapy to cleanse wound and promote healing. Wound Therapy - Functional Problem List: Decreased mobility on vent Factors Delaying/Impairing Wound Healing: Multiple medical problems;Immobility;Polypharmacy Hydrotherapy Plan: Debridement;Dressing change;Patient/family education;Pulsatile lavage with suction Wound Therapy - Frequency: 6X / week Wound Therapy - Follow Up Recommendations: Other (comment) (CIR) Wound Plan: See above  Wound Therapy Goals- Improve the function of patient's integumentary system by progressing the wound(s) through the phases of wound healing (inflammation - proliferation - remodeling) by: Decrease Necrotic Tissue to: 20% Decrease Necrotic Tissue - Progress: Progressing toward goal Increase Granulation Tissue to: 80% Increase Granulation Tissue - Progress: Progressing toward goal  Goals will be updated until maximal potential achieved or discharge criteria met.  Discharge criteria: when goals achieved, discharge from hospital, MD decision/surgical intervention, no progress towards goals, refusal/missing three consecutive treatments without notification or medical reason.  GP     Aaron [name] 09/19/2020, 10:41 AM  09/19/2020 Lesleigh Noe, PT Acute Rehabilitation Services Pager:  [phone] Office:  [phone]

## 2020-09-19 NOTE — Progress Notes (Addendum)
Trauma/Critical Care Follow Up Note  Subjective:    Overnight Issues: desat with turn o/n, back up to 100% FiO2  Objective:  Vital signs for last 24 hours: Temp:  [96.4 F (35.8 C)-100.3 F (37.9 C)] 99.1 F (37.3 C) (10/30 0800) Pulse Rate:  [43-58] 48 (10/30 0821) Resp:  [0-22] 20 (10/30 0821) BP: (97-122)/(53-76) 116/65 (10/30 0600) SpO2:  [88 %-99 %] 98 % (10/30 0821) FiO2 (%):  [40 %-100 %] 100 % (10/30 0821)  Hemodynamic parameters for last 24 hours:    Intake/Output from previous day: 10/29 0701 - 10/30 0700 In: 2793.4 [I.V.:1961.6; NG/GT:831.8] Out: 2250 [Urine:2250]  Intake/Output this shift: No intake/output data recorded.  Vent settings for last 24 hours: Vent Mode: PRVC FiO2 (%):  [40 %-100 %] 100 % Set Rate:  [20 bmp] 20 bmp Vt Set:  [600 mL] 600 mL PEEP:  [12 cmH20] 12 cmH20 Plateau Pressure:  [24 VQQ59-56 cmH20] 27 cmH20  Physical Exam:  Gen: comfortable, no distress Neuro: quad, follows commands, interactive HEENT: intubated Neck: supple CV: RRR Pulm: unlabored breathing, mechanically ventilated Abd: soft, nontender GU: I/O q6 Extr: wwp, 1+ edema   Results for orders placed or performed during the hospital encounter of 08/31/2020 (from the past 24 hour(s))  Glucose, capillary     Status: None   Collection Time: 09/18/20 11:50 AM  Result Value Ref Range   Glucose-Capillary 95 70 - 99 mg/dL  Glucose, capillary     Status: Abnormal   Collection Time: 09/18/20  4:08 PM  Result Value Ref Range   Glucose-Capillary 144 (H) 70 - 99 mg/dL  Glucose, capillary     Status: Abnormal   Collection Time: 09/18/20  7:43 PM  Result Value Ref Range   Glucose-Capillary 101 (H) 70 - 99 mg/dL  Glucose, capillary     Status: Abnormal   Collection Time: 09/18/20 11:41 PM  Result Value Ref Range   Glucose-Capillary 103 (H) 70 - 99 mg/dL  Glucose, capillary     Status: Abnormal   Collection Time: 09/19/20  3:25 AM  Result Value Ref Range   Glucose-Capillary  112 (H) 70 - 99 mg/dL  Glucose, capillary     Status: None   Collection Time: 09/19/20  8:26 AM  Result Value Ref Range   Glucose-Capillary 91 70 - 99 mg/dL    Assessment & Plan: The plan of care was discussed with the bedside nurse for the day, who is in agreement with this plan and no additional concerns were raised.   Present on Admission: . Quadriparesis ([place])    LOS: 20 days   Additional comments:I reviewed the patient's new clinical lab test results.   and I reviewed the patients new imaging test results.    [facility]  C15fwith quadriparesis- S/P C4-5 ACDF and C3-T2 PSIF 10/10 by Dr. OZada Finders Diamox added for CSF leak and leak stopped. Acute hypoxic ventilator dependent respiratory failure-PEEP 12,weaning FiO2as tolerated, scheduled duonebs, chest physio/quad cough. Desats with turns. Lasix x1 today Trace R PTX- no PTX onf/uCXR Pre-existing medical problems- restarted home valproate, neurontin, synthroid, seroquel, effexor. Holding VPA/ser/effexor per family request. Bradycardia- this is due to his cord injury, 442s BP OK FEN- TF, bowel regimen. Remains hypernatremic and hyperchloremic. Urinary retention-expected given quadriparesis, Q6 I/O ID-AF, WBCnormalized,s/p 7d empiricMaxipime,respcx x2 and BAL10/26,normal resp flora Sacral pressure ulcer, stage 4 - continue local wound care with hydrotherapy and pressure offloading. Located near the anus, so surgical debridement would likely necessitate colostomy creation. ABL anemia- hgb stable VTE-  PAS, LMWH Hypernatremia - free water flushes to250 q4,D5W Dispo- ICU  Critical Care Total Time: 45 minutes  Jesusita Oka, MD Trauma & General Surgery Please use [email or web address] to contact on call provider  09/19/2020  *Care during the described time interval was provided by me. I have reviewed this patient's available data, including medical history, events of note, physical examination and test results  as part of my evaluation.

## 2020-09-19 NOTE — Progress Notes (Signed)
Occupational Therapy Treatment Patient Details Name: Aaron [name] MRN: [id number] DOB: 1966/03/05 Today's Date: 09/19/2020    History of present illness This 54 y.o. male admitted after [facility] - no LOC, but unable to move Bil. UEs and LEs.  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  He underwent C4-5 ACDF, C3,C4,C5, T1,T2 laminectomies, C3-T2 PLIF.  Pt remained intubated post op.  Pt  PMH includes: Bipolar disorder, HTN; s/p ACDF C5-C7   OT comments  Pt seen for scrotal sling management due to request from RN staff. Pt had just had scrotal sling washed due to it becoming soiled. Pt required total A +2 for repositioning in bed (+3 helpful for extra set of hands). Scrotal sling reapplied to help reduce swelling. Sling was not formally tied around trunk due to pts baseline immobility. RN staff re-educated on proper placement. D/c recs remain appropriate, will continue to follow.    Follow Up Recommendations  [facility]    Equipment Recommendations  Hospital bed;Wheelchair (measurements OT);Wheelchair cushion (measurements OT)    Recommendations for Other Services      Precautions / Restrictions Precautions Precautions: Other (comment);Back;Cervical Precaution Booklet Issued: No Precaution Comments: ETT, swollen scrotum, watch 02 with rolling Required Braces or Orthoses: Other Brace Other Brace: scrotal sling Restrictions Weight Bearing Restrictions: No       Mobility Bed Mobility Overal bed mobility: Needs Assistance Bed Mobility: Rolling Rolling: Total assist;+2 for physical assistance         General bed mobility comments: for repositioning and reapplication of scrotal sling  Transfers                      Balance                                           ADL either performed or assessed with clinical judgement   ADL Overall ADL's : Needs assistance/impaired                                        General ADL Comments: requires total A for all aspects of ADLs. Session focused on scrotal sling application and education to staff     Vision       Perception     Praxis      Cognition Arousal/Alertness: Awake/alert Behavior During Therapy: Flat affect Overall Cognitive Status: Difficult to assess Area of Impairment: Following commands                       Following Commands: Follows one step commands consistently       General Comments: pt answering simple yes/no questions with eye movement and small head nods consistently. alert through session        Exercises     Shoulder Instructions       General Comments      Pertinent Vitals/ Pain       Pain Assessment: No/denies pain  Home Living  Prior Functioning/Environment              Frequency  Min 2X/week        Progress Toward Goals  OT Goals(current goals can now be found in the care plan section)  Progress towards OT goals: Progressing toward goals  Acute Rehab OT Goals Patient Stated Goal: unable to state OT Goal Formulation: Patient unable to participate in goal setting Time For Goal Achievement: 09/16/20 Potential to Achieve Goals: Good  Plan Discharge plan needs to be updated    Co-evaluation                 AM-PAC OT "6 Clicks" Daily Activity     Outcome Measure   Help from another person eating meals?: Total Help from another person taking care of personal grooming?: Total Help from another person toileting, which includes using toliet, bedpan, or urinal?: Total Help from another person bathing (including washing, rinsing, drying)?: Total Help from another person to put on and taking off regular upper body clothing?: Total Help from another person to put on and taking off regular lower body clothing?: Total 6 Click Score: 6    End of Session  Equipment Utilized During Treatment: Oxygen  OT Visit Diagnosis: Muscle weakness (generalized) (M62.81)   Activity Tolerance Patient tolerated treatment well   Patient Left in bed;with call bell/phone within reach   Nurse Communication Mobility status        Time: 3419-6222 OT Time Calculation (min): 10 min  Charges: OT General Charges $OT Visit: 1 Visit OT Treatments $Therapeutic Activity: 8-22 mins  Aaron [name], MSOT, OTR/L Acute Rehabilitation Services [facility] Office Number: [phone] Pager: [phone]  Aaron [name] 09/19/2020, 6:10 PM

## 2020-09-20 LAB — GLUCOSE, CAPILLARY
Glucose-Capillary: 102 mg/dL — ABNORMAL HIGH (ref 70–99)
Glucose-Capillary: 106 mg/dL — ABNORMAL HIGH (ref 70–99)
Glucose-Capillary: 110 mg/dL — ABNORMAL HIGH (ref 70–99)
Glucose-Capillary: 110 mg/dL — ABNORMAL HIGH (ref 70–99)
Glucose-Capillary: 99 mg/dL (ref 70–99)

## 2020-09-20 NOTE — Progress Notes (Signed)
21 Days Post-Op   Subjective/Chief Complaint: Opens eyes to voice   Objective: Vital signs in last 24 hours: Temp:  [96.7 F (35.9 C)-98.4 F (36.9 C)] 98.3 F (36.8 C) (10/31 0428) Pulse Rate:  [45-63] 46 (10/31 0900) Resp:  [8-23] 20 (10/31 0900) BP: (100-151)/(56-97) 113/78 (10/31 0900) SpO2:  [93 %-100 %] 96 % (10/31 0900) FiO2 (%):  [50 %-60 %] 50 % (10/31 0742) Last BM Date: 09/18/20  Intake/Output from previous day: 10/30 0701 - 10/31 0700 In: 4270.9 [I.V.:2130.9; NG/GT:2140] Out: 3125 [Urine:2975; Stool:150] Intake/Output this shift: Total I/O In: 1149.9 [I.V.:169.9; NG/GT:980] Out: -   Gen: comfortable, no distress Neuro: quad, opens eyets, interactive HEENT: intubated Neck: supple CV: RRR Pulm: coarse bilaterally,  mechanically ventilated Abd: soft, nontender Extr: cr < 2 secs, 1+ edema  Lab Results:  Recent Labs    09/18/20 0211 09/19/20 1107  WBC 10.1 7.6  HGB 8.3* 8.2*  HCT 28.2* 27.7*  PLT 172 184   BMET Recent Labs    09/18/20 0211 09/19/20 1107  NA 145 142  K 4.5 4.6  CL 110 107  CO2 27 26  GLUCOSE 131* 136*  BUN 54* 53*  CREATININE 0.54* 0.52*  CALCIUM 8.3* 8.2*   PT/INR No results for input(s): LABPROT, INR in the last 72 hours. ABG No results for input(s): PHART, HCO3 in the last 72 hours.  Invalid input(s): PCO2, PO2  Studies/Results: No results found.  Anti-infectives: Anti-infectives (From admission, onward)   Start     Dose/Rate Route Frequency Ordered Stop   09/16/20 1000  ceFEPIme (MAXIPIME) 2 g in sodium chloride 0.9 % 100 mL IVPB  Status:  Discontinued        2 g 200 mL/hr over 30 Minutes Intravenous Every 8 hours 09/16/20 0924 09/17/20 1143   09/11/20 1030  ceFEPIme (MAXIPIME) 2 g in sodium chloride 0.9 % 100 mL IVPB        2 g 200 mL/hr over 30 Minutes Intravenous Every 8 hours 09/11/20 0922 09/16/20 0302   09/01/20 1000  ceFEPIme (MAXIPIME) 2 g in sodium chloride 0.9 % 100 mL IVPB  Status:  Discontinued         2 g 200 mL/hr over 30 Minutes Intravenous Every 8 hours 09/01/20 0906 09/04/20 1114      Assessment/Plan: MCC  C19fwith quadriparesis- S/P C4-5 ACDF and C3-T2 PSIF 10/10 by Dr. OZada Finders Csf leak resolved Acute hypoxic ventilator dependent respiratory failure-weaning FiO2as tolerated, scheduled duonebs, chest physio/quad cough. Trace R PTX- no PTX onf/uCXR Pre-existing medical problems- restarted home valproate, neurontin, synthroid, seroquel, effexor. Holding VPA/ser/effexor per family request. Bradycardia- this is due to his cord injury, 467s BP OK, no more neurogenic shock present FEN- TF, bowel regimen. recheck labs am Urinary retention-expected given quadriparesis, Q6 I/O ID-AF, WBCnormalized before,s/p 7dempiricMaxipime,respcxx2 andBAL10/26,normal resp flora Sacral pressure ulcer, stage 4 - continue local wound care with hydrotherapy and pressure offloading. ABL anemia- hgb stable will recheck in am VTE- PAS, LMWH Hypernatremia - free water flushes to250 q4,D5W Dispo- ICU  I spent 32 minutes cc time with patient  MRolm Bookbinder10/31/2021

## 2020-09-21 ENCOUNTER — Inpatient Hospital Stay (HOSPITAL_COMMUNITY): Payer: Medicare Other

## 2020-09-21 ENCOUNTER — Inpatient Hospital Stay (HOSPITAL_COMMUNITY): Payer: Medicare Other | Admitting: Certified Registered Nurse Anesthetist

## 2020-09-21 LAB — GLUCOSE, CAPILLARY
Glucose-Capillary: 125 mg/dL — ABNORMAL HIGH (ref 70–99)
Glucose-Capillary: 93 mg/dL (ref 70–99)

## 2020-09-21 LAB — POCT I-STAT 7, (LYTES, BLD GAS, ICA,H+H)
Acid-Base Excess: 6 mmol/L — ABNORMAL HIGH (ref 0.0–2.0)
Bicarbonate: 32.8 mmol/L — ABNORMAL HIGH (ref 20.0–28.0)
Calcium, Ion: 1.27 mmol/L (ref 1.15–1.40)
HCT: 27 % — ABNORMAL LOW (ref 39.0–52.0)
Hemoglobin: 9.2 g/dL — ABNORMAL LOW (ref 13.0–17.0)
O2 Saturation: 97 %
Patient temperature: 97.8
Potassium: 4.6 mmol/L (ref 3.5–5.1)
Sodium: 141 mmol/L (ref 135–145)
TCO2: 35 mmol/L — ABNORMAL HIGH (ref 22–32)
pCO2 arterial: 59 mmHg — ABNORMAL HIGH (ref 32.0–48.0)
pH, Arterial: 7.351 (ref 7.350–7.450)
pO2, Arterial: 95 mmHg (ref 83.0–108.0)

## 2020-09-21 MED ORDER — POTASSIUM CHLORIDE 20 MEQ/15ML (10%) PO SOLN
40.0000 meq | Freq: Two times a day (BID) | ORAL | Status: DC
  Administered 2020-09-21: 40 meq
  Filled 2020-09-21: qty 30

## 2020-09-21 MED ORDER — PSYLLIUM 95 % PO PACK
1.0000 | PACK | Freq: Every day | ORAL | Status: DC
  Filled 2020-09-21: qty 1

## 2020-09-21 MED ORDER — ATROPINE SULFATE 1 MG/10ML IJ SOSY
PREFILLED_SYRINGE | INTRAMUSCULAR | Status: AC
  Filled 2020-09-21: qty 10

## 2020-09-21 MED ORDER — ETOMIDATE 2 MG/ML IV SOLN
INTRAVENOUS | Status: DC | PRN
  Administered 2020-09-21: 14 mg via INTRAVENOUS

## 2020-09-21 MED ORDER — EPINEPHRINE 1 MG/10ML IJ SOSY
PREFILLED_SYRINGE | INTRAMUSCULAR | Status: AC
  Filled 2020-09-21: qty 30

## 2020-09-21 MED ORDER — FUROSEMIDE 10 MG/ML IJ SOLN
80.0000 mg | Freq: Once | INTRAMUSCULAR | Status: AC
  Administered 2020-09-21: 80 mg via INTRAVENOUS
  Filled 2020-09-21: qty 8

## 2020-09-21 MED ORDER — SUCCINYLCHOLINE CHLORIDE 20 MG/ML IJ SOLN
INTRAMUSCULAR | Status: DC | PRN
  Administered 2020-09-21: 140 mg via INTRAVENOUS

## 2020-09-21 MED ORDER — MIDAZOLAM HCL 2 MG/2ML IJ SOLN
INTRAMUSCULAR | Status: AC
  Filled 2020-09-21: qty 2

## 2020-09-21 DEATH — deceased

## 2020-09-23 MED FILL — Medication: Qty: 1 | Status: AC

## 2020-10-21 NOTE — Progress Notes (Signed)
   10/03/2020 1230  Clinical Encounter Type  Visited With Other (Comment)  Visit Type Code  Referral From Nurse  Consult/Referral To Chaplain  Chaplain responded to page. The patient's family was not present. Chaplain provided support to the staff.This note was prepared by Deneen Harts, M.Div..  For questions please contact by phone [phone].

## 2020-10-21 NOTE — Progress Notes (Signed)
Physical Therapy Wound Treatment Patient Details  Name: Aaron [name] MRN: [id number] Date of Birth: 17-May-1966  Today's Date: 2020/10/20 Time: 7893-8101 Time Calculation (min): 23 min  Subjective  Subjective: More awake today, on the vent Patient and Family Stated Goals: None stated Prior Treatments: Dressing changes  Pain Score:  Pt was premedicated and acknowledges some pain prior to session beginning. Overall tolerated treatment well from a pain perspective.   Wound Assessment  Pressure Injury 09/07/20 Sacrum Bilateral Deep Tissue Pressure Injury - Purple or maroon localized area of discolored intact skin or blood-filled blister due to damage of underlying soft tissue from pressure and/or shear. (Active)  Dressing Type ABD;Barrier Film (skin prep);Gauze (Comment);Moist to dry 2020/10/20 1311  Dressing Changed;Clean;Dry;Intact Oct 20, 2020 1311  Dressing Change Frequency Daily 10/20/20 1311  State of Healing Eschar 2020/10/20 1311  Site / Wound Assessment Yellow;Black;Brown;Red 10/20/20 1311  % Wound base Red or Granulating 25% 2020-10-20 1311  % Wound base Yellow/Fibrinous Exudate 10% 2020/10/20 1311  % Wound base Black/Eschar 65% 2020/10/20 1311  Peri-wound Assessment Intact;Bleeding October 20, 2020 1311  Wound Length (cm) 8 cm 09/18/20 1300  Wound Width (cm) 9 cm 09/18/20 1300  Wound Depth (cm) 0.1 cm 09/18/20 1300  Wound Surface Area (cm^2) 72 cm^2 09/18/20 1300  Wound Volume (cm^3) 7.2 cm^3 09/18/20 1300  Margins Unattached edges (unapproximated) 2020-10-20 1311  Drainage Amount Minimal 10-20-20 1311  Drainage Description Serosanguineous 10-20-2020 1311  Treatment Debridement (Selective);Hydrotherapy (Pulse lavage);Packing (Saline gauze) 10-20-20 1311   Santyl applied to wound bed prior to applying dressing.     Hydrotherapy Pulsed lavage therapy - wound location: Sacrum Pulsed Lavage with Suction (psi): 12 psi Pulsed Lavage with Suction - Normal Saline Used: 1000 mL Pulsed Lavage Tip: Tip  with splash shield Selective Debridement Selective Debridement - Location: Sacrum Selective Debridement - Tools Used: Forceps;Scalpel Selective Debridement - Tissue Removed: Eschar   Wound Assessment and Plan  Wound Therapy - Assess/Plan/Recommendations Wound Therapy - Clinical Statement: RN present throughout session to assist with positioning and vent management. Session limited due to respiratory status and decreasing O2 sats. Minimal debridement completed; dressing was replaced and pt positioned back in supine where O2 sats improved back into the low 90's. This patient will benefit from continued hydrotherapy for selective removal of unviable tissue, to decrease bioburden and promote wound bed healing.  Wound Therapy - Functional Problem List: Decreased mobility on vent Factors Delaying/Impairing Wound Healing: Multiple medical problems;Immobility;Polypharmacy Hydrotherapy Plan: Debridement;Dressing change;Patient/family education;Pulsatile lavage with suction Wound Therapy - Frequency: 6X / week Wound Therapy - Follow Up Recommendations: Other (comment) (CIR) Wound Plan: See above  Wound Therapy Goals- Improve the function of patient's integumentary system by progressing the wound(s) through the phases of wound healing (inflammation - proliferation - remodeling) by: Decrease Necrotic Tissue to: 20% Decrease Necrotic Tissue - Progress: Progressing toward goal Increase Granulation Tissue to: 80% Increase Granulation Tissue - Progress: Progressing toward goal Goals/treatment plan/discharge plan were made with and agreed upon by patient/family: No, Patient unable to participate in goals/treatment/discharge plan and family unavailable Time For Goal Achievement: 7 days Wound Therapy - Potential for Goals: Good  Goals will be updated until maximal potential achieved or discharge criteria met.  Discharge criteria: when goals achieved, discharge from hospital, MD decision/surgical intervention,  no progress towards goals, refusal/missing three consecutive treatments without notification or medical reason.  GP     Thelma Comp 10-20-2020, 1:21 PM   Rolinda Roan, PT, DPT Acute Rehabilitation Services Pager: [phone] Office: [phone]

## 2020-10-21 NOTE — Progress Notes (Signed)
Follow up - Trauma Critical Care  Patient Details:    Aaron [name] is an 54 y.o. male.  Lines/tubes : Airway 8 mm (Active)  Secured at (cm) 25 cm Sep 25, 2020 0744  Measured From Lips 09/25/2020 [place] September 25, 2020 0744  Secured By [employer] September 25, 2020 0744  Tube Holder Repositioned Yes 2020-09-25 0744  Cuff Pressure (cm H2O) 30 cm H2O 09/25/20 0317  Site Condition Cool;Dry 09/25/20 0744     Rectal Tube/Pouch (Active)  Output (mL) 150 mL 09-25-2020 0700    Microbiology/Sepsis markers: Results for orders placed or performed during the hospital encounter of 09/08/2020  Respiratory Panel by RT PCR (Flu A&B, Covid) - Nasopharyngeal [name]     Status: None   Collection Time: 09/15/2020  1:42 AM   Specimen: Nasopharyngeal [name]  Result Value Ref Range Status   SARS Coronavirus 2 by RT PCR NEGATIVE NEGATIVE Final    Comment: (NOTE) SARS-CoV-2 target nucleic acids are NOT DETECTED.  The SARS-CoV-2 RNA is generally detectable in upper respiratoy specimens during the acute phase of infection. The lowest concentration of SARS-CoV-2 viral copies this assay can detect is 131 copies/mL. A negative result does not preclude SARS-Cov-2 infection and should not be used as the sole basis for treatment or other patient management decisions. A negative result may occur with  improper specimen collection/handling, submission of specimen other than nasopharyngeal [name], presence of viral mutation(s) within the areas targeted by this assay, and inadequate number of viral copies (<131 copies/mL). A negative result must be combined with clinical observations, patient history, and epidemiological information. The expected result is Negative.  Fact Sheet for Patients:  PinkCheek.be  Fact Sheet for Healthcare Providers:  GravelBags.it  This test is no t yet approved or cleared by the [place] FDA and  has been authorized  for detection and/or diagnosis of SARS-CoV-2 by FDA under an Emergency Use Authorization (EUA). This EUA will remain  in effect (meaning this test can be used) for the duration of the COVID-19 declaration under Section 564(b)(1) of the Act, 21 U.S.C. section 360bbb-3(b)(1), unless the authorization is terminated or revoked sooner.     Influenza A by PCR NEGATIVE NEGATIVE Final   Influenza B by PCR NEGATIVE NEGATIVE Final    Comment: (NOTE) The Xpert Xpress SARS-CoV-2/FLU/RSV assay is intended as an aid in  the diagnosis of influenza from Nasopharyngeal [name] specimens and  should not be used as a sole basis for treatment. Nasal washings and  aspirates are unacceptable for Xpert Xpress SARS-CoV-2/FLU/RSV  testing.  Fact Sheet for Patients: PinkCheek.be  Fact Sheet for Healthcare Providers: GravelBags.it  This test is not yet approved or cleared by the [place] FDA and  has been authorized for detection and/or diagnosis of SARS-CoV-2 by  FDA under an Emergency Use Authorization (EUA). This EUA will remain  in effect (meaning this test can be used) for the duration of the  Covid-19 declaration under Section 564(b)(1) of the Act, 21  U.S.C. section 360bbb-3(b)(1), unless the authorization is  terminated or revoked. Performed at [facility], [place] [address] [place], [place] [address]   MRSA PCR Screening     Status: None   Collection Time: 09/06/2020 11:48 PM   Specimen: Nasal Mucosa; Nasopharyngeal  Result Value Ref Range Status   MRSA by PCR NEGATIVE NEGATIVE Final    Comment:        The GeneXpert MRSA Assay (FDA approved for NASAL specimens only), is one component of a  comprehensive MRSA colonization surveillance program. It is not intended to diagnose MRSA infection nor to guide or monitor treatment for MRSA infections. Performed at [facility], [place] [address], [place], [place] [address]    Culture, respiratory (non-expectorated)     Status: None   Collection Time: 09/01/20  1:50 PM   Specimen: Tracheal Aspirate; Respiratory  Result Value Ref Range Status   Specimen Description TRACHEAL ASPIRATE  Final   Special Requests NONE  Final   Gram Stain   Final    FEW WBC PRESENT,BOTH PMN AND MONONUCLEAR RARE GRAM POSITIVE COCCI    Culture   Final    Normal respiratory flora-no Staph aureus or Pseudomonas seen Performed at [facility], [place] [address], [place], [place] [address]    Report Status 09/04/2020 FINAL  Final  Culture, respiratory (non-expectorated)     Status: None   Collection Time: 09/11/20 10:44 AM   Specimen: Tracheal Aspirate; Respiratory  Result Value Ref Range Status   Specimen Description TRACHEAL ASPIRATE  Final   Special Requests NONE  Final   Gram Stain   Final    FEW WBC PRESENT,BOTH PMN AND MONONUCLEAR FEW GRAM POSITIVE COCCI IN PAIRS RARE YEAST    Culture   Final    Normal respiratory flora-no Staph aureus or Pseudomonas seen Performed at [facility], [address], [place], [place] [address]    Report Status 09/13/2020 FINAL  Final  Culture, bal-quantitative     Status: Abnormal   Collection Time: 09/15/20 10:31 AM   Specimen: Bronchoalveolar Lavage; Respiratory  Result Value Ref Range Status   Specimen Description BRONCHIAL ALVEOLAR LAVAGE  Final   Special Requests NONE  Final   Gram Stain   Final    ABUNDANT WBC PRESENT, PREDOMINANTLY PMN FEW SQUAMOUS EPITHELIAL CELLS PRESENT ABUNDANT GRAM NEGATIVE RODS FEW GRAM POSITIVE RODS RARE GRAM POSITIVE COCCI IN PAIRS    Culture (A)  Final    >=100,000 COLONIES/mL Normal respiratory flora-no Staph aureus or Pseudomonas seen Performed at [facility], [place] [address], [place], [place] [address]    Report Status 09/17/2020 FINAL  Final  Culture, bal-quantitative     Status: Abnormal   Collection Time: 09/15/20 10:31 AM   Specimen: Bronchoalveolar Lavage; Respiratory   Result Value Ref Range Status   Specimen Description BRONCHIAL ALVEOLAR LAVAGE  Final   Special Requests NONE  Final   Gram Stain   Final    FEW WBC PRESENT, PREDOMINANTLY PMN NO SQUAMOUS EPITHELIAL CELLS SEEN RARE GRAM VARIABLE ROD    Culture (A)  Final    10,000 COLONIES/mL Normal respiratory flora-no Staph aureus or Pseudomonas seen Performed at [facility], [address] [place],  [address]    Report Status 09/17/2020 FINAL  Final    Anti-infectives:  Anti-infectives (From admission, onward)   Start     Dose/Rate Route Frequency Ordered Stop   09/16/20 1000  ceFEPIme (MAXIPIME) 2 g in sodium chloride 0.9 % 100 mL IVPB  Status:  Discontinued        2 g 200 mL/hr over 30 Minutes Intravenous Every 8 hours 09/16/20 0924 09/17/20 1143   09/11/20 1030  ceFEPIme (MAXIPIME) 2 g in sodium chloride 0.9 % 100 mL IVPB        2 g 200 mL/hr over 30 Minutes Intravenous Every 8 hours 09/11/20 0922 09/16/20 0302   09/01/20 1000  ceFEPIme (MAXIPIME) 2 g in sodium chloride 0.9 % 100 mL  IVPB  Status:  Discontinued        2 g 200 mL/hr over 30 Minutes Intravenous Every 8 hours 09/01/20 0906 09/04/20 1114      Best Practice/Protocols:  VTE Prophylaxis: Lovenox (prophylaxtic dose) GI Prophylaxis: Proton Pump Inhibitor   Consults: Treatment Team:  Judith Part, MD    Studies: Repeat CXR today    Events: No acute events  Subjective:    Overnight Issues: NAEON  Objective:  Vital signs for last 24 hours: Temp:  [97.5 F (36.4 C)-98.1 F (36.7 C)] 97.8 F (36.6 C) (11/01 0800) Pulse Rate:  [45-60] 56 (11/01 0900) Resp:  [10-24] 21 (11/01 0900) BP: (84-132)/(57-75) 114/61 (11/01 0900) SpO2:  [84 %-99 %] 98 % (11/01 0900) FiO2 (%):  [50 %-60 %] 60 % (11/01 0830) Weight:  [116.1 kg] 116.1 kg (11/01 0500)  Hemodynamic parameters for last 24 hours:    Intake/Output from previous day: 10/31 0701 - 11/01 0700 In: 5195.8 [I.V.:2065.8; NG/GT:3130] Out:  2725 [Urine:2575; Stool:150]  Intake/Output this shift: Total I/O In: 560.1 [I.V.:170.1; NG/GT:390] Out: -   Vent settings for last 24 hours: Vent Mode: PRVC FiO2 (%):  [50 %-60 %] 60 % Set Rate:  [20 bmp] 20 bmp Vt Set:  [510 mL-600 mL] 510 mL PEEP:  [12 cmH20-14 cmH20] 14 cmH20 Plateau Pressure:  [22 cmH20-29 cmH20] 22 cmH20  Physical Exam:  General: WD, obese male who is laying in bed on the vent HEENT: head is normocephalic, atraumatic.  Sclera are noninjected.  PERRL.  Ears and nose without any masses or lesions. ETT tube present Heart: sinus bradycardia.  Normal s1,s2. No obvious murmurs, gallops, or rubs noted.  Palpable radial and pedal pulses bilaterally Lungs: some rhonchi bilaterally, vent on 60% and 14 of PEEP Abd: NT, distended, +BS, rectal pouch present with liquid brown stool MS: significant edema in BLE and hands Skin: warm and dry with no masses, lesions, or rashes Neuro: Quadriplegia, shoulder shrug 5/5 Psych: A&Ox3 with an appropriate affect.   Results for orders placed or performed during the hospital encounter of 08/24/2020 (from the past 24 hour(s))  Glucose, capillary     Status: Abnormal   Collection Time: 09/20/20 11:40 AM  Result Value Ref Range   Glucose-Capillary 102 (H) 70 - 99 mg/dL  Glucose, capillary     Status: Abnormal   Collection Time: 09/20/20  4:06 PM  Result Value Ref Range   Glucose-Capillary 110 (H) 70 - 99 mg/dL  Glucose, capillary     Status: Abnormal   Collection Time: 2020-10-16  8:22 AM  Result Value Ref Range   Glucose-Capillary 125 (H) 70 - 99 mg/dL    Assessment & Plan: Present on Admission: . Quadriparesis (HCC) MCC C31fwith quadriparesis- S/P C4-5 ACDF and C3-T2 PSIF 10/10 by Dr. OZada Finders Csf leak resolved Acute hypoxic ventilator dependent respiratory failure-weaning FiO2as tolerated, scheduled duonebs, chest physio/quad cough. Trace R PTX- no PTX onf/uCXR Pre-existing medical problems- restarted home  valproate, neurontin, synthroid, seroquel, effexor. Holding VPA/ser/effexor per family request. Bradycardia- this is due to his cord injury, 580s BP OK, no more neurogenic shock present Urinary retention-expected given quadriparesis, Q6 I/O Sacral pressure ulcer, stage 4- continue local wound care with hydrotherapy and pressure offloading. ABL anemia- repeat labs today  Hypernatremia - free water flushes to250 q4,D5W FEN- TF, bowel regimen. recheck labs  ID-AF, WBCnormalized before,s/p 7dempiricMaxipime,respcxx2 andBAL10/26,normal resp flora VTE- PAS, LMWH  Dispo- ICU. Give lasix, replace K empirically. Labs and CXR.    LOS: 22  days   Additional comments:I discussed the patient's plan of care with Dr. Grandville Silos .  Critical Care Total Time*: 33 Minutes  Georganna Skeans, MD, MPH, FACS Please use [email or web address] to contact on call provider   10/03/2020  *Care during the described time interval was provided by me. I have reviewed this patient's available data, including medical history, events of note, physical examination and test results as part of my evaluation.

## 2020-10-21 NOTE — Progress Notes (Signed)
Patient ID: Amaar Oshita, male   DOB: 12-Jun-1966, 54 y.o.   MRN: [id number] Reportedly, the patient desaturated and had a cuff leak.  Respiratory was attending him and it was determined he had bitten through his endotracheal tube.  He was expeditiously reintubated by anesthesia.  Unfortunately, he had a bradycardic arrest immediately thereafter.  Dr. Bedelia Person and Dr. Andrey Campanile were with the patient on my arrival.  On my arrival, he had undergone 20 minutes of CPR including code medications without ROSC.  An additional amp of epinephrine was circulated without ROSC.  There was no response and he was asystole on the monitor.  Time of death 65.  I called his daughter, Marchelle Folks, and it went directly to voicemail.  I did leave a message for her to call the unit.  I was able to get in touch with his daughter, Merry Proud, and let her know what had happened.  She expressed appreciation for all of the staff and everything that has been done to care for her father.  Violeta Gelinas, MD, MPH, FACS Please use [email or web address] to contact on call provider
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
## 2020-10-21 NOTE — Anesthesia Procedure Notes (Signed)
Procedure Name: Intubation Date/Time: 10-02-20 12:21 PM Performed by: Jodell Cipro, CRNA Pre-anesthesia Checklist: Patient identified, Emergency Drugs available, Suction available, Patient being monitored and Timeout performed Patient Re-evaluated:Patient Re-evaluated prior to induction Oxygen Delivery Method: Circle system utilized Preoxygenation: Pre-oxygenation with 100% oxygen Induction Type: IV induction, Rapid sequence and Cricoid Pressure applied Laryngoscope Size: Glidescope and 4 Grade View: Grade I Tube size: 8.0 mm Number of attempts: 1 Airway Equipment and Method: Rigid stylet and Video-laryngoscopy Placement Confirmation: ETT inserted through vocal cords under direct vision,  breath sounds checked- equal and bilateral and CO2 detector Secured at: 23 cm Tube secured with: Tape Dental Injury: Teeth and Oropharynx as per pre-operative assessment
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
## 2020-10-21 NOTE — Progress Notes (Signed)
PT not available for CPT. Will continue at next schedule time.

## 2020-10-21 NOTE — Progress Notes (Signed)
Pt was receiving Hydrotherapy around 1150. This RN was in the room with PT and had pt in a left side lying position. Pt started to desat with an audible cuff leak. Pt placed on 100% FiO2 and notified RTat 1154. After arrival, it was decided that we may need to change out ETT, thus we started bagging the pt. Dr. Janee Morn paged. He was scrubbed in in the OR, so Anesthesia came to quickly change out ETT. While obtaining the CXR to confirm tube placement, pt HR dropped to the 30s. I was in the room, so I called for Atropine and attempted to stimulate pt. HR subsequently went asystole, no palpable pulse. Chest compressions started immediately @1227 . [facility] [employer] called, ACLS algorithm initiated.(see [facility] Record)  Despite all efforts, ROSC was not obtained and Dr. called time of death at 1249. Family was notified-primary contact Janee Morn did not answer, but other daughter, Marchelle Folks did. Merry Proud called back at 1400 and was delivered the news of her father's passing. Support provided and questions answered.

## 2020-10-21 NOTE — Progress Notes (Addendum)
RN called RT to come and assess pt due to pt desaturation into the 70's. RN had placed pt on 100% FiO2 and audible cuff leak was present when RT came to bedside. RT placed air in pt's cuff with no improvement to oxygenation. Pt's ETT was at 24 at the lip, so RT advanced ETT to 28 at the lip. RN called MD to bedside. Dr. Bedelia Person along with anesthesia came to bedside. Anesthesia replaced pt's ETT with a new 8.0 ETT at 26 @ the lip. Placement was confirmed by good color change with CO2 detector along with BL breath sounds. X-ray came to bedside and before obtaining image pt brady down and loss pulses.

## 2020-10-21 DEATH — deceased
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
## 2022-07-18 IMAGING — DX DG CHEST 1V PORT
1 series · 1 of 1 positions shown · non-contrast
Comparison: 09/13/2020

CLINICAL DATA: Respiratory failure.

EXAM:
PORTABLE CHEST 1 VIEW

[chest ap]
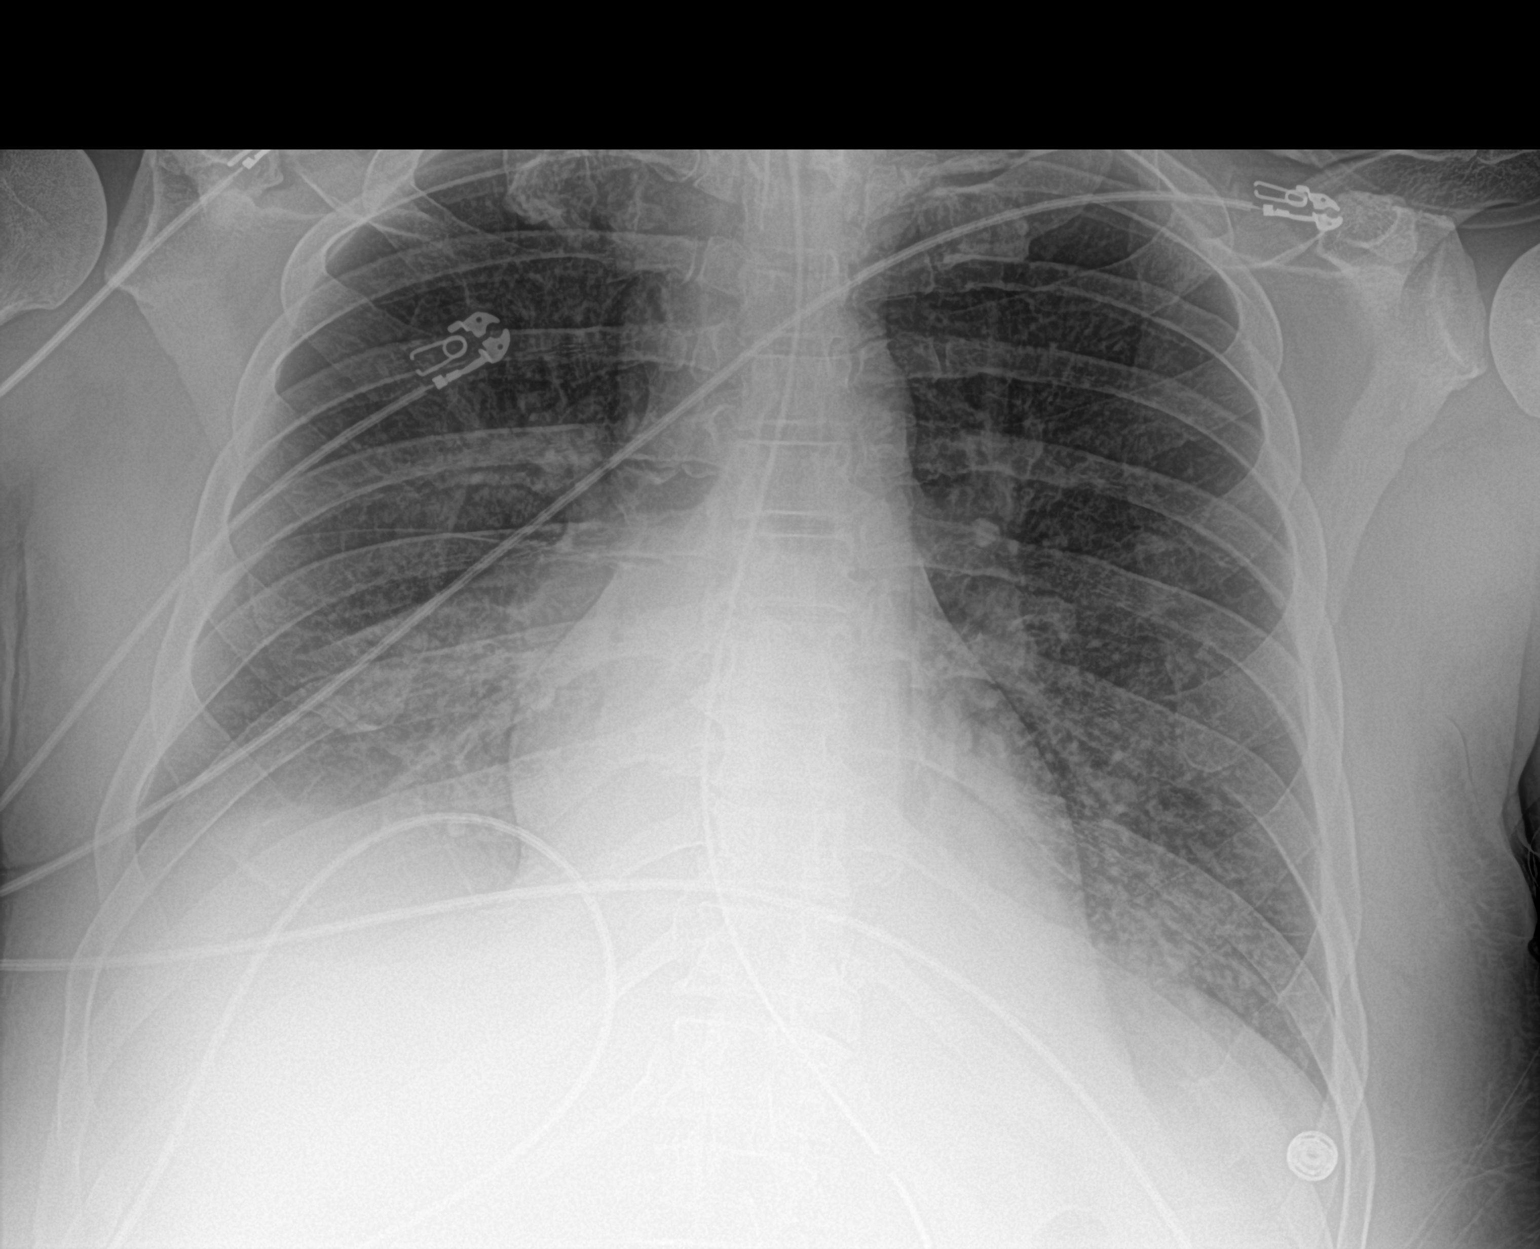

[1 of 1 positions shown; findings below may reference images not displayed]

FINDINGS: The endotracheal tube is approximately 6 cm above the carina. The NG
tube is coursing down the esophagus and into the stomach.

The cardiac silhouette, mediastinal and hilar contours are within
normal limits and stable. Persistent right pleural effusion without
definite pneumothorax.

Persistent interstitial process, likely pulmonary edema. Persistent
bibasilar infiltrates although the left lung base appears slightly
better aerated.
IMPRESSION: 1. Stable support apparatus.
2. Persistent interstitial process, likely pulmonary edema.
3. Persistent right pleural effusion and bibasilar infiltrates.
